# Patient Record
Sex: Female | Born: 1965 | Race: White | Hispanic: No | Marital: Married | Smoking: Never smoker
Health system: Southern US, Community
[De-identification: ages and names within clinical notes are randomized; demographics above are authoritative.]

## PROBLEM LIST (undated history)

## (undated) DIAGNOSIS — M549 Dorsalgia, unspecified: Secondary | ICD-10-CM

## (undated) DIAGNOSIS — G8929 Other chronic pain: Secondary | ICD-10-CM

## (undated) DIAGNOSIS — R011 Cardiac murmur, unspecified: Secondary | ICD-10-CM

## (undated) DIAGNOSIS — F339 Major depressive disorder, recurrent, unspecified: Secondary | ICD-10-CM

## (undated) DIAGNOSIS — F5101 Primary insomnia: Secondary | ICD-10-CM

## (undated) DIAGNOSIS — E669 Obesity, unspecified: Secondary | ICD-10-CM

## (undated) DIAGNOSIS — E785 Hyperlipidemia, unspecified: Secondary | ICD-10-CM

## (undated) DIAGNOSIS — K469 Unspecified abdominal hernia without obstruction or gangrene: Secondary | ICD-10-CM

## (undated) DIAGNOSIS — L709 Acne, unspecified: Secondary | ICD-10-CM

## (undated) DIAGNOSIS — M25569 Pain in unspecified knee: Secondary | ICD-10-CM

## (undated) DIAGNOSIS — H269 Unspecified cataract: Secondary | ICD-10-CM

## (undated) DIAGNOSIS — F411 Generalized anxiety disorder: Secondary | ICD-10-CM

## (undated) DIAGNOSIS — E119 Type 2 diabetes mellitus without complications: Secondary | ICD-10-CM

## (undated) DIAGNOSIS — I2510 Atherosclerotic heart disease of native coronary artery without angina pectoris: Secondary | ICD-10-CM

## (undated) DIAGNOSIS — M1990 Unspecified osteoarthritis, unspecified site: Secondary | ICD-10-CM

## (undated) DIAGNOSIS — K5900 Constipation, unspecified: Secondary | ICD-10-CM

## (undated) DIAGNOSIS — I10 Essential (primary) hypertension: Secondary | ICD-10-CM

## (undated) DIAGNOSIS — R7303 Prediabetes: Secondary | ICD-10-CM

## (undated) DIAGNOSIS — I209 Angina pectoris, unspecified: Secondary | ICD-10-CM

## (undated) DIAGNOSIS — Z9079 Acquired absence of other genital organ(s): Secondary | ICD-10-CM

## (undated) DIAGNOSIS — Z90710 Acquired absence of both cervix and uterus: Secondary | ICD-10-CM

## (undated) DIAGNOSIS — R0600 Dyspnea, unspecified: Secondary | ICD-10-CM

## (undated) HISTORY — PX: TONSILLECTOMY: SUR1361

## (undated) HISTORY — DX: Prediabetes: R73.03

## (undated) HISTORY — PX: HERNIA REPAIR: SHX5222

## (undated) HISTORY — DX: Dorsalgia, unspecified: M54.9

## (undated) HISTORY — DX: Acne, unspecified: L70.9

## (undated) HISTORY — DX: Unspecified abdominal hernia without obstruction or gangrene: K46.9

## (undated) HISTORY — DX: Generalized anxiety disorder: F41.1

## (undated) HISTORY — PX: PR TYMPANOSTOMY GENERAL ANESTHESIA: 69436

## (undated) HISTORY — DX: Other chronic pain: G89.29

## (undated) HISTORY — DX: Pain in unspecified knee: M25.569

## (undated) HISTORY — DX: Obesity, unspecified: E66.9

## (undated) HISTORY — PX: APPENDECTOMY: SHX54

## (undated) HISTORY — DX: Major depressive disorder, recurrent, unspecified: F33.9

## (undated) HISTORY — DX: Unspecified osteoarthritis, unspecified site: M19.90

## (undated) HISTORY — PX: PR APPENDECTOMY: 44950

## (undated) HISTORY — PX: PR LIG/TRNSXJ FLP TUBE ABDL/VAG APPR UNI/BI: 58600

## (undated) HISTORY — DX: Unspecified cataract: H26.9

## (undated) HISTORY — DX: Cardiac murmur, unspecified: R01.1

## (undated) MED ORDER — BUPROPION HCL ER (SR) 100 MG OR TB12
100.0000 mg | EXTENDED_RELEASE_TABLET | Freq: Two times a day (BID) | ORAL | 0 refills | Status: AC
Start: 2018-03-03 — End: ?

## (undated) MED ORDER — BUPROPION HCL ER (SR) 100 MG OR TB12
EXTENDED_RELEASE_TABLET | ORAL | 0 refills | Status: AC
Start: 2018-06-22 — End: ?

## (undated) MED ORDER — AMLODIPINE BESYLATE 10 MG OR TABS
ORAL_TABLET | ORAL | 0 refills | Status: AC
Start: 2023-03-23 — End: ?

## (undated) MED ORDER — ATORVASTATIN CALCIUM 40 MG OR TABS
40.0000 mg | ORAL_TABLET | Freq: Every day | ORAL | 0 refills | Status: AC
Start: 2024-04-19 — End: ?

---

## 1988-04-14 ENCOUNTER — Other Ambulatory Visit (HOSPITAL_BASED_OUTPATIENT_CLINIC_OR_DEPARTMENT_OTHER): Payer: Self-pay

## 1988-06-30 LAB — CERVICAL CANCER SCREENING

## 1988-12-15 ENCOUNTER — Other Ambulatory Visit (HOSPITAL_BASED_OUTPATIENT_CLINIC_OR_DEPARTMENT_OTHER): Payer: Self-pay

## 1988-12-24 LAB — CERVICAL CANCER SCREENING

## 1991-02-01 ENCOUNTER — Other Ambulatory Visit (HOSPITAL_BASED_OUTPATIENT_CLINIC_OR_DEPARTMENT_OTHER): Payer: Self-pay

## 1991-02-12 LAB — CERVICAL CANCER SCREENING

## 1992-02-01 ENCOUNTER — Other Ambulatory Visit (HOSPITAL_BASED_OUTPATIENT_CLINIC_OR_DEPARTMENT_OTHER): Payer: Self-pay

## 1992-02-07 LAB — CERVICAL CANCER SCREENING

## 1993-01-20 ENCOUNTER — Other Ambulatory Visit (HOSPITAL_BASED_OUTPATIENT_CLINIC_OR_DEPARTMENT_OTHER): Payer: Self-pay

## 1993-01-26 LAB — CERVICAL CANCER SCREENING

## 1994-02-16 ENCOUNTER — Other Ambulatory Visit (HOSPITAL_BASED_OUTPATIENT_CLINIC_OR_DEPARTMENT_OTHER): Payer: Self-pay

## 1994-02-25 LAB — CERVICAL CANCER SCREENING

## 1999-05-22 HISTORY — PX: EYE SURGERY: SHX253

## 2003-02-21 ENCOUNTER — Encounter: Payer: Self-pay | Admitting: Obstetrics & Gynecology

## 2003-03-13 ENCOUNTER — Ambulatory Visit: Payer: Medicaid Other

## 2003-03-13 ENCOUNTER — Encounter: Payer: Medicaid Other | Admitting: Obstetrics & Gynecology

## 2003-03-13 DIAGNOSIS — O09529 Supervision of elderly multigravida, unspecified trimester: Secondary | ICD-10-CM

## 2003-03-21 ENCOUNTER — Other Ambulatory Visit: Payer: Self-pay

## 2003-03-21 DIAGNOSIS — O09529 Supervision of elderly multigravida, unspecified trimester: Secondary | ICD-10-CM

## 2003-03-21 DIAGNOSIS — Q999 Chromosomal abnormality, unspecified: Secondary | ICD-10-CM

## 2003-03-22 ENCOUNTER — Other Ambulatory Visit (HOSPITAL_BASED_OUTPATIENT_CLINIC_OR_DEPARTMENT_OTHER): Payer: Self-pay | Admitting: MS"

## 2003-03-27 ENCOUNTER — Other Ambulatory Visit (HOSPITAL_BASED_OUTPATIENT_CLINIC_OR_DEPARTMENT_OTHER): Payer: Self-pay | Admitting: Obstetrics & Gynecology

## 2003-03-27 ENCOUNTER — Encounter: Payer: Medicare Other | Admitting: Obstetrics & Gynecology

## 2003-03-27 DIAGNOSIS — R87613 High grade squamous intraepithelial lesion on cytologic smear of cervix (HGSIL): Secondary | ICD-10-CM

## 2003-03-27 DIAGNOSIS — Z331 Pregnant state, incidental: Secondary | ICD-10-CM

## 2003-03-27 DIAGNOSIS — O09529 Supervision of elderly multigravida, unspecified trimester: Secondary | ICD-10-CM

## 2003-04-03 LAB — CYTOGENETICS

## 2003-04-17 ENCOUNTER — Encounter: Payer: Medicare Other | Admitting: Obstetrics & Gynecology

## 2003-04-17 DIAGNOSIS — O99340 Other mental disorders complicating pregnancy, unspecified trimester: Secondary | ICD-10-CM

## 2003-04-17 DIAGNOSIS — O09529 Supervision of elderly multigravida, unspecified trimester: Secondary | ICD-10-CM

## 2003-05-23 ENCOUNTER — Encounter: Payer: Medicaid Other | Admitting: Obstetrics & Gynecology

## 2003-05-23 DIAGNOSIS — IMO0002 Reserved for concepts with insufficient information to code with codable children: Secondary | ICD-10-CM

## 2003-05-23 DIAGNOSIS — O99340 Other mental disorders complicating pregnancy, unspecified trimester: Secondary | ICD-10-CM

## 2003-05-23 DIAGNOSIS — O09529 Supervision of elderly multigravida, unspecified trimester: Secondary | ICD-10-CM

## 2003-06-06 ENCOUNTER — Encounter: Payer: Self-pay | Admitting: Obstetrics & Gynecology

## 2003-06-06 ENCOUNTER — Encounter: Payer: Medicare Other | Admitting: Obstetrics & Gynecology

## 2003-06-06 ENCOUNTER — Other Ambulatory Visit: Payer: Medicare Other

## 2003-06-06 DIAGNOSIS — O3660X0 Maternal care for excessive fetal growth, unspecified trimester, not applicable or unspecified: Secondary | ICD-10-CM

## 2003-06-20 ENCOUNTER — Encounter: Payer: Medicare Other | Admitting: Obstetrics & Gynecology

## 2003-06-20 DIAGNOSIS — N879 Dysplasia of cervix uteri, unspecified: Secondary | ICD-10-CM

## 2003-06-20 DIAGNOSIS — O09529 Supervision of elderly multigravida, unspecified trimester: Secondary | ICD-10-CM

## 2003-06-24 DIAGNOSIS — R87613 High grade squamous intraepithelial lesion on cytologic smear of cervix (HGSIL): Secondary | ICD-10-CM

## 2003-06-24 DIAGNOSIS — Z331 Pregnant state, incidental: Secondary | ICD-10-CM

## 2003-06-26 ENCOUNTER — Encounter: Payer: Medicare Other | Admitting: Obstetrics & Gynecology

## 2003-06-26 DIAGNOSIS — O26839 Pregnancy related renal disease, unspecified trimester: Secondary | ICD-10-CM

## 2003-07-04 ENCOUNTER — Encounter: Payer: Medicare Other | Admitting: Obstetrics/Gynecology

## 2003-07-09 ENCOUNTER — Encounter (HOSPITAL_COMMUNITY): Payer: Medicare Other | Admitting: Maternal & Fetal Medicine

## 2003-07-09 DIAGNOSIS — O09529 Supervision of elderly multigravida, unspecified trimester: Secondary | ICD-10-CM

## 2003-07-10 ENCOUNTER — Encounter: Payer: Self-pay | Admitting: Obstetrics & Gynecology

## 2003-07-17 ENCOUNTER — Encounter: Payer: Self-pay | Admitting: Obstetrics & Gynecology

## 2003-07-26 ENCOUNTER — Encounter: Payer: Medicare Other | Admitting: Obstetrics & Gynecology

## 2003-08-07 ENCOUNTER — Encounter: Payer: Self-pay | Admitting: Obstetrics & Gynecology

## 2003-08-14 ENCOUNTER — Encounter: Payer: Self-pay | Admitting: Obstetrics & Gynecology

## 2003-08-23 ENCOUNTER — Encounter: Payer: Medicare Other | Admitting: Obstetrics/Gynecology

## 2003-08-26 ENCOUNTER — Telehealth: Payer: Medicare Other

## 2003-08-28 ENCOUNTER — Ambulatory Visit: Payer: Medicare Other | Admitting: Obstetrics & Gynecology

## 2003-08-28 ENCOUNTER — Other Ambulatory Visit (HOSPITAL_BASED_OUTPATIENT_CLINIC_OR_DEPARTMENT_OTHER): Payer: Self-pay | Admitting: Obstetrics & Gynecology

## 2003-08-28 DIAGNOSIS — R87613 High grade squamous intraepithelial lesion on cytologic smear of cervix (HGSIL): Secondary | ICD-10-CM

## 2003-08-29 ENCOUNTER — Ambulatory Visit: Payer: Medicare Other

## 2003-08-29 DIAGNOSIS — Z641 Problems related to multiparity: Secondary | ICD-10-CM

## 2003-08-29 DIAGNOSIS — Z302 Encounter for sterilization: Secondary | ICD-10-CM

## 2003-08-30 ENCOUNTER — Encounter: Payer: Self-pay | Admitting: Obstetrics & Gynecology

## 2003-09-20 ENCOUNTER — Encounter: Payer: Self-pay | Admitting: Obstetrics & Gynecology

## 2003-10-04 ENCOUNTER — Encounter: Payer: Self-pay | Admitting: Obstetrics & Gynecology

## 2003-10-16 ENCOUNTER — Encounter: Payer: Self-pay | Admitting: Obstetrics & Gynecology

## 2004-02-03 ENCOUNTER — Other Ambulatory Visit (HOSPITAL_BASED_OUTPATIENT_CLINIC_OR_DEPARTMENT_OTHER): Payer: Self-pay | Admitting: Obstetrics & Gynecology

## 2004-02-03 DIAGNOSIS — R87613 High grade squamous intraepithelial lesion on cytologic smear of cervix (HGSIL): Secondary | ICD-10-CM

## 2004-02-03 DIAGNOSIS — Z124 Encounter for screening for malignant neoplasm of cervix: Secondary | ICD-10-CM

## 2004-02-03 DIAGNOSIS — N879 Dysplasia of cervix uteri, unspecified: Secondary | ICD-10-CM

## 2004-02-04 LAB — PATHOLOGY, SURGICAL

## 2005-04-07 ENCOUNTER — Encounter: Payer: Medicare Other | Admitting: Obstetrics & Gynecology

## 2005-05-12 ENCOUNTER — Other Ambulatory Visit (HOSPITAL_BASED_OUTPATIENT_CLINIC_OR_DEPARTMENT_OTHER): Payer: Self-pay | Admitting: Obstetrics & Gynecology

## 2005-05-12 ENCOUNTER — Encounter: Payer: Medicare Other | Admitting: Obstetrics & Gynecology

## 2005-05-12 DIAGNOSIS — Z01419 Encounter for gynecological examination (general) (routine) without abnormal findings: Secondary | ICD-10-CM

## 2005-05-12 DIAGNOSIS — N72 Inflammatory disease of cervix uteri: Secondary | ICD-10-CM

## 2005-05-12 DIAGNOSIS — R87610 Atypical squamous cells of undetermined significance on cytologic smear of cervix (ASC-US): Secondary | ICD-10-CM

## 2005-05-12 DIAGNOSIS — R87810 Cervical high risk human papillomavirus (HPV) DNA test positive: Secondary | ICD-10-CM

## 2005-05-19 LAB — PATHOLOGY, SURGICAL

## 2005-05-26 LAB — CERVICAL CANCER SCREENING: Cytologic Impression: UNDETERMINED — AB

## 2005-06-09 ENCOUNTER — Encounter: Payer: Medicare Other | Admitting: Obstetrics & Gynecology

## 2005-06-09 DIAGNOSIS — R87610 Atypical squamous cells of undetermined significance on cytologic smear of cervix (ASC-US): Secondary | ICD-10-CM

## 2005-06-09 DIAGNOSIS — N946 Dysmenorrhea, unspecified: Secondary | ICD-10-CM

## 2005-06-09 DIAGNOSIS — E669 Obesity, unspecified: Secondary | ICD-10-CM

## 2005-06-09 DIAGNOSIS — N920 Excessive and frequent menstruation with regular cycle: Secondary | ICD-10-CM

## 2005-06-10 ENCOUNTER — Encounter: Payer: Medicare Other | Admitting: Registered Nurse

## 2005-08-27 ENCOUNTER — Encounter: Payer: Medicare Other | Admitting: Obstetrics & Gynecology

## 2005-08-27 ENCOUNTER — Encounter: Payer: Medicare Other | Admitting: Registered Nurse

## 2005-08-27 DIAGNOSIS — R87610 Atypical squamous cells of undetermined significance on cytologic smear of cervix (ASC-US): Secondary | ICD-10-CM

## 2005-11-01 ENCOUNTER — Encounter: Payer: Medicare Other | Admitting: Obstetrics & Gynecology

## 2005-12-06 ENCOUNTER — Encounter: Payer: Medicare Other | Admitting: Obstetrics & Gynecology

## 2005-12-06 ENCOUNTER — Other Ambulatory Visit (HOSPITAL_BASED_OUTPATIENT_CLINIC_OR_DEPARTMENT_OTHER): Payer: Self-pay | Admitting: Obstetrics & Gynecology

## 2005-12-06 DIAGNOSIS — N760 Acute vaginitis: Secondary | ICD-10-CM

## 2005-12-06 DIAGNOSIS — R87615 Unsatisfactory cytologic smear of cervix: Secondary | ICD-10-CM

## 2005-12-15 LAB — CERVICAL CANCER SCREENING: Cytologic Impression: NEGATIVE

## 2005-12-21 ENCOUNTER — Encounter: Payer: Self-pay | Admitting: Registered Nurse

## 2006-02-07 ENCOUNTER — Encounter: Payer: Medicare Other | Admitting: Registered Nurse

## 2006-02-08 ENCOUNTER — Encounter: Payer: Medicare Other | Admitting: Registered Nurse

## 2006-04-29 DIAGNOSIS — L299 Pruritus, unspecified: Secondary | ICD-10-CM

## 2006-04-29 DIAGNOSIS — R21 Rash and other nonspecific skin eruption: Secondary | ICD-10-CM

## 2006-05-09 ENCOUNTER — Encounter: Payer: Medicare Other | Admitting: Registered Nurse

## 2006-06-07 ENCOUNTER — Encounter: Payer: Medicare Other | Admitting: Registered Nurse

## 2006-06-17 ENCOUNTER — Encounter: Payer: Medicare Other | Admitting: Dermatology

## 2006-06-17 DIAGNOSIS — B86 Scabies: Secondary | ICD-10-CM

## 2006-07-01 ENCOUNTER — Encounter: Payer: Medicare Other | Admitting: Dermatology

## 2006-07-29 DIAGNOSIS — L989 Disorder of the skin and subcutaneous tissue, unspecified: Secondary | ICD-10-CM

## 2006-10-11 ENCOUNTER — Encounter (HOSPITAL_BASED_OUTPATIENT_CLINIC_OR_DEPARTMENT_OTHER): Payer: Medicare Other | Admitting: Registered Nurse

## 2006-11-29 ENCOUNTER — Encounter (HOSPITAL_BASED_OUTPATIENT_CLINIC_OR_DEPARTMENT_OTHER): Payer: Medicare Other | Admitting: Registered Nurse

## 2006-12-19 ENCOUNTER — Encounter (HOSPITAL_BASED_OUTPATIENT_CLINIC_OR_DEPARTMENT_OTHER): Payer: Medicare Other | Admitting: Registered Nurse

## 2007-02-07 ENCOUNTER — Encounter (HOSPITAL_BASED_OUTPATIENT_CLINIC_OR_DEPARTMENT_OTHER): Payer: Medicare Other | Admitting: Registered Nurse

## 2007-02-10 ENCOUNTER — Encounter (HOSPITAL_BASED_OUTPATIENT_CLINIC_OR_DEPARTMENT_OTHER): Payer: Medicare Other

## 2007-02-10 DIAGNOSIS — S86819A Strain of other muscle(s) and tendon(s) at lower leg level, unspecified leg, initial encounter: Secondary | ICD-10-CM

## 2007-02-10 DIAGNOSIS — M7650 Patellar tendinitis, unspecified knee: Secondary | ICD-10-CM

## 2007-02-10 DIAGNOSIS — M9420 Chondromalacia, unspecified site: Secondary | ICD-10-CM

## 2007-05-11 ENCOUNTER — Encounter (HOSPITAL_BASED_OUTPATIENT_CLINIC_OR_DEPARTMENT_OTHER): Payer: Medicare Other

## 2007-05-11 DIAGNOSIS — M25569 Pain in unspecified knee: Secondary | ICD-10-CM

## 2007-06-22 ENCOUNTER — Ambulatory Visit (EMERGENCY_DEPARTMENT_HOSPITAL): Payer: Medicare Other

## 2007-09-08 ENCOUNTER — Encounter (HOSPITAL_BASED_OUTPATIENT_CLINIC_OR_DEPARTMENT_OTHER): Payer: Medicare Other | Admitting: Registered Nurse

## 2007-09-08 ENCOUNTER — Other Ambulatory Visit (HOSPITAL_BASED_OUTPATIENT_CLINIC_OR_DEPARTMENT_OTHER): Payer: Self-pay | Admitting: Registered Nurse

## 2007-09-08 DIAGNOSIS — Z1231 Encounter for screening mammogram for malignant neoplasm of breast: Secondary | ICD-10-CM

## 2007-09-15 LAB — CERVICAL CANCER SCREENING: Cytologic Impression: NEGATIVE

## 2007-09-27 ENCOUNTER — Encounter (HOSPITAL_BASED_OUTPATIENT_CLINIC_OR_DEPARTMENT_OTHER): Payer: Medicare Other | Admitting: Registered Nurse

## 2007-10-03 ENCOUNTER — Encounter (HOSPITAL_BASED_OUTPATIENT_CLINIC_OR_DEPARTMENT_OTHER): Payer: Medicare Other | Admitting: Internal Medicine

## 2007-10-03 DIAGNOSIS — R059 Cough, unspecified: Secondary | ICD-10-CM

## 2007-10-31 ENCOUNTER — Encounter (HOSPITAL_BASED_OUTPATIENT_CLINIC_OR_DEPARTMENT_OTHER): Payer: Medicare Other | Admitting: Dermatology

## 2007-12-27 ENCOUNTER — Encounter (HOSPITAL_BASED_OUTPATIENT_CLINIC_OR_DEPARTMENT_OTHER): Payer: Medicare Other | Admitting: Dermatology

## 2008-03-01 ENCOUNTER — Encounter (HOSPITAL_BASED_OUTPATIENT_CLINIC_OR_DEPARTMENT_OTHER): Payer: Medicare Other

## 2008-03-01 DIAGNOSIS — L851 Acquired keratosis [keratoderma] palmaris et plantaris: Secondary | ICD-10-CM

## 2008-04-08 ENCOUNTER — Encounter (HOSPITAL_BASED_OUTPATIENT_CLINIC_OR_DEPARTMENT_OTHER): Payer: Medicare Other | Admitting: Naturopath

## 2008-04-09 ENCOUNTER — Encounter (HOSPITAL_BASED_OUTPATIENT_CLINIC_OR_DEPARTMENT_OTHER): Payer: Medicare Other | Admitting: Dermatology

## 2008-04-10 ENCOUNTER — Encounter (HOSPITAL_BASED_OUTPATIENT_CLINIC_OR_DEPARTMENT_OTHER): Payer: Self-pay | Admitting: Naturopath

## 2008-05-13 ENCOUNTER — Encounter (HOSPITAL_BASED_OUTPATIENT_CLINIC_OR_DEPARTMENT_OTHER): Payer: Medicare Other | Admitting: Naturopath

## 2008-05-13 ENCOUNTER — Ambulatory Visit (EMERGENCY_DEPARTMENT_HOSPITAL): Payer: Medicare Other

## 2008-05-22 ENCOUNTER — Encounter (HOSPITAL_BASED_OUTPATIENT_CLINIC_OR_DEPARTMENT_OTHER): Payer: Medicare Other | Admitting: Dermatology

## 2008-06-04 ENCOUNTER — Encounter (HOSPITAL_BASED_OUTPATIENT_CLINIC_OR_DEPARTMENT_OTHER): Payer: Medicare Other | Admitting: Dermatology

## 2008-06-25 ENCOUNTER — Encounter (HOSPITAL_BASED_OUTPATIENT_CLINIC_OR_DEPARTMENT_OTHER): Payer: Medicare Other | Admitting: Dermatology

## 2008-06-25 DIAGNOSIS — D235 Other benign neoplasm of skin of trunk: Secondary | ICD-10-CM

## 2008-06-25 DIAGNOSIS — L259 Unspecified contact dermatitis, unspecified cause: Secondary | ICD-10-CM

## 2008-12-09 ENCOUNTER — Encounter (HOSPITAL_BASED_OUTPATIENT_CLINIC_OR_DEPARTMENT_OTHER): Payer: Medicare Other

## 2008-12-09 DIAGNOSIS — N390 Urinary tract infection, site not specified: Secondary | ICD-10-CM

## 2009-01-21 ENCOUNTER — Encounter (HOSPITAL_BASED_OUTPATIENT_CLINIC_OR_DEPARTMENT_OTHER): Payer: Medicare Other | Admitting: Naturopath

## 2009-01-29 ENCOUNTER — Ambulatory Visit (HOSPITAL_BASED_OUTPATIENT_CLINIC_OR_DEPARTMENT_OTHER): Payer: Medicare Other | Admitting: Naturopath

## 2009-01-30 ENCOUNTER — Ambulatory Visit: Payer: Medicare Other | Attending: Naturopath | Admitting: Naturopath

## 2009-02-27 ENCOUNTER — Encounter (HOSPITAL_BASED_OUTPATIENT_CLINIC_OR_DEPARTMENT_OTHER): Payer: Medicare Other | Admitting: Naturopath

## 2009-03-10 ENCOUNTER — Encounter (HOSPITAL_BASED_OUTPATIENT_CLINIC_OR_DEPARTMENT_OTHER): Payer: Medicare Other | Admitting: Naturopath

## 2009-03-12 ENCOUNTER — Ambulatory Visit: Payer: Medicare Other | Attending: Naturopath | Admitting: Naturopath

## 2009-03-27 ENCOUNTER — Encounter (HOSPITAL_BASED_OUTPATIENT_CLINIC_OR_DEPARTMENT_OTHER): Payer: Medicare Other | Admitting: Naturopath

## 2009-04-03 ENCOUNTER — Encounter (HOSPITAL_BASED_OUTPATIENT_CLINIC_OR_DEPARTMENT_OTHER): Payer: Medicare Other | Admitting: Naturopath

## 2009-04-08 ENCOUNTER — Encounter (HOSPITAL_BASED_OUTPATIENT_CLINIC_OR_DEPARTMENT_OTHER): Payer: Medicare Other | Admitting: Naturopath

## 2009-04-10 ENCOUNTER — Encounter (HOSPITAL_BASED_OUTPATIENT_CLINIC_OR_DEPARTMENT_OTHER): Payer: Medicare Other | Admitting: Naturopath

## 2009-05-05 ENCOUNTER — Encounter (HOSPITAL_BASED_OUTPATIENT_CLINIC_OR_DEPARTMENT_OTHER): Payer: Medicare Other | Admitting: Naturopath

## 2009-05-08 ENCOUNTER — Encounter (HOSPITAL_BASED_OUTPATIENT_CLINIC_OR_DEPARTMENT_OTHER): Payer: Medicare Other | Admitting: Naturopath

## 2009-05-12 ENCOUNTER — Ambulatory Visit (HOSPITAL_BASED_OUTPATIENT_CLINIC_OR_DEPARTMENT_OTHER): Payer: Medicare Other | Admitting: Naturopath

## 2009-05-20 ENCOUNTER — Ambulatory Visit: Payer: Medicare Other | Attending: Naturopath | Admitting: Naturopath

## 2009-11-09 ENCOUNTER — Emergency Department
Admission: EM | Admit: 2009-11-09 | Discharge: 2009-11-09 | Disposition: A | Discharge status: Home / Self Care | Payer: Medicare Other | Attending: Registered Nurse | Admitting: Registered Nurse

## 2009-11-09 DIAGNOSIS — M542 Cervicalgia: Secondary | ICD-10-CM | POA: Insufficient documentation

## 2009-11-09 DIAGNOSIS — Y33XXXA Other specified events, undetermined intent, initial encounter: Secondary | ICD-10-CM | POA: Insufficient documentation

## 2009-11-09 DIAGNOSIS — S139XXA Sprain of joints and ligaments of unspecified parts of neck, initial encounter: Secondary | ICD-10-CM | POA: Insufficient documentation

## 2009-11-20 ENCOUNTER — Encounter (HOSPITAL_BASED_OUTPATIENT_CLINIC_OR_DEPARTMENT_OTHER): Payer: Medicare Other

## 2009-11-20 ENCOUNTER — Encounter (HOSPITAL_BASED_OUTPATIENT_CLINIC_OR_DEPARTMENT_OTHER): Payer: Medicare Other | Admitting: Registered Nurse

## 2009-11-20 ENCOUNTER — Emergency Department
Admission: EM | Admit: 2009-11-20 | Discharge: 2009-11-21 | Disposition: A | Discharge status: Home / Self Care | Payer: Medicare Other | Attending: Emergency Medicine | Admitting: Emergency Medicine

## 2009-11-20 DIAGNOSIS — J069 Acute upper respiratory infection, unspecified: Secondary | ICD-10-CM | POA: Insufficient documentation

## 2009-11-20 DIAGNOSIS — R059 Cough, unspecified: Secondary | ICD-10-CM | POA: Insufficient documentation

## 2009-11-20 DIAGNOSIS — H6690 Otitis media, unspecified, unspecified ear: Secondary | ICD-10-CM | POA: Insufficient documentation

## 2009-11-21 ENCOUNTER — Encounter (HOSPITAL_BASED_OUTPATIENT_CLINIC_OR_DEPARTMENT_OTHER): Payer: Medicare Other

## 2009-11-21 ENCOUNTER — Other Ambulatory Visit (HOSPITAL_BASED_OUTPATIENT_CLINIC_OR_DEPARTMENT_OTHER): Payer: Self-pay | Admitting: Emergency Medicine

## 2009-11-21 DIAGNOSIS — R059 Cough, unspecified: Secondary | ICD-10-CM

## 2009-11-21 LAB — X-RAY CHEST 2 VW

## 2009-11-27 ENCOUNTER — Encounter (HOSPITAL_BASED_OUTPATIENT_CLINIC_OR_DEPARTMENT_OTHER): Payer: Medicare Other | Admitting: Naturopath

## 2009-12-01 ENCOUNTER — Encounter (HOSPITAL_BASED_OUTPATIENT_CLINIC_OR_DEPARTMENT_OTHER): Payer: Medicare Other | Admitting: Naturopath

## 2009-12-02 ENCOUNTER — Encounter (HOSPITAL_BASED_OUTPATIENT_CLINIC_OR_DEPARTMENT_OTHER): Payer: Medicare Other | Admitting: Naturopath

## 2009-12-08 ENCOUNTER — Encounter (HOSPITAL_BASED_OUTPATIENT_CLINIC_OR_DEPARTMENT_OTHER): Payer: Medicare Other | Admitting: Naturopath

## 2009-12-11 ENCOUNTER — Encounter (HOSPITAL_BASED_OUTPATIENT_CLINIC_OR_DEPARTMENT_OTHER): Payer: Medicare Other | Admitting: Naturopath

## 2009-12-16 ENCOUNTER — Ambulatory Visit: Payer: Medicare Other | Attending: Naturopath | Admitting: Naturopath

## 2009-12-16 DIAGNOSIS — M25569 Pain in unspecified knee: Secondary | ICD-10-CM | POA: Insufficient documentation

## 2009-12-16 DIAGNOSIS — J069 Acute upper respiratory infection, unspecified: Secondary | ICD-10-CM | POA: Insufficient documentation

## 2009-12-16 DIAGNOSIS — M542 Cervicalgia: Secondary | ICD-10-CM | POA: Insufficient documentation

## 2009-12-16 DIAGNOSIS — F3289 Other specified depressive episodes: Secondary | ICD-10-CM | POA: Insufficient documentation

## 2010-03-30 ENCOUNTER — Ambulatory Visit (HOSPITAL_BASED_OUTPATIENT_CLINIC_OR_DEPARTMENT_OTHER): Admit: 2010-03-30 | Discharge: 2010-03-30 | Disposition: A | Discharge status: Home / Self Care | Payer: Self-pay

## 2010-04-02 ENCOUNTER — Ambulatory Visit (HOSPITAL_BASED_OUTPATIENT_CLINIC_OR_DEPARTMENT_OTHER): Payer: Medicare Other | Admitting: Naturopath

## 2010-04-03 ENCOUNTER — Ambulatory Visit (HOSPITAL_BASED_OUTPATIENT_CLINIC_OR_DEPARTMENT_OTHER): Payer: Medicare Other

## 2010-05-21 ENCOUNTER — Ambulatory Visit (HOSPITAL_BASED_OUTPATIENT_CLINIC_OR_DEPARTMENT_OTHER): Payer: Medicare Other

## 2010-05-21 ENCOUNTER — Ambulatory Visit (HOSPITAL_BASED_OUTPATIENT_CLINIC_OR_DEPARTMENT_OTHER): Payer: Medicare Other | Admitting: Naturopath

## 2010-06-09 ENCOUNTER — Ambulatory Visit (HOSPITAL_BASED_OUTPATIENT_CLINIC_OR_DEPARTMENT_OTHER): Payer: Medicare Other | Admitting: Naturopath

## 2010-06-09 ENCOUNTER — Ambulatory Visit (HOSPITAL_BASED_OUTPATIENT_CLINIC_OR_DEPARTMENT_OTHER): Payer: Medicare Other

## 2010-06-17 ENCOUNTER — Ambulatory Visit (HOSPITAL_BASED_OUTPATIENT_CLINIC_OR_DEPARTMENT_OTHER): Payer: Medicare Other | Admitting: Naturopath

## 2010-06-17 ENCOUNTER — Ambulatory Visit (HOSPITAL_BASED_OUTPATIENT_CLINIC_OR_DEPARTMENT_OTHER): Payer: Medicare Other

## 2010-06-18 ENCOUNTER — Ambulatory Visit: Payer: Medicare Other | Attending: Naturopath | Admitting: Naturopath

## 2010-06-18 ENCOUNTER — Other Ambulatory Visit (HOSPITAL_BASED_OUTPATIENT_CLINIC_OR_DEPARTMENT_OTHER): Payer: Self-pay | Admitting: Naturopath

## 2010-06-18 ENCOUNTER — Ambulatory Visit (HOSPITAL_BASED_OUTPATIENT_CLINIC_OR_DEPARTMENT_OTHER): Payer: Medicare Other

## 2010-06-18 DIAGNOSIS — R87810 Cervical high risk human papillomavirus (HPV) DNA test positive: Secondary | ICD-10-CM | POA: Insufficient documentation

## 2010-06-18 DIAGNOSIS — N760 Acute vaginitis: Secondary | ICD-10-CM | POA: Insufficient documentation

## 2010-06-18 DIAGNOSIS — B9689 Other specified bacterial agents as the cause of diseases classified elsewhere: Secondary | ICD-10-CM | POA: Insufficient documentation

## 2010-06-18 DIAGNOSIS — Z1231 Encounter for screening mammogram for malignant neoplasm of breast: Secondary | ICD-10-CM

## 2010-06-18 DIAGNOSIS — Z0000 Encounter for general adult medical examination without abnormal findings: Secondary | ICD-10-CM | POA: Insufficient documentation

## 2010-06-18 DIAGNOSIS — Z6841 Body Mass Index (BMI) 40.0 and over, adult: Secondary | ICD-10-CM | POA: Insufficient documentation

## 2010-06-18 DIAGNOSIS — Z01419 Encounter for gynecological examination (general) (routine) without abnormal findings: Secondary | ICD-10-CM | POA: Insufficient documentation

## 2010-06-19 LAB — CERVICAL CANCER SCREENING: Cytologic Impression: NEGATIVE

## 2010-06-24 LAB — HPV ONLY

## 2010-06-29 LAB — SCREENING MAMMOGRAPHY BILATERAL

## 2010-07-03 ENCOUNTER — Emergency Department
Admission: EM | Admit: 2010-07-03 | Discharge: 2010-07-03 | Disposition: A | Discharge status: Home / Self Care | Payer: Medicare Other | Attending: Acute Care | Admitting: Acute Care

## 2010-07-03 DIAGNOSIS — H6120 Impacted cerumen, unspecified ear: Secondary | ICD-10-CM | POA: Insufficient documentation

## 2010-07-23 ENCOUNTER — Telehealth (HOSPITAL_BASED_OUTPATIENT_CLINIC_OR_DEPARTMENT_OTHER): Payer: Self-pay

## 2010-07-23 NOTE — Telephone Encounter (Signed)
A normal test result letter was sent 06/26/2010.  Patient is aware.

## 2010-07-23 NOTE — Telephone Encounter (Signed)
Message copied by Elliot Cousin on Thu Jul 23, 2010  1:44 PM  ------       Message from: Darnelle Spangle E       Created: Thu Jul 23, 2010 12:57 PM       Regarding: c/keehn....FW: C/KEEHN       Contact: [phone]                       ----- Message -----          From: Lonn Georgia He          Sent: 07/23/2010  12:45 PM            To: Marquis Lunch [facility] Pss Pool       Subject: C/KEEHN                                                    CONFIRMED PHONE NUMBER: [phone]       CALLERS FIRST AND LAST NAME: Carolanne Grumbling       FACILITY NAME: na TITLE: na       CALLERS RELATIONSHIP:Self       RETURN CALL: Detailed message on voicemail only              SUBJECT: General Message        REASON FOR REQUEST: Patient is calling to find out about the letter that was sent to her. She states there is a Psychiatric nurse that is being held at her local post office, from [facility]. [facility] is the only clinic she is a patient at. Before she goes to pick it up, she would like to know what it contains. Thank you. -Maggie x [phone]              MESSAGE: See above       FORWARD TO: [facility]

## 2010-08-25 ENCOUNTER — Telehealth (HOSPITAL_BASED_OUTPATIENT_CLINIC_OR_DEPARTMENT_OTHER): Payer: Self-pay

## 2010-08-25 NOTE — Telephone Encounter (Signed)
Message copied by Elliot Cousin on Tue Aug 25, 2010  9:21 AM  ------       Message from: Celedonio Savage       Created: Mon Aug 24, 2010  4:38 PM       Regarding: [name] FW: [facility]/keehn/medication        Contact: [phone]                       ----- Message -----          From: Tamala Ser          Sent: 08/24/2010   4:17 PM            To: [name] Rn Pool       Subject: [facility]/keehn/medication                CONFIRMED PHONE NUMBER: Telephone Information:       Home Phone      [phone]       Work Phone      [phone]       Mobile          [phone]       Work Phone      [phone]       CALLERS FIRST AND LAST NAME: Holly Blair       FACILITY NAME: n/a TITLE: n/a       CALLERS RELATIONSHIP:Self       RETURN CALL: Detailed message on voicemail only              SUBJECT: General Message        REASON FOR REQUEST: refill              MESSAGE: patient needs a refill of Prozac to be sent to the pharmacy at [facility], ph: [phone]       FORWARD TO: Carney Bern

## 2010-08-25 NOTE — Telephone Encounter (Signed)
Holly Blair takes prozac 30 mg daily. She has been on this dose for one year and it is working well. Last PHV was 06/18/10 with Holly Blair.  It is confusing to me whether she is on prozac 30 mg daily or prozac 20 mg daily. Holly Blair's note form 11/2009 says prozac 20 mg daily and not from 05/2010 says 30 mg daily.  [facility] says she picked up 20 mg daily on June 2011. Holly Blair says she takes one pill daily.  Holly Blair will check her pill bottle when she gets home and give me a call.  Pharmacy confirmed.

## 2010-08-26 MED ORDER — FLUOXETINE HCL 20 MG OR CAPS
ORAL_CAPSULE | ORAL | Status: DC
Start: 2010-08-26 — End: 2010-08-26

## 2010-08-26 MED ORDER — FLUOXETINE HCL 20 MG OR CAPS
ORAL_CAPSULE | ORAL | Status: DC
Start: 2010-08-26 — End: 2011-11-11

## 2010-08-26 NOTE — Telephone Encounter (Signed)
Patient is aware that Rx is at her pharmacy.  She will call if she has any concerns.

## 2010-08-26 NOTE — Telephone Encounter (Signed)
Pt called back and states she is taking 20mg  of Prozac q day. Will forward to MD for refill approval.

## 2010-08-28 ENCOUNTER — Telehealth (HOSPITAL_BASED_OUTPATIENT_CLINIC_OR_DEPARTMENT_OTHER): Payer: Self-pay

## 2010-08-28 NOTE — Telephone Encounter (Signed)
Left a message for the patient to call [phone] to schedule imaging.

## 2010-08-28 NOTE — Telephone Encounter (Signed)
Monica at [facility] is calling to report that the patient is overdue for repeat imaging. She had a screening mammogram 06/18/10 and was supposed to have repeat imaging of left breast. They have not been able to reach the patient.  [phone] the number is always busy.  [phone]- she has not worked there in over a year.  Left a message on [phone] to return my call.

## 2010-09-01 NOTE — Telephone Encounter (Signed)
Message left for patient to call [phone] to schedule imaging.

## 2010-09-08 ENCOUNTER — Other Ambulatory Visit (HOSPITAL_BASED_OUTPATIENT_CLINIC_OR_DEPARTMENT_OTHER): Payer: Self-pay

## 2010-09-08 ENCOUNTER — Encounter (HOSPITAL_BASED_OUTPATIENT_CLINIC_OR_DEPARTMENT_OTHER): Payer: Self-pay

## 2010-09-15 ENCOUNTER — Ambulatory Visit (HOSPITAL_BASED_OUTPATIENT_CLINIC_OR_DEPARTMENT_OTHER): Payer: Medicare Other

## 2010-09-15 ENCOUNTER — Ambulatory Visit: Payer: Medicare Other

## 2010-09-15 ENCOUNTER — Other Ambulatory Visit (HOSPITAL_BASED_OUTPATIENT_CLINIC_OR_DEPARTMENT_OTHER): Payer: Self-pay | Admitting: Naturopath

## 2010-09-15 LAB — MAMMOGRAPHY TECHNICAL REPEAT RR

## 2010-10-13 ENCOUNTER — Encounter (HOSPITAL_BASED_OUTPATIENT_CLINIC_OR_DEPARTMENT_OTHER): Payer: Medicare Other | Admitting: Naturopath

## 2010-10-13 ENCOUNTER — Telehealth (HOSPITAL_BASED_OUTPATIENT_CLINIC_OR_DEPARTMENT_OTHER): Payer: Self-pay

## 2010-10-13 NOTE — Telephone Encounter (Signed)
Message copied by Elliot Cousin on Tue Oct 13, 2010  2:14 PM  ------       Message from: Celedonio Savage       Created: Tue Oct 13, 2010  1:02 PM       Regarding: [name]  [facility] Triage       Contact: [phone]                       ----- Message -----          From: Ritta Slot          Sent: 10/13/2010  12:53 PM            To: [name] Rn Pool       Subject: [facility] Keehn Triage                                          CONFIRMED PHONE NUMBER: [phone]       CALLERS FIRST AND LAST NAME: Holly Blair        FACILITY NAME: n/a TITLE: n/a       CALLERS RELATIONSHIP:Self       RETURN CALL: General message OK              SUBJECT: General Message        REASON FOR REQUEST: requesting call from RN              MESSAGE: patient states that her feet are swollen and she finds it hard to walk. Patient would like to speak to a nurse       FORWARD TO: RN

## 2010-10-13 NOTE — Telephone Encounter (Signed)
Holly Blair reports feet swelling and pain with walking since last night. She reports that she eats a low salt diet. She denies difficulty breathing. She takes prozac and ibuprofen [for knee pain].  Appointment today for evaluation.

## 2010-10-15 ENCOUNTER — Encounter (HOSPITAL_BASED_OUTPATIENT_CLINIC_OR_DEPARTMENT_OTHER): Payer: Medicare Other | Admitting: Naturopath

## 2011-04-26 ENCOUNTER — Telehealth (HOSPITAL_BASED_OUTPATIENT_CLINIC_OR_DEPARTMENT_OTHER): Payer: Self-pay | Admitting: Naturopath

## 2011-04-26 NOTE — Telephone Encounter (Signed)
Message copied by [name] on Mon Apr 26, 2011  4:29 PM  ------       Message from: LEDDO, CHRISTINE C       Created: Mon Apr 26, 2011  4:20 PM       Regarding: C/Keehn-FW: [facility] RN MEDICATION QUESTIONS       Contact: [phone]                       ----- Message -----          From: [name]          Sent: 04/26/2011   3:50 PM            To: [name] Rn Pool       Subject: [facility] RN MEDICATION QUESTIONS                               CONFIRMED PHONE NUMBER: Telephone Information:       Home Phone      [phone]       Work Phone      [phone]       Mobile          [phone]       Work Phone      [phone]              CALLERS FIRST AND LAST NAME: Holly Blair       FACILITY NAME:   TITLE:         CALLERS RELATIONSHIP:Self       RETURN CALL: General message OK              SUBJECT: General Message        REASON FOR REQUEST: medication question              MESSAGE: patient wants to speak with a nurse regarding her current medications.

## 2011-04-26 NOTE — Telephone Encounter (Signed)
Pt states she moved and transferred her prescriptions but the new pharmacy does not now have them on record.  She would like a refill of her Ibuprofen and Tylenol that she uses for pain in her knee and with her periods.    Pt uses the [facility] in [place] on [address].

## 2011-04-27 MED ORDER — ACETAMINOPHEN 500 MG OR TABS
ORAL_TABLET | ORAL | Status: DC
Start: 2011-04-26 — End: 2015-04-24

## 2011-04-27 MED ORDER — IBUPROFEN 600 MG OR TABS
ORAL_TABLET | ORAL | Status: DC
Start: 2011-04-26 — End: 2011-10-11

## 2011-04-27 NOTE — Telephone Encounter (Signed)
Patient is aware 

## 2011-04-27 NOTE — Telephone Encounter (Signed)
Requested to refill one of my partner's patients medications while they are out of clinic. Chart has been reviewed and medication refill is appropriate for the current management without interruption of care until PCP is back in clinic. No contraindications or drug interactions. Patient should call the clinic with any complications or side effects and will follow-up for regular evaluation with PCP.

## 2011-06-18 ENCOUNTER — Ambulatory Visit (HOSPITAL_BASED_OUTPATIENT_CLINIC_OR_DEPARTMENT_OTHER): Payer: Medicare Other | Admitting: Naturopath

## 2011-06-23 ENCOUNTER — Ambulatory Visit (HOSPITAL_BASED_OUTPATIENT_CLINIC_OR_DEPARTMENT_OTHER): Payer: Medicare Other | Admitting: Naturopath

## 2011-06-25 ENCOUNTER — Ambulatory Visit: Payer: Medicare Other | Attending: Naturopath | Admitting: Naturopath

## 2011-06-25 ENCOUNTER — Encounter (HOSPITAL_BASED_OUTPATIENT_CLINIC_OR_DEPARTMENT_OTHER): Payer: Self-pay | Admitting: Naturopath

## 2011-06-25 VITALS — BP 128/58 | HR 60 | Wt 365.6 lb

## 2011-06-25 DIAGNOSIS — Z13220 Encounter for screening for lipoid disorders: Secondary | ICD-10-CM

## 2011-06-25 DIAGNOSIS — Z0000 Encounter for general adult medical examination without abnormal findings: Secondary | ICD-10-CM

## 2011-06-25 DIAGNOSIS — Z1151 Encounter for screening for human papillomavirus (HPV): Secondary | ICD-10-CM | POA: Insufficient documentation

## 2011-06-25 DIAGNOSIS — Z1329 Encounter for screening for other suspected endocrine disorder: Secondary | ICD-10-CM

## 2011-06-25 DIAGNOSIS — Z01419 Encounter for gynecological examination (general) (routine) without abnormal findings: Secondary | ICD-10-CM | POA: Insufficient documentation

## 2011-06-25 DIAGNOSIS — Z131 Encounter for screening for diabetes mellitus: Secondary | ICD-10-CM

## 2011-06-28 ENCOUNTER — Encounter (HOSPITAL_BASED_OUTPATIENT_CLINIC_OR_DEPARTMENT_OTHER): Payer: Self-pay | Admitting: Naturopath

## 2011-06-28 DIAGNOSIS — F339 Major depressive disorder, recurrent, unspecified: Secondary | ICD-10-CM | POA: Insufficient documentation

## 2011-06-28 DIAGNOSIS — M549 Dorsalgia, unspecified: Secondary | ICD-10-CM | POA: Insufficient documentation

## 2011-06-28 DIAGNOSIS — L709 Acne, unspecified: Secondary | ICD-10-CM | POA: Insufficient documentation

## 2011-06-28 DIAGNOSIS — M25569 Pain in unspecified knee: Secondary | ICD-10-CM | POA: Insufficient documentation

## 2011-06-28 NOTE — Addendum Note (Signed)
Addended by: Theresa Duty on: 06/28/2011 01:57 PM     Modules accepted: Orders

## 2011-06-28 NOTE — Progress Notes (Signed)
SUBJECTIVE:    Holly Blair is a 46 year old female here today for a preventive health visit.     Other problems or concerns today: none    Patient Active Problem List   Diagnoses   . Depression, major, recurrent   . Acne   . Obesity   . Chronic knee pain   . Chronic back pain       Past Medical History   Diagnosis Date   . Depression, major, recurrent    . Acne    . Obesity    . Chronic knee pain    . Chronic back pain        Past Surgical History   Procedure Date   . Ligate fallopian tube        Current Outpatient Prescriptions   Medication Sig   . Acetaminophen 500 MG Oral Tab Take 1 tablet by mouth every 6 hours if needed to relieve pain   . Fluoxetine HCl 20 MG Oral Cap Take 1 capsule by mouth daily   . Ibuprofen 600 MG Oral Tab Take 1 tablet by mouth every 6 hours if needed for pain/inflammation. Take with food.       Review of patient's allergies indicates:  No Known Allergies    Health Maintenance   Topic Date Due   . Influenza > 6 Months  03/21/2012   . Tetanus Booster  06/27/2012   . Pap Smear 30-64 Years  06/27/2014       SOCIAL HISTORY  Holly Blair is currently living in [place] with a roommate.  She is unemployed and looking for a job.  She recently had a new granddaughter born. She denies any new or particulular stress.      FAMILY HISTORY  Family History   Problem Relation Age of Onset   . Hypertension Maternal Grandfather    . Cancer Mother      lung cancer, long history of smkoing       GYN HISTORY  Obstetric History    G0   P0   T0   P0   A0   TAB0   SAB0   E0   M0   L0      LMP: 06/10/2011   Menses: menses regular every 30 days  Last pap: 2011  Pap history: mild atypia in 2006, normal pap with + HPV in 2011  Other gyn history: none    SEXUAL HISTORY  Sexual activity: not at present  History of STD: none known  Sexual concerns: No    NUTRITION  Current dietary habits: eats most of her food from food stamps and food bank.  Tries to eat mainly vegetables and grains  Calcium: dairy, cheese,  yogurt    EXERCISE  Current exercise habits: yes, engages in regular exercise and walking daily - low intensity     HABITS  Smoker: NO  Alcohol: NO  Recreational Drug Use: NO    SAFETY  History of sexual or physical abuse: No  Safe in current living space: YES  Regular seat belt use: YES  Stress: Yes: related to housing    REVIEW OF SYSTEMS  Mood table on medication. Complete 14 system review is negative today.    OBJECTIVE:  BP 128/58  Pulse 60  Wt 365 lb 9.6 oz (165.835 kg)  LMP 06/10/2011  There is no height on file to calculate BMI.  General appearance: healthy, alert, no distress  Mood/affect: Pleasant female. Mood and affect appropriate.  Skin: Skin color, texture, turgor normal. No rashes or concerning lesions  HEENT:Ear:  External ears normal. Canals clear. TM's normal. and Throat:  oropharynx w/o lesions, erythema, exudate; MMM and no tonsillar enlargement  Neck: supple. No adenopathy. Thyroid symmetric, normal size, without nodules  Respiratory: Respirations even and unlabored. Clear lung fields without crackles, wheezing or expiratory prolongation  Cardiovascular: S1 and S2 present. RRR without murmurs.  Abdomen: soft, non-tender. BS normal. No masses or organomegaly  Breasts: No obvious deformity or mass to inspection, nipples everted bilaterally, no skin lesion or nipple discharge, no mass palpated, no axillary lymphadenopathy  Pelvic: External genitalia normal, normal bartholin/skene/urethral meatus/anus., Vagina is rugated and well-estrogenized, cervix normal in appearance, no CMT, no bladder tenderness.  Uterus and adnexa not palpable to large amounts of abdominal adipose tissure      ASSESSMENT/PLAN:    Annual Preventative Health Exam.  Health Care Maintenance:  Screening Labs: lipids, CMP, TSH  Mammogram scheduled   Pap collected: YES  STD Check:  NO declined  Additional Recommendations: increase walking and exercise, discussion and recommendations on weight loss   Preventive counseling: health  maintenance  immunizations  diet rich in 3 omega fatty acids and low in saturated fats  adequate calcium intake  vitamin D supplementation  proper exercise  Follow-up: 1 year

## 2011-06-29 ENCOUNTER — Other Ambulatory Visit (HOSPITAL_BASED_OUTPATIENT_CLINIC_OR_DEPARTMENT_OTHER): Payer: Self-pay | Admitting: Naturopath

## 2011-06-29 ENCOUNTER — Telehealth (HOSPITAL_BASED_OUTPATIENT_CLINIC_OR_DEPARTMENT_OTHER): Payer: Self-pay

## 2011-06-29 DIAGNOSIS — Z1329 Encounter for screening for other suspected endocrine disorder: Secondary | ICD-10-CM

## 2011-06-29 DIAGNOSIS — Z131 Encounter for screening for diabetes mellitus: Secondary | ICD-10-CM

## 2011-06-29 DIAGNOSIS — Z13220 Encounter for screening for lipoid disorders: Secondary | ICD-10-CM

## 2011-06-29 LAB — CERVICAL CANCER SCREENING: Cytologic Impression: NEGATIVE

## 2011-06-29 NOTE — Telephone Encounter (Signed)
Message copied by Elliot Cousin on Tue Jun 29, 2011  9:25 AM  ------       Message from: Pershing Proud       Created: Tue Jun 29, 2011  8:34 AM       Regarding: c/keehn       Contact: [phone]         CONFIRMED PHONE [phone]       CALLERS RELATIONSHIP:Self       RETURN CALL: General message OK              Pt needs a referral to the eye clinic/please call

## 2011-06-29 NOTE — Telephone Encounter (Signed)
Message from [name] [place] sent at 06/29/2011 3:57 PM -----   She does not need a referral for a routine eye exam and I don't believe there is any other specific reason to refer her. She should be able to call and schedule an appointment.   Message left to return my call.

## 2011-06-29 NOTE — Telephone Encounter (Signed)
Shellee is requesting a referral to the [facility] for an eye exam.

## 2011-06-30 NOTE — Telephone Encounter (Signed)
The patient is aware.

## 2011-07-01 LAB — HPV ONLY

## 2011-07-02 ENCOUNTER — Ambulatory Visit (HOSPITAL_BASED_OUTPATIENT_CLINIC_OR_DEPARTMENT_OTHER): Payer: Medicare Other

## 2011-07-05 ENCOUNTER — Ambulatory Visit: Payer: Medicare Other | Attending: Naturopath

## 2011-07-05 ENCOUNTER — Other Ambulatory Visit (HOSPITAL_BASED_OUTPATIENT_CLINIC_OR_DEPARTMENT_OTHER): Payer: Self-pay | Admitting: Naturopath

## 2011-07-05 DIAGNOSIS — Z1231 Encounter for screening mammogram for malignant neoplasm of breast: Secondary | ICD-10-CM | POA: Insufficient documentation

## 2011-07-05 LAB — SCREENING MAMMOGRAPHY BILATERAL

## 2011-07-13 ENCOUNTER — Ambulatory Visit: Payer: Medicare Other | Attending: Ophthalmology | Admitting: Ophthalmology

## 2011-07-13 DIAGNOSIS — Z13220 Encounter for screening for lipoid disorders: Secondary | ICD-10-CM | POA: Insufficient documentation

## 2011-07-13 DIAGNOSIS — Z131 Encounter for screening for diabetes mellitus: Secondary | ICD-10-CM | POA: Insufficient documentation

## 2011-07-13 DIAGNOSIS — Z1329 Encounter for screening for other suspected endocrine disorder: Secondary | ICD-10-CM | POA: Insufficient documentation

## 2011-07-13 DIAGNOSIS — H5210 Myopia, unspecified eye: Secondary | ICD-10-CM

## 2011-07-13 DIAGNOSIS — H2510 Age-related nuclear cataract, unspecified eye: Secondary | ICD-10-CM | POA: Insufficient documentation

## 2011-07-13 LAB — COMPREHENSIVE METABOLIC PANEL
ALT (GPT): 18 U/L (ref 6–40)
AST (GOT): 18 U/L (ref 15–40)
Albumin: 4 g/dL (ref 3.5–5.2)
Alkaline Phosphatase (Total): 68 U/L (ref 25–112)
Anion Gap: 8 (ref 3–11)
Bilirubin (Total): 0.7 mg/dL (ref 0.2–1.3)
Calcium: 9.3 mg/dL (ref 8.9–10.2)
Carbon Dioxide, Total: 29 mEq/L (ref 22–32)
Chloride: 101 mEq/L (ref 98–108)
Creatinine: 0.74 mg/dL (ref 0.38–1.02)
GFR, Calc, African American: >60 mL/min (ref >59)
GFR, Calc, European American: >60 mL/min (ref >59)
Glucose: 102 mg/dL (ref 62–125)
Potassium: 4 mEq/L (ref 3.7–5.2)
Protein (Total): 7.5 g/dL (ref 6.0–8.2)
Sodium: 138 mEq/L (ref 136–145)
Urea Nitrogen: 7 mg/dL — ABNORMAL LOW (ref 8–21)

## 2011-07-13 LAB — LIPID PANEL
Cholesterol (LDL): 137 mg/dL — ABNORMAL HIGH (ref <130)
Cholesterol/HDL Ratio: 4
HDL Cholesterol: 53 mg/dL (ref >40)
Non-HDL Cholesterol: 158 mg/dL (ref 0–159)
Total Cholesterol: 211 mg/dL — ABNORMAL HIGH (ref <200)
Triglyceride: 106 mg/dL (ref <150)

## 2011-07-13 LAB — THYROID STIMULATING HORMONE: Thyroid Stimulating Hormone: 2.723 u[IU]/mL (ref 0.400–5.000)

## 2011-07-13 NOTE — Progress Notes (Signed)
Attending note:    I saw and evaluated this patient and I have reviewed the resident's note.  I agree with the findings and plan as documented.  Exceptions/additions: as noted:    EYE EXAM:  See above.  NOTE:  I have personally checked confrontation visual field testing and ocular motility of the patient today and have edited these sections in the "Base Ophthalmology Exam" section as necessary.      Assessment/Plan:  1.  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  --Observe.    2.  Myopia, astigmatism, early presbyopia  --pt not bothered enough by near vision to consider bifocals/PALs at this time, would like to continue using SVL's for now  --Prescription for glasses dispensed today     RTC 2 years or sooner PRN    Marilynne Halsted, MD

## 2011-07-13 NOTE — Progress Notes (Signed)
Chief complaint:   Chief Complaint   Patient presents with   . New Patient     here for eye exam, would like new glasses.   . Blurred Vision       HPI:  Holly Blair is a 46 year old female here for eye exam.  Would like new glasses for distance as her current glasses are bent.  However, pt denies vision problems or blurring at distance or at near.    ROS: All systems reviewed, negative except as noted above and per HPI.    POcHx:   myopia  OcMeds:   none  PMHx:    Past Medical History   Diagnosis Date   . Depression, major, recurrent    . Acne    . Obesity    . Chronic knee pain    . Chronic back pain       No CAD/MI, DM; no problems with anesthesia   Meds:    Outpatient Prescriptions Prior to Visit   Medication Sig Dispense Refill   . Acetaminophen 500 MG Oral Tab Take 1 tablet by mouth every 6 hours if needed to relieve pain  60 Tab  0   . Fluoxetine HCl 20 MG Oral Cap Take 1 capsule by mouth daily  60 Cap  3   . Ibuprofen 600 MG Oral Tab Take 1 tablet by mouth every 6 hours if needed for pain/inflammation. Take with food.  90 Tab  0       ALL:    Review of patient's allergies indicates:  No Known Allergies    FamHx:  Family History   Problem Relation Age of Onset   . Hypertension Maternal Grandfather    . Cancer Mother      lung cancer, long history of smkoing     Mother with refractive error  Denies age-related macular degeneration, glaucoma     SocHx:   History     Social History   . Marital Status: Single     Spouse Name: N/A     Number of Children: N/A   . Years of Education: N/A     Occupational History   . Not on file.     Social History Main Topics   . Smoking status: Never Smoker    . Smokeless tobacco: Never Used   . Alcohol Use:    . Drug Use:    . Sexually Active:      Other Topics Concern   . Not on file     Social History Narrative   . No narrative on file       PHYSICAL EXAM   Eyes: See Eye Exam   Constitutional: Well-appearing  Psychiatric: Mood normal   Neurologic: Face is symmetric      Skin: Facial rashes:No   Head: Atraumatic   ENT: External ears and nose are normal in appearance .   Respiratory:normal respiratory effort     A/P:    1. Myopia and presbyopia both eyes  - MRx dispensed  - Recommend bifocals or PAL's as pt is becoming presbyopic    2. Cataracts, not yet visually significant  - follow      Edwyna Shell, MD  Ophthalmology Resident, R2  [facility]    Referring:        PCP:    Susa Loffler, [place]

## 2011-07-14 ENCOUNTER — Telehealth (HOSPITAL_BASED_OUTPATIENT_CLINIC_OR_DEPARTMENT_OTHER): Payer: Self-pay

## 2011-07-14 NOTE — Telephone Encounter (Signed)
Holly Blair is calling for test results. Will have [name] review tests.

## 2011-07-15 ENCOUNTER — Encounter (HOSPITAL_BASED_OUTPATIENT_CLINIC_OR_DEPARTMENT_OTHER): Payer: Self-pay

## 2011-07-15 NOTE — Telephone Encounter (Signed)
Holly Blair has verbalized understanding and agrees with plan.  She needs a letter from [employer] stating that she has a learning disability. 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  She will come in for a visit on 07/22/11 to request the letter.

## 2011-07-15 NOTE — Telephone Encounter (Signed)
Please call patient and let her know she should be getting a copy of the results in the mail.  I reviewed the results and the only slight abnormality is her cholesterol is mildly elevated, no need for medications.  My recommendations:   1. Consume a diet low in saturated fat (found in red meat, pork, butter, fried foods, and full fat dairy products).  Increase fresh fruits and vegetables and high fiber foods.  2.  Exercise at a moderate level of intensity for at least   30 minutes, 4 or more days per week.  3. Omega 3 Fatty Acids (Fish Oil)  as a daily supplement is beneficial in reducing risk of coronary disease.  I recommend 1000 mg twice daily.  4. Consider an additional fiber supplement.  My favorite is 1-2 Tbs ground flax seeds added to smoothies, salad, stir fry, yogurt, or oatmeal.  You could also use Metamucil or Benefiber.   5. Recheck fasting lipid profile at your next annual.

## 2011-07-15 NOTE — Telephone Encounter (Signed)
Letter sent to patient re: results

## 2011-07-22 ENCOUNTER — Encounter (HOSPITAL_BASED_OUTPATIENT_CLINIC_OR_DEPARTMENT_OTHER): Payer: Medicare Other | Admitting: Naturopath

## 2011-07-27 ENCOUNTER — Encounter (HOSPITAL_BASED_OUTPATIENT_CLINIC_OR_DEPARTMENT_OTHER): Payer: Medicare Other | Admitting: Naturopath

## 2011-08-04 ENCOUNTER — Telehealth (HOSPITAL_BASED_OUTPATIENT_CLINIC_OR_DEPARTMENT_OTHER): Payer: Self-pay

## 2011-08-04 MED ORDER — FLUOXETINE HCL 20 MG OR CAPS
ORAL_CAPSULE | ORAL | Status: DC
Start: 2011-08-04 — End: 2012-04-25

## 2011-08-04 NOTE — Telephone Encounter (Signed)
Message copied by Elliot Cousin on Wed Aug 04, 2011 10:44 AM  ------       Message from: Roque Lias       Created: Wed Aug 04, 2011 10:35 AM       Regarding: C/Keehn       Contact: [phone]         CONFIRMED PHONE NUMBER: [phone]              CALLERS RELATIONSHIP:Self       RETURN CALL: General message OK              SUBJECT: General Message        REASON FOR REQUEST: Need Rx              MESSAGE: Pt saw Elan last 01/04 and ask for refill for her Fluoxetine HCl 20 MG Oral Cap but pt said Elan did not send Rx now pt is calling if Elan could send Rx for Fluoxetine HCl 20 MG Oral Cap to [facility] Pharmacy at [place] [facility]. [phone].

## 2011-08-04 NOTE — Telephone Encounter (Signed)
Holly Blair is aware

## 2011-08-04 NOTE — Telephone Encounter (Signed)
Please call patient.  Prescription filled.

## 2011-10-11 ENCOUNTER — Other Ambulatory Visit: Payer: Self-pay | Admitting: Naturopath

## 2011-10-11 MED ORDER — IBUPROFEN 600 MG OR TABS
ORAL_TABLET | ORAL | Status: DC
Start: 2011-10-11 — End: 2013-04-28

## 2011-10-11 NOTE — Telephone Encounter (Signed)
Request from Pharmacy/Patient: RA Monroe  Last filled:11/12  Last apt: 1/13  Next apt: none  RTC:  1 yr  Ibuprofen 600 MG Oral Tab 90 Tab 0 04/26/2011 Sig - Route: Take 1 tablet by mouth every 6 hours if needed for pain/inflammation. Take with food. - Oral Class: Joaquim Nam 04/26/2011 4:37 PM Signed   Pt states she moved and transferred her prescriptions but the new pharmacy does not now have them on record. She would like a refill of her Ibuprofen and Tylenol that she uses for pain in her knee and with her periods.     Medication not addressed at annual, not sure how often pt is using  Comment:Rf x 3

## 2011-10-14 ENCOUNTER — Telehealth (HOSPITAL_BASED_OUTPATIENT_CLINIC_OR_DEPARTMENT_OTHER): Payer: Self-pay

## 2011-10-14 NOTE — Telephone Encounter (Signed)
Message copied by [name] on Thu Oct 14, 2011  4:04 PM  ------       Message from: Mills Koller       Created: Thu Oct 14, 2011  2:25 PM       Regarding: c/keehn       Contact: [phone]                                     CONFIRMED PHONE NUMBER: [phone]       CALLERS RELATIONSHIP:Self       RETURN CALL: General message OK              SUBJECT: General Message        REASON FOR REQUEST: pt reports sx of awaking 2x per night and hot flashes at various times during the day and night. Pt would like to know if this is the start of menopause

## 2011-10-14 NOTE — Telephone Encounter (Signed)
Voicemail left for pt letting her know her symptoms could be indicitive of menopause.  She is advised to make an apt with provider if she has questions or is having problems with the symptoms.

## 2011-10-15 ENCOUNTER — Telehealth (HOSPITAL_BASED_OUTPATIENT_CLINIC_OR_DEPARTMENT_OTHER): Payer: Self-pay

## 2011-10-15 ENCOUNTER — Encounter (HOSPITAL_BASED_OUTPATIENT_CLINIC_OR_DEPARTMENT_OTHER): Payer: Medicare Other | Admitting: Naturopath

## 2011-10-15 ENCOUNTER — Encounter (HOSPITAL_BASED_OUTPATIENT_CLINIC_OR_DEPARTMENT_OTHER): Payer: Self-pay | Admitting: Naturopath

## 2011-10-15 NOTE — Telephone Encounter (Signed)
Contacted pt's pharmacy. They state that patient's rx is ready.

## 2011-10-15 NOTE — Telephone Encounter (Signed)
Message copied by Hansel Starling on Fri Oct 15, 2011  1:08 PM  ------       Message from: Rocky Morel A       Created: Fri Oct 15, 2011 12:46 PM       Regarding: c/Keehn       Contact: [phone]         Forwarded to team c       ----- Message -----          From: Earlyne Iba          Sent: 10/15/2011  12:35 PM            To: [name] Rn Pool       Subject: [facility]/ARNP ELAN KEE#              CONFIRMED PHONE NUMBER: [phone]       CALLERS FIRST AND LAST NAME: Honesti Wieseler       FACILITY NAME: NA TITLE: NA       CALLERS RELATIONSHIP:Self       RETURN CALL: OK to leave detailed message with anyone that answers              SUBJECT: General Message        REASON FOR REQUEST: MEDICATION              MESSAGE: Pt's said [name] was to have called, the [facility]ac [facility] on [address], and they still haven't received it. [facility] call [facility] at [phone] and pt would like a call once this has been done.

## 2011-10-19 ENCOUNTER — Encounter (HOSPITAL_BASED_OUTPATIENT_CLINIC_OR_DEPARTMENT_OTHER): Payer: Medicare Other | Admitting: Naturopath

## 2011-10-26 ENCOUNTER — Encounter (HOSPITAL_BASED_OUTPATIENT_CLINIC_OR_DEPARTMENT_OTHER): Payer: Medicare Other | Admitting: Naturopath

## 2011-10-28 ENCOUNTER — Encounter (HOSPITAL_BASED_OUTPATIENT_CLINIC_OR_DEPARTMENT_OTHER): Payer: Medicare Other | Admitting: Naturopath

## 2011-10-29 ENCOUNTER — Encounter (HOSPITAL_BASED_OUTPATIENT_CLINIC_OR_DEPARTMENT_OTHER): Payer: Medicare Other | Admitting: Naturopath

## 2011-11-03 ENCOUNTER — Encounter (HOSPITAL_BASED_OUTPATIENT_CLINIC_OR_DEPARTMENT_OTHER): Payer: Self-pay | Admitting: Naturopath

## 2011-11-03 ENCOUNTER — Ambulatory Visit: Payer: Medicare Other | Attending: Naturopath | Admitting: Naturopath

## 2011-11-03 VITALS — BP 135/85 | HR 86 | Wt 356.6 lb

## 2011-11-03 DIAGNOSIS — Z0271 Encounter for disability determination: Secondary | ICD-10-CM | POA: Insufficient documentation

## 2011-11-03 NOTE — Progress Notes (Signed)
SUBJECTIVE:   Holly Blair is a 46 year old female patient who presents with the following concern:     Chief Complaint: Disability paperwork  She was 15 minutes late for 20 minute visit and will reschedule appointment to discuss sleep, hot flashes, and hormone questions.     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  She needs paperwork to be filled out for disability in to relation to child support.        Patient Active Problem List   Diagnosis   . Depression, major, recurrent   . Acne   . Obesity   . Chronic knee pain   . Chronic back pain   . Senile nuclear cataract   . Myopia with astigmatism and presbyopia       Current Outpatient Prescriptions   Medication Sig   . Acetaminophen 500 MG Oral Tab Take 1 tablet by mouth every 6 hours if needed to relieve pain   . Fluoxetine HCl 20 MG Oral Cap Take 1 capsule by mouth daily   . Fluoxetine HCl 20 MG Oral Cap Take 1 capsule by mouth daily   . Ibuprofen 600 MG Oral Tab Take 1 tablet by mouth every 6-8 hours if needed for pain/inflammation. Take with food.       Review of patient's allergies indicates:  No Known Allergies    OBJECTIVE:  General: Alert, no acute distress   BP 135/85  Pulse 86  Wt 356 lb 9.6 oz (161.753 kg)  LMP 10/15/2011  General appearance: healthy, alert, no distress  Mood/affect: Pleasant female. Mood and affect appropriate.  >50% of the time spent with the patient was spent in reviewing the interim history coordinating care and counseling the patient.   The total time spent with the patient was 10 minutes.      ASSESSMENT/PLAN:  Disability paperwork filled out.

## 2011-11-05 ENCOUNTER — Encounter (HOSPITAL_BASED_OUTPATIENT_CLINIC_OR_DEPARTMENT_OTHER): Payer: Medicare Other | Admitting: Naturopath

## 2011-11-10 ENCOUNTER — Encounter (HOSPITAL_BASED_OUTPATIENT_CLINIC_OR_DEPARTMENT_OTHER): Payer: Medicare Other | Admitting: Naturopath

## 2011-11-11 ENCOUNTER — Ambulatory Visit: Payer: Medicare Other | Attending: Naturopath | Admitting: Naturopath

## 2011-11-11 ENCOUNTER — Encounter (HOSPITAL_BASED_OUTPATIENT_CLINIC_OR_DEPARTMENT_OTHER): Payer: Self-pay | Admitting: Naturopath

## 2011-11-11 VITALS — BP 138/78 | HR 80 | Wt 357.2 lb

## 2011-11-11 DIAGNOSIS — F5102 Adjustment insomnia: Secondary | ICD-10-CM

## 2011-11-11 MED ORDER — TRAZODONE HCL 50 MG OR TABS
ORAL_TABLET | ORAL | Status: DC
Start: 2011-11-11 — End: 2012-11-20

## 2011-11-11 NOTE — Progress Notes (Signed)
Sleep difficulty, hot flashing at night affecting sleep but tolerable, menses occcur every 30-45 days    SUBJECTIVE:   Holly Blair is a 46 year old female patient who presents with the following concern:     Chief Complaint: difficulty sleeping and hot flashes    She complains of hot flashes that usually occur at night but sometimes throughout the days as well.  They started within the last couple months.  Her menses have also become slightly irregular, occuring every 30-50 days.  The hot flashes are tolerable but have began to keep her up at night.  Her lack of sleep has started to affect her daily life.  She is currently looking for a job and has had many interviews, but feels exhausted which she thinks has affected her performance in the interviews.  She would like to try something to help with her sleep.    Review of Systems (pertinent):    No recent fever, weight change or illness.  Her moods have been positive-no depression or anxiety.  No genitourinary symptoms.      Patient Active Problem List   Diagnosis   . Depression, major, recurrent   . Acne   . Obesity   . Chronic knee pain   . Chronic back pain   . Senile nuclear cataract   . Myopia with astigmatism and presbyopia       Current Outpatient Prescriptions   Medication Sig   . Acetaminophen 500 MG Oral Tab Take 1 tablet by mouth every 6 hours if needed to relieve pain   . Fluoxetine HCl 20 MG Oral Cap Take 1 capsule by mouth daily   . Ibuprofen 600 MG Oral Tab Take 1 tablet by mouth every 6-8 hours if needed for pain/inflammation. Take with food.   . TraZODone HCl 50 MG Oral Tab Take 1 tablet by mouth at night at bedtime as needed       Review of patient's allergies indicates:  No Known Allergies    OBJECTIVE:  General: Alert, no acute distress   BP 138/78  Pulse 80  Wt 357 lb 3.2 oz (162.025 kg)  LMP 10/17/2011  General appearance: healthy, alert, no distress  Mood/affect: Pleasant female. Mood and affect appropriate.    ASSESSMENT/PLAN:  Transient insomnia  likely secondary to perimenopause but could also be related to Fluoxetine.  She is very happy on Fluoxetine and does not want to do a trial discontinuation  Prescription given for Trazodone 50 mg to be used as needed for sleep  Follow-up as needed

## 2011-11-11 NOTE — Patient Instructions (Signed)
Take one before bed.  If you continue to wake up at night try two tablets before bed.    I only want you to use this medication as needed and try not to use it every night.

## 2012-01-26 ENCOUNTER — Encounter (HOSPITAL_BASED_OUTPATIENT_CLINIC_OR_DEPARTMENT_OTHER): Payer: Medicare Other | Admitting: Naturopath

## 2012-03-21 ENCOUNTER — Telehealth (HOSPITAL_BASED_OUTPATIENT_CLINIC_OR_DEPARTMENT_OTHER): Payer: Self-pay

## 2012-03-21 NOTE — Telephone Encounter (Signed)
Message left  to return my call.

## 2012-03-21 NOTE — Telephone Encounter (Signed)
Holly Blair reports that she has had a headache x 24 hours. She is taking ibuprofen 600 mg po q 6 prn for headache. Her last ibuprofen 600 mg was at 0530 am. Advised her that she could take another 600 mg now.  She denies visual disturbances, but reports that she has a new RX for eye glasses, but has not had them made yet as she needs to find frames. Holly Blair to get her new eye glasses ASAP as this could be causing her headaches.  We do not have any appointments today.  Appointment tomorrow for evaluation.

## 2012-03-21 NOTE — Telephone Encounter (Signed)
Message copied by Elliot Cousin on Tue Mar 21, 2012  1:23 PM  ------       Message from: Pershing Proud       Created: Tue Mar 21, 2012 12:10 PM       Regarding: c/keehn       Contact: [phone]         CONFIRMED PHONE NUMBER: [phone]       CALLERS RELATIONSHIP:Self       RETURN CALL: General message OK              Pt away from work due to migraine and dizziness/no wacc apts available/please call

## 2012-03-22 ENCOUNTER — Telehealth (HOSPITAL_BASED_OUTPATIENT_CLINIC_OR_DEPARTMENT_OTHER): Payer: Self-pay

## 2012-03-22 ENCOUNTER — Ambulatory Visit (INDEPENDENT_AMBULATORY_CARE_PROVIDER_SITE_OTHER): Payer: Medicare Other | Admitting: Obstetrics & Gynecology

## 2012-03-22 ENCOUNTER — Encounter (INDEPENDENT_AMBULATORY_CARE_PROVIDER_SITE_OTHER): Payer: Self-pay | Admitting: Obstetrics & Gynecology

## 2012-03-22 ENCOUNTER — Encounter (HOSPITAL_BASED_OUTPATIENT_CLINIC_OR_DEPARTMENT_OTHER): Payer: Medicare Other | Admitting: Naturopath

## 2012-03-22 VITALS — BP 168/82 | HR 64 | Temp 97.8°F | Resp 12 | Ht 64.0 in | Wt 236.0 lb

## 2012-03-22 DIAGNOSIS — F819 Developmental disorder of scholastic skills, unspecified: Secondary | ICD-10-CM | POA: Insufficient documentation

## 2012-03-22 DIAGNOSIS — R519 Headache, unspecified: Secondary | ICD-10-CM

## 2012-03-22 DIAGNOSIS — N760 Acute vaginitis: Secondary | ICD-10-CM

## 2012-03-22 MED ORDER — METRONIDAZOLE 500 MG OR TABS
ORAL_TABLET | ORAL | Status: AC
Start: 2012-03-22 — End: 2012-04-04

## 2012-03-22 NOTE — Telephone Encounter (Signed)
Message copied by Elliot Cousin on Wed Mar 22, 2012  2:49 PM  ------       Message from: Karren Burly       Created: Wed Mar 22, 2012  2:05 PM       Regarding: c/keehn          CONFIRMED PHONE NUMBER:        CALLERS RELATIONSHIP:       RETURN CALL:              SUBJECT:  Holly Blair scheduled appt  for headache issues appt is today at 4:40 .        Pt called today to add in UTI sx's in appt.        Burning , pain with urination and itching.        Pss added note line for today appt at 4:40 pm.        Wondering if nurse needs phone eval prior to appt arrival .

## 2012-03-22 NOTE — Addendum Note (Signed)
Addended by: Axel Filler on: 03/22/2012 08:05 PM     Modules accepted: Orders

## 2012-03-22 NOTE — Patient Instructions (Addendum)
Thank you for choosing [facility] at [facility] for your visit today. Please follow instructions as discussed with your medical provider and as outlined below.    Take antibiotics as instructed    Please follow-up with your primary care provider in 7 days if needed

## 2012-03-22 NOTE — Telephone Encounter (Signed)
Message left  to return my call.

## 2012-03-22 NOTE — Progress Notes (Signed)
Holly Blair is a 46 year old  female  who presents today with concerns about vaginal symptoms: burning, itching-external and dysuria-external  Onset: last evening, gradually worsening since.  Severity:Moderate  Patient denies fever, chills, abdominal pain, dysuria, hematuria and urinary urgency  Contraception: tubal ligation  Date of last Pap smear: one year ago  Past history vaginitis: NO  Risk factors: none  Self-care/meds: none  Other concerns: headache off & on for 2 days, front or back of head; denies N/V, photophobia, sonophobia.    History sections of chart reviewed and updated today: YES    OBJECTIVE:  BP 168/82  Pulse 64  Temp 97.8 F (36.6 C) (Oral)  Resp 12  Ht 5\' 4"  (1.626 m)  Wt 236 lb (107.049 kg)  BMI 40.51 kg/m2  SpO2 98%  Appearance: alert, no distress, relaxed, cooperative, smiling (pt developmentally delayed)  CVAT: negative  Abdomen: soft, non-tender, large panniculus  Pelvic exam: External genitalia normal, Vagina is rugated and well-estrogenized, with thin, grayish white discharge with faint odor, cervix normal in appearance  Wet mount: clue cells: many (3+), bacteria: packed (4+), WBC: many (3+)           ASSESSMENT/PLAN:  ---bacterial vaginosis  ---Headache, muscular/stress  Take Excedrin OTC for headaches prn  Recheck if symptoms persist, worsen, or new symptoms develop.  The patient indicates understanding of these issues and agrees with the plan.

## 2012-03-23 LAB — PR WET PREP/KOH/TEST, ONSITE
Odor: NEGATIVE
Other: NEGATIVE
Trich: NEGATIVE
Yeast, URN: NEGATIVE

## 2012-04-25 ENCOUNTER — Ambulatory Visit: Payer: Medicare Other | Attending: Naturopath | Admitting: Naturopath

## 2012-04-25 ENCOUNTER — Encounter (HOSPITAL_BASED_OUTPATIENT_CLINIC_OR_DEPARTMENT_OTHER): Payer: Self-pay | Admitting: Naturopath

## 2012-04-25 VITALS — BP 146/81 | HR 74 | Wt 339.8 lb

## 2012-04-25 DIAGNOSIS — F411 Generalized anxiety disorder: Secondary | ICD-10-CM

## 2012-04-25 DIAGNOSIS — F419 Anxiety disorder, unspecified: Secondary | ICD-10-CM

## 2012-04-25 MED ORDER — FLUOXETINE HCL 40 MG OR CAPS
ORAL_CAPSULE | ORAL | Status: DC
Start: 2012-04-25 — End: 2012-08-14

## 2012-04-25 NOTE — Progress Notes (Signed)
SUBJECTIVE:    Holly Blair is an 46 year old female who presents to discuss anxiety.     She is currently managing her anxiety with: Fluoxetine 20 mg  She has been on medication for  Over a year and rates the efficacy of her medication as good  Side effects of medication: none    She reports an increase in her anxiety.  She thinks it is related to recent unemployment.  She is currently looking for work and applying for jobs.  She needs me to fill out paperwork today for unemployment/disability.      She currently reports the following symptoms:  Somatic: disturbed sleep pattern, loss of energy/fatigue  Affective: irritability  Psychologic: anxiety    She denies: feelings of worthlessness, inappropriate guilt, preoccupation with death, suicidal ideation     Patient Active Problem List   Diagnosis   . Depression, major, recurrent   . Acne   . Obesity   . Chronic knee pain   . Chronic back pain   . Senile nuclear cataract   . Myopia with astigmatism and presbyopia   . Mental developmental delay        Current Outpatient Prescriptions   Medication Sig   . Acetaminophen 500 MG Oral Tab Take 1 tablet by mouth every 6 hours if needed to relieve pain   . BAYER ASPIRIN OR PRN migraines   . FISH OIL  None Entered   . FLUoxetine HCl 40 MG Oral Cap Take 1 capsule by mouth daily   . Ibuprofen 600 MG Oral Tab Take 1 tablet by mouth every 6-8 hours if needed for pain/inflammation. Take with food.   . TraZODone HCl 50 MG Oral Tab Take 1 tablet by mouth at night at bedtime as needed     No current facility-administered medications for this visit.        OBJECTIVE:  BP 146/81  Pulse 74  Wt 339 lb 12.8 oz (154.132 kg)  BMI 58.3 kg/m2  LMP 04/24/2012   PhQ9 score: 12/27  MENTAL STATUS EXAMINATION:   MOOD: within normal range   AFFECT: normal intensity   ORIENTATION: intact to person, place and time     ASSESSMENT/ PLAN:   Anxiety  Increase Fluoxetine to 40 mg  Disability and unemployment paperwork filled out   Recheck on  Fluoxetine in one month

## 2012-06-15 ENCOUNTER — Ambulatory Visit (HOSPITAL_BASED_OUTPATIENT_CLINIC_OR_DEPARTMENT_OTHER): Payer: Medicare Other | Admitting: Naturopath

## 2012-06-22 ENCOUNTER — Ambulatory Visit (HOSPITAL_BASED_OUTPATIENT_CLINIC_OR_DEPARTMENT_OTHER): Payer: Medicare Other | Admitting: Naturopath

## 2012-06-27 ENCOUNTER — Ambulatory Visit (HOSPITAL_BASED_OUTPATIENT_CLINIC_OR_DEPARTMENT_OTHER): Payer: Medicare Other | Admitting: Naturopath

## 2012-06-28 ENCOUNTER — Ambulatory Visit (HOSPITAL_BASED_OUTPATIENT_CLINIC_OR_DEPARTMENT_OTHER): Payer: Medicare Other | Admitting: Naturopath

## 2012-08-01 ENCOUNTER — Ambulatory Visit (HOSPITAL_BASED_OUTPATIENT_CLINIC_OR_DEPARTMENT_OTHER): Payer: Medicare Other

## 2012-08-01 ENCOUNTER — Ambulatory Visit (HOSPITAL_BASED_OUTPATIENT_CLINIC_OR_DEPARTMENT_OTHER): Payer: Medicare Other | Admitting: Naturopath

## 2012-08-08 ENCOUNTER — Ambulatory Visit (HOSPITAL_BASED_OUTPATIENT_CLINIC_OR_DEPARTMENT_OTHER): Payer: Medicare Other | Admitting: Naturopath

## 2012-08-08 ENCOUNTER — Ambulatory Visit (HOSPITAL_BASED_OUTPATIENT_CLINIC_OR_DEPARTMENT_OTHER): Payer: Medicare Other

## 2012-08-14 ENCOUNTER — Ambulatory Visit (HOSPITAL_BASED_OUTPATIENT_CLINIC_OR_DEPARTMENT_OTHER): Payer: Medicare Other

## 2012-08-14 ENCOUNTER — Other Ambulatory Visit (HOSPITAL_BASED_OUTPATIENT_CLINIC_OR_DEPARTMENT_OTHER): Payer: Self-pay | Admitting: Naturopath

## 2012-08-14 ENCOUNTER — Encounter (HOSPITAL_BASED_OUTPATIENT_CLINIC_OR_DEPARTMENT_OTHER): Payer: Self-pay | Admitting: Naturopath

## 2012-08-14 ENCOUNTER — Ambulatory Visit: Payer: Medicare Other | Attending: Naturopath | Admitting: Naturopath

## 2012-08-14 VITALS — BP 134/71 | HR 61 | Ht 64.5 in | Wt 339.4 lb

## 2012-08-14 DIAGNOSIS — F419 Anxiety disorder, unspecified: Secondary | ICD-10-CM

## 2012-08-14 DIAGNOSIS — E785 Hyperlipidemia, unspecified: Secondary | ICD-10-CM

## 2012-08-14 DIAGNOSIS — Z1231 Encounter for screening mammogram for malignant neoplasm of breast: Secondary | ICD-10-CM

## 2012-08-14 DIAGNOSIS — R7309 Other abnormal glucose: Secondary | ICD-10-CM | POA: Insufficient documentation

## 2012-08-14 DIAGNOSIS — Z0000 Encounter for general adult medical examination without abnormal findings: Secondary | ICD-10-CM | POA: Insufficient documentation

## 2012-08-14 DIAGNOSIS — N760 Acute vaginitis: Secondary | ICD-10-CM | POA: Insufficient documentation

## 2012-08-14 DIAGNOSIS — B9689 Other specified bacterial agents as the cause of diseases classified elsewhere: Secondary | ICD-10-CM | POA: Insufficient documentation

## 2012-08-14 DIAGNOSIS — R7302 Impaired glucose tolerance (oral): Secondary | ICD-10-CM

## 2012-08-14 DIAGNOSIS — Z139 Encounter for screening, unspecified: Secondary | ICD-10-CM

## 2012-08-14 DIAGNOSIS — Z1329 Encounter for screening for other suspected endocrine disorder: Secondary | ICD-10-CM

## 2012-08-14 DIAGNOSIS — E669 Obesity, unspecified: Secondary | ICD-10-CM | POA: Insufficient documentation

## 2012-08-14 DIAGNOSIS — F411 Generalized anxiety disorder: Secondary | ICD-10-CM | POA: Insufficient documentation

## 2012-08-14 MED ORDER — FLUOXETINE HCL 40 MG OR CAPS
ORAL_CAPSULE | ORAL | Status: DC
Start: 2012-08-14 — End: 2013-10-15

## 2012-08-14 MED ORDER — METRONIDAZOLE 500 MG OR TABS
ORAL_TABLET | ORAL | Status: DC
Start: 2012-08-14 — End: 2014-10-17

## 2012-08-14 NOTE — Patient Instructions (Signed)
Schedule an appointment with Nutrition    Keep record of you period (when they come and how long they last)    Come back in for fasting blood work (1st floor)    We live in [place] and don't see the sunshine nearly enough. I recommend Vitamin D supplementation for all my patients (especially October-June) for prevention of heart disease, osteoporosis, auto immune disease, diabetes, and for mood stability.   I generally recommend 2000 IU Vitamin D3 daily.     Leading a Healthy Life- Six tips to help improve your health and wellness   Eat well to give your body the energy it needs.   Stay or get active.   A healthy mind is part of a healthy body.   Practice safe living habits.   Keep your mind and body clean.   Get regular health care.     Tip #1: Eat well to give your body the energy it needs   Your body needs nutritious foods to stay strong and healthy. Here are some general eating guidelines:     Eat real food (not processed, packaged, and already made)    Eat adequate vegetables and fruits (6 servings daily). These foods contain invaluable micronutrients, phytochemicals, antioxidants and fiber.     Choose whole grains over simple carbs. (brown rice over white rice)    Eat more fiber (legumes, fresh fruit, ground flax seeds) Minimum daily fiber intake should be 30 grams!!!    Eat healthy fats (olive oil, nuts, avocados, essential fatty acids) and avoid trans fats and high saturated fats.    Eat adequate protein (0.5 g/lb body weight daily) from lean sources including: poultry, eggs, legumes, tofu, dairy products, nuts and seeds.     Eat calcium rich foods (milk, cheese, yogurt, kale legumes, tofu, figs, sardines) 2-3 times daily.  Calcium is best absorbed through food.      Eliminate the intake of refined sugar and refined carbohydrates.     Eat healthy snacks with protein source.    Keep salt intake low. Less than 1500 mg daily.    Drink adequate water--generally 64 ounces daily.    Avoid sugary drinks (juice  and soda)    Minimal caffeine intake: no more than one caffeinated drink daily is recommended.    Tip #2: Stay or get active   Exercise for at least 30 minutes at a time, at least 4 times a week. Regular physical activity can help you:     Live longer and feel better     Be stronger and more flexible    Build strong bones    Prevent depression and anxiety    Manage stress    Strengthen your immune system    Maintain a healthy body weight    Improve brain function    Tip #3: Remember: A healthy mind is part of a healthy body   A good state of mind can help you make healthy choices. Here are a few tips for keeping your mind healthy:     Reduce stress in your life!  Stress is one of the main causes of illness.    Make some time every day for things that are fun and bring you joy.    Move!  Exercise is a great way to reduce stress.    Get enough sleep.  Lack of sleep reduces how well you can concentrate, it increases mood swings, and increases stress hormone production in the body.  It has a ripple  effect and pretty much effects everything!     Ask your health care provider for help if you feel depressed or anxious for more than several days in a row.     Tip #4: Practice safe living habits   Accidents and Injuries   Accidents and injuries are the 5th leading cause of death in the [place]   Women under age 22 are more likely to die in motor vehicle accidents than from any other cause.   Accidents in the home cause thousands of permanent injuries every year. The most common accidents are fires, falls, and drowning. To help yourself and your family stay safe:     Install smoke detectors on each floor of your home and know how to use them.    Stay safe on the road: wear a seatbelt, do not ride with someone who has been drinking or taking drugs, avoid cell phone use while driving, wear a helmet when riding a bicycle or motorcycle  Hand Hygiene   Protect yourself from germs by washing your hands often!     Tip #5: Keep your  mind and body clean     Smoking is the single worst thing you can do for your health.  It doubles your risk for developing both heart disease and cancer.    Minimal alcohol intake: no more than one drink a night is recommended.    Tip #6: Get regular health care   Many people think they need to see their provider only when they are sick. But, health care providers can also help you stay healthy.     Find a health care provider who works with you to manage your health.  This is a team game.    Ask your health care provider if you have questions.  There is no such thing as a stupid question-only a question not asked.    Don't save your concerns for your annual check-up.  Come in as they occur.    Smile more-it makes you feel good!

## 2012-08-14 NOTE — Progress Notes (Signed)
SUBJECTIVE:    Holly Blair is a 47 year old female here today for a preventive health visit.     Other problems or concerns today:   She reports a foul vaginal odor for the past month.  She denies abnormal discharge, vaginal itching, and pain. No new sexual partners.     Health Maintenance   Topic Date Due   . Tetanus Booster  06/27/2012   . Influenza > 6 Months  03/21/2013   . Pap Smear 21-64 Years  06/27/2014       Patient Active Problem List   Diagnosis   . Depression, major, recurrent   . Acne   . Obesity   . Chronic knee pain   . Chronic back pain   . Senile nuclear cataract   . Myopia with astigmatism and presbyopia   . Mental developmental delay       Past Medical History   Diagnosis Date   . Depression, major, recurrent    . Acne    . Obesity    . Chronic knee pain    . Chronic back pain    . Anxiety state, unspecified        Past Surgical History   Procedure Laterality Date   . Lig/trnsxj flp tube abdl/vag appr uni/bi         Current Outpatient Prescriptions   Medication Sig   . Acetaminophen 500 MG Oral Tab Take 1 tablet by mouth every 6 hours if needed to relieve pain   . BAYER ASPIRIN OR PRN migraines   . FISH OIL  None Entered   . FLUoxetine HCl 40 MG Oral Cap Take 1 capsule by mouth daily   . Ibuprofen 600 MG Oral Tab Take 1 tablet by mouth every 6-8 hours if needed for pain/inflammation. Take with food.   . MetroNIDAZOLE 500 MG Oral Tab Take 1 tablet by mouth every 8 hours for infection, until all medication is taken   . TraZODone HCl 50 MG Oral Tab Take 1 tablet by mouth at night at bedtime as needed     No current facility-administered medications for this visit.       Review of patient's allergies indicates:  No Known Allergies      SOCIAL HISTORY  Holly Blair is currently living in [place] with friends.  She is on a wait list for housing.  She is unemployed but looking for work and hopes she will get a job at a nearby [employer].       FAMILY HISTORY  Family History   Problem Relation Age  of Onset   . Hypertension Maternal Grandfather    . Cancer Mother      lung cancer, long history of smkoing       GYN HISTORY  Obstetric History    G0   P0   T0   P0   A0   TAB0   SAB0   E0   M0   L0      LMP: 07/22/2012   Menses: menses irregular: she is not sure of the timing but know they have not been as regular as before  Last pap: 2013  Pap history: no history of abnormal paps  Other gyn history: none    SEXUAL HISTORY  Sexual activity: not at present  History of STD: none known  Sexual concerns: No    NUTRITION  Current dietary habits: She reports eating mainly salads.  She avoids 'junk food' and does not  follow a high carbohydrate diet.  Calcium: dairy, cheese, yogurt    EXERCISE  Current exercise habits: walks daily to and from activities, no other regular exercise.     HABITS  Smoker: No  Alcohol: No  Recreational Drug Use: NO    SAFETY  History of sexual or physical abuse: No  Safe in current living space: YES  Regular seat belt use: YES  Stress: No    ROS:  Irregular cycles and vaginal odor.  All other systems reviewed and are negative other than what's listed in HPI    OBJECTIVE:  BP 134/71  Pulse 61  Ht 5' 4.5" (1.638 m)  Wt 339 lb 6.4 oz (153.951 kg)  BMI 57.38 kg/m2  LMP 07/22/2012  Body mass index is 57.38 kg/(m^2).  General appearance: healthy, alert, no distress  Mood/affect: Pleasant female. Mood and affect appropriate.  Skin: Skin color, texture, turgor normal. No rashes or concerning lesions  HEENT:Ear:  External ears normal. Canals clear. TM's normal. and Throat:  oropharynx w/o lesions, erythema, exudate; MMM and no tonsillar enlargement  Neck: supple. No adenopathy. Thyroid symmetric, normal size, without nodules  Respiratory: Respirations even and unlabored. Clear lung fields without crackles, wheezing or expiratory prolongation  Cardiovascular: S1 and S2 present. RRR without murmurs.  Abdomen: soft, non-tender. BS normal. No masses or organomegaly  Breasts: No obvious deformity or  mass to inspection, nipples everted bilaterally, no skin lesion or nipple discharge, no mass palpated, no axillary lymphadenopathy  Pelvic: External genitalia normal, no CMT, no bladder tenderness.  Uterus and adnexa were unpalpable due to abdominal adipose tissue     Vaginal Ph: 6.5  KOH: positive  Wet Mount: positive for clue cells       ASSESSMENT/PLAN:    Annual Preventative Health Exam.  Health Care Maintenance:  Screening Labs: lipids, CMP, Vitamin D. TSH  Mammogram performed today  Pap collected: NO due 2013  Additional Recommendations: discussed my concern regarding her weight.  I would like her to start working with a nutritionist with a goal of weight loss  Preventive counseling: health maintenance  diet rich in 3 omega fatty acids and low in saturated fats  adequate calcium intake  vitamin D supplementation  proper exercise  Follow-up: prn    Bacterial vaginosis  Metronidazole 500 mg BID X 7 days  Follow-up if symptoms persist

## 2012-08-15 ENCOUNTER — Ambulatory Visit (HOSPITAL_BASED_OUTPATIENT_CLINIC_OR_DEPARTMENT_OTHER): Payer: Medicare Other | Admitting: Naturopath

## 2012-08-17 LAB — SCREENING MAMMOGRAPHY BILATERAL

## 2012-09-08 ENCOUNTER — Encounter (HOSPITAL_BASED_OUTPATIENT_CLINIC_OR_DEPARTMENT_OTHER): Payer: Medicare Other | Admitting: Registered"

## 2012-10-27 ENCOUNTER — Telehealth (HOSPITAL_BASED_OUTPATIENT_CLINIC_OR_DEPARTMENT_OTHER): Payer: Self-pay

## 2012-10-27 DIAGNOSIS — Z131 Encounter for screening for diabetes mellitus: Secondary | ICD-10-CM

## 2012-10-27 DIAGNOSIS — Z139 Encounter for screening, unspecified: Secondary | ICD-10-CM

## 2012-10-27 DIAGNOSIS — F419 Anxiety disorder, unspecified: Secondary | ICD-10-CM

## 2012-10-27 DIAGNOSIS — Z13220 Encounter for screening for lipoid disorders: Secondary | ICD-10-CM

## 2012-10-27 NOTE — Telephone Encounter (Signed)
Message copied by Elliot Cousin on Fri Oct 27, 2012 11:49 AM  ------       Message from: Idelle Leech T       Created: Fri Oct 27, 2012 11:42 AM       Regarding: [facility]  [facility]  Orders       Contact: [phone]         CONFIRMED PHONE NUMBER: [phone]       CALLERS FIRST AND LAST NAME: Ellison Carwin              FACILITY NAME: na TITLE: na       CALLERS RELATIONSHIP:Self       RETURN CALL: Detailed message on voicemail only               SUBJECT: General Message        REASON FOR REQUEST: Patient requesting call back advising how long blood/lab orders are good for, before they expire. Patient said she didn't have time to get lab done at her wellness exam. Please contact patient with date of expiration.       Thank you.              MESSAGE: Please see above.                        ------

## 2012-10-27 NOTE — Telephone Encounter (Signed)
08/14/12 Holly Blair Office Visit A/P  ASSESSMENT/PLAN:   Annual Preventative Health Exam.   Health Care Maintenance:   Screening Labs: lipids, CMP, Vitamin D. TSH   Mammogram performed today   Pap collected: NO due 2013   Additional Recommendations: discussed my concern regarding her weight. I would like her to start working with a nutritionist with a goal of weight loss   Preventive counseling: health maintenance   diet rich in 3 omega fatty acids and low in saturated fats   adequate calcium intake   vitamin D supplementation   proper exercise   Follow-up: prn   Bacterial vaginosis   Metronidazole 500 mg BID X 7 days   Follow-up if symptoms persist       New lab orders pended as the ones ordered on 08/14/12 have expired.  Holly Blair is aware to fast for 10-12 hours before tests.  She will check tests on Wednesday morning.

## 2012-11-02 ENCOUNTER — Telehealth (HOSPITAL_BASED_OUTPATIENT_CLINIC_OR_DEPARTMENT_OTHER): Payer: Self-pay

## 2012-11-02 NOTE — Telephone Encounter (Signed)
Message copied by Elliot Cousin on Thu Nov 02, 2012 11:02 AM  ------       Message from: Celedonio Savage       Created: Thu Nov 02, 2012 10:09 AM       Regarding: c/keehn       Contact: [phone]                       ----- Message -----          From: Orson Eva          Sent: 11/02/2012   8:53 AM            To: [name] Rn Pool       Subject: [facility] Returning Call                                  CONFIRMED PHONE NUMBER: [phone]        CALLERS FIRST AND LAST NAME: Ellison Carwin       FACILITY NAME: na TITLE: na       CALLERS RELATIONSHIP:Self       RETURN CALL: Detailed message on voicemail only               SUBJECT: General Message        REASON FOR REQUEST: returning call               MESSAGE: Patient stated a nurse called her regarding her blood work and she has additional questions. Please return call.               Thank you                         ------

## 2012-11-02 NOTE — Telephone Encounter (Signed)
Holly Blair is aware that tests have been ordered and she needs to fast for 10-12 hors before the tests. She will check tests next week.

## 2012-11-20 ENCOUNTER — Other Ambulatory Visit: Payer: Self-pay | Admitting: Pharmacist

## 2012-11-20 DIAGNOSIS — G4700 Insomnia, unspecified: Secondary | ICD-10-CM

## 2012-11-20 MED ORDER — TRAZODONE HCL 50 MG OR TABS
ORAL_TABLET | ORAL | Status: DC
Start: 2012-11-20 — End: 2015-04-02

## 2012-11-20 NOTE — Telephone Encounter (Signed)
Annual 08/14/12

## 2012-12-01 ENCOUNTER — Telehealth (HOSPITAL_BASED_OUTPATIENT_CLINIC_OR_DEPARTMENT_OTHER): Payer: Self-pay | Admitting: Naturopath

## 2012-12-01 NOTE — Telephone Encounter (Signed)
CONFIRMED PHONE NUMBER: [phone]  CALLERS FIRST AND LAST NAME: Gillian Shields  FACILITY NAME: na TITLE: na  CALLERS RELATIONSHIP:OTHER: Friend  RETURN CALL: Detailed message on voicemail only     SUBJECT: Letter/Form Request   REASON FOR REQUEST:Called to advise [name] from [employer] will be faxing paperwork for the patient to receive [facility] services. Paperwork must be faxed back within 10 days.     WHAT IS THE LETTER/FORM FOR: Other: [employer]  WHO SHOULD FILL IT OUT: Holly Blair    WHERE SHOULD IT BE SENT: Per paperwork instructions  DATE NEEDED BY: within 10 days from date received, should be received by clinic today

## 2012-12-01 NOTE — Telephone Encounter (Signed)
I haven't seen any form.

## 2012-12-01 NOTE — Telephone Encounter (Signed)
Form has been faxed. To [employer] [phone].

## 2012-12-01 NOTE — Telephone Encounter (Signed)
Await faxed [facility] form.

## 2012-12-21 ENCOUNTER — Telehealth (HOSPITAL_BASED_OUTPATIENT_CLINIC_OR_DEPARTMENT_OTHER): Payer: Self-pay | Admitting: Naturopath

## 2012-12-21 NOTE — Telephone Encounter (Signed)
CONFIRMED PHONE NUMBER: [phone]  CALLERS FIRST AND LAST NAME: Jerrilyn Cairo Scarpati  FACILITY NAME: N/A TITLE: N/A  CALLERS RELATIONSHIP: Self  RETURN CALL: General message OK     SUBJECT: General Message   REASON FOR REQUEST: Medical Records    MESSAGE: Pt stated she needs to obtain certain, undisclosed "information" from her medical record and is requesting a call back from [name] or her assistant to discuss further.

## 2012-12-25 ENCOUNTER — Ambulatory Visit: Payer: Medicare Other | Attending: Internal Medicine | Admitting: Internal Medicine

## 2012-12-25 ENCOUNTER — Encounter (HOSPITAL_BASED_OUTPATIENT_CLINIC_OR_DEPARTMENT_OTHER): Payer: Self-pay | Admitting: Internal Medicine

## 2012-12-25 VITALS — BP 145/74 | HR 65 | Temp 98.1°F

## 2012-12-25 DIAGNOSIS — B372 Candidiasis of skin and nail: Secondary | ICD-10-CM | POA: Insufficient documentation

## 2012-12-25 DIAGNOSIS — Z6841 Body Mass Index (BMI) 40.0 and over, adult: Secondary | ICD-10-CM | POA: Insufficient documentation

## 2012-12-25 MED ORDER — NYSTATIN 100000 UNIT/GM EX CREA
TOPICAL_CREAM | CUTANEOUS | Status: DC
Start: 2012-12-25 — End: 2014-06-11

## 2012-12-25 MED ORDER — NYSTATIN 100000 UNIT/GM EX POWD
CUTANEOUS | Status: DC
Start: 2012-12-25 — End: 2014-06-11

## 2012-12-25 NOTE — Progress Notes (Signed)
Chief Complaint   Patient presents with   . Rash     heat rash under breast     HPI: Holly Blair is a 47 year old female patient of Elan Moffett, [place], [place] who presents with heat rash underneath both breasts since this weekend. Usually occurs each summer with warmer weather, uses "Butt Paste." However it is more itchy and has an odor. She washes and tries to dry area as much as possible. Would like a copy of this note sent to her employment supervisor Welford Roche @ [employer] [phone] (release of information form signed).    Patient Active Problem List   Diagnosis   . Depression, major, recurrent   . Acne   . Severe obesity   . Chronic knee pain   . Chronic back pain   . Senile nuclear cataract   . Myopia with astigmatism and presbyopia   . Mental developmental delay   . Body mass index 50.0-59.9, adult     ALLERGIES: Review of patient's allergies indicates no known allergies.    Outpatient Prescriptions Marked as Taking for the 12/25/12 encounter (Office Visit) with Lillard Anes   Medication Sig Dispense Refill   . Acetaminophen 500 MG Oral Tab Take 1 tablet by mouth every 6 hours if needed to relieve pain  60 Tab  0   . BAYER ASPIRIN OR PRN migraines       . FISH OIL  None Entered       . FLUoxetine HCl 40 MG Oral Cap Take 1 capsule by mouth daily  90 Cap  4   . Ibuprofen 600 MG Oral Tab Take 1 tablet by mouth every 6-8 hours if needed for pain/inflammation. Take with food.  90 Tab  2   . TraZODone HCl 50 MG Oral Tab Take 1 tablet by mouth at night at bedtime as needed  90 Tab  2     No Facility-Administered Medications for the 12/25/12 encounter (Office Visit) with Lillard Anes.     ROS: per HPI above    PHYSICAL EXAMINATION:  BP 145/74  Pulse 65  Temp(Src) 98.1 F (36.7 C) (Temporal)  LMP 12/11/2012 There is no weight on file to calculate BMI.  CONSTITUTIONAL/GENERAL: very pleasant severely obese Caucasian female. Appearance: Well developed, appearing stated age and in no acute  distress  SKIN: erythematous scattered papules underneath bilateral breasts with some mild skin fissures.    ASSESSMENT/PLAN:  1. Candidiasis of skin and nails: underneath bilateral breasts due to hot weather and exacerbated by body habitus  - Nystatin 100000 UNIT/GM External Cream; Apply to rash underneat breasts 2 times a day  Dispense: 30 g; Refill: 5 -- start with cream for next few weeks  - Nystatin 100000 UNIT/GM External Powder; Apply underneath breasts 2-3 times a day for fungal rash  Dispense: 60 g; Refill: 2 -- to start after acute rash resolves, powder will work better for prevention  [employer] with unscented mild soap and pat dry, allow to air dry as much as possible then apply antifungal treatment    RTC/Call if symptoms worsen or change in character or no improvement.. Will request this note to be faxed to [facility] [phone].

## 2012-12-25 NOTE — Patient Instructions (Signed)
Rash: START nystatin cream twice daily underneath breast after you wash and pat dry the areas    Then when it is looking better then you can switch over to the nystatin powder which you can use 2 to 3 times a day    OK to use Dove unscented soap    I will send a copy of this note to Welford Roche

## 2012-12-26 ENCOUNTER — Telehealth (HOSPITAL_BASED_OUTPATIENT_CLINIC_OR_DEPARTMENT_OTHER): Payer: Self-pay

## 2012-12-26 NOTE — Telephone Encounter (Signed)
Message copied by [name] on Tue Dec 26, 2012 12:00 PM  ------       Message from: Karren Burly       Created: Tue Dec 26, 2012 11:15 AM       Regarding: c/keehn        Contact: [phone]         CONFIRMED PHONE NUMBER:[phone] ok to leave msg        FACILITY NAME:                SUBJECT:  Pt requested some records ( released to her employer )during her visit w/Dr. Renaldo Reel yesterday.        Her employer didn't receive any thing yet.        And        ill request this note to be faxed to [facility] [phone].  ------

## 2012-12-26 NOTE — Telephone Encounter (Signed)
Per pt request the notes from her apt of yesterday with Dr Renaldo Reel have been faxed to [facility].  Voicemail left for pt letting her know this has been done.

## 2013-01-26 ENCOUNTER — Telehealth (HOSPITAL_BASED_OUTPATIENT_CLINIC_OR_DEPARTMENT_OTHER): Payer: Self-pay

## 2013-01-26 NOTE — Telephone Encounter (Signed)
Message copied by Elliot Cousin on Fri Jan 26, 2013  3:07 PM  ------       Message from: Jacinta Shoe A       Created: Fri Jan 26, 2013  2:55 PM       Regarding: c/keehn       Contact: [phone]         CONFIRMED PHONE NUMBER: [phone]       CALLERS FIRST AND LAST NAME:         FACILITY NAME:  TITLE        CALLERS RELATIONSHIP        RETURN CALL:                 SUBJECT: patient not sure if she is having hot flashes and moody swing because of menopause.                 ------

## 2013-01-26 NOTE — Telephone Encounter (Signed)
Calen reports hot flashes and mood swings x 2 weeks.  Appointment 01/30/13 for evaluation.

## 2013-01-30 ENCOUNTER — Encounter (HOSPITAL_BASED_OUTPATIENT_CLINIC_OR_DEPARTMENT_OTHER): Payer: Medicare Other | Admitting: Naturopath

## 2013-02-01 ENCOUNTER — Telehealth (HOSPITAL_BASED_OUTPATIENT_CLINIC_OR_DEPARTMENT_OTHER): Payer: Self-pay | Admitting: Ophthalmology

## 2013-02-01 NOTE — Telephone Encounter (Signed)
CONFIRMED PHONE NUMBER: [phone]  CALLERS FIRST AND LAST NAME: Ellison Carwin    FACILITY NAME: na TITLE: na  CALLERS RELATIONSHIP:Self  RETURN CALL: Detailed message on voicemail only     SUBJECT: General Message   REASON FOR REQUEST: Patient was seen by Dr. Lynnea Maizes 07/13/11 and Patient is stating that her vision is getting worse but she doesn't know if it is because of her vision worsening or if its due to age, patient is wanting to know if she would need to come in for an appointment or if she can speak with someone about this to see what they suggest. Offered to schedule at [facility] on [address] and patient declined  MESSAGE: Please follow up and advise.

## 2013-02-01 NOTE — Telephone Encounter (Signed)
Left vm for pt to call and schedule next available return appt with Taravati

## 2013-02-02 ENCOUNTER — Encounter (HOSPITAL_BASED_OUTPATIENT_CLINIC_OR_DEPARTMENT_OTHER): Payer: Medicare Other | Admitting: Naturopath

## 2013-02-07 ENCOUNTER — Encounter (HOSPITAL_BASED_OUTPATIENT_CLINIC_OR_DEPARTMENT_OTHER): Payer: Self-pay | Admitting: Naturopath

## 2013-02-07 ENCOUNTER — Ambulatory Visit: Payer: Medicare HMO | Attending: Naturopath | Admitting: Naturopath

## 2013-02-07 VITALS — BP 132/81 | HR 65 | Temp 99.0°F | Wt 346.4 lb

## 2013-02-07 DIAGNOSIS — Z131 Encounter for screening for diabetes mellitus: Secondary | ICD-10-CM | POA: Insufficient documentation

## 2013-02-07 DIAGNOSIS — Z13220 Encounter for screening for lipoid disorders: Secondary | ICD-10-CM | POA: Insufficient documentation

## 2013-02-07 DIAGNOSIS — N951 Menopausal and female climacteric states: Secondary | ICD-10-CM

## 2013-02-07 DIAGNOSIS — Z1329 Encounter for screening for other suspected endocrine disorder: Secondary | ICD-10-CM | POA: Insufficient documentation

## 2013-02-09 NOTE — Progress Notes (Signed)
SUBJECTIVE:   Holly Blair is a 47 year old female patient who presents with the following concern:     Chief Complaint: hot flashes    She reports hot flashes that started in the past month.  They are typically mild and occur a couple times a day/night.  They seem to get worse if she is nervous, feels stress, or is in the warm weather.  Her menses have also become slightly irregular.  They have been occuring every 21-50 days for the past 3 months.  They usually last 3-4 days in duration. She wonders if she might be menopausal. The hot flashes are not bothersome enough that she is seeking treatment but she would like me to know that they have started.     She also needs blood work re-ordered because she was not able to make it to the lab last time it was ordered.     Review of Systems (pertinent):    No fever, weight change or recent illness.  No pelvic pain, abnormal discharge or odor.   No abdominal pain or bloating.     Patient Active Problem List   Diagnosis   . Depression, major, recurrent   . Acne   . Severe obesity   . Chronic knee pain   . Chronic back pain   . Senile nuclear cataract   . Myopia with astigmatism and presbyopia   . Mental developmental delay   . Body mass index 50.0-59.9, adult       Current Outpatient Prescriptions   Medication Sig   . Acetaminophen 500 MG Oral Tab Take 1 tablet by mouth every 6 hours if needed to relieve pain   . BAYER ASPIRIN OR PRN migraines   . FISH OIL  None Entered   . FLUoxetine HCl 40 MG Oral Cap Take 1 capsule by mouth daily   . Ibuprofen 600 MG Oral Tab Take 1 tablet by mouth every 6-8 hours if needed for pain/inflammation. Take with food.   . MetroNIDAZOLE 500 MG Oral Tab Take 1 tablet by mouth every 8 hours for infection, until all medication is taken   . Nystatin 100000 UNIT/GM External Cream Apply to rash underneat breasts 2 times a day   . Nystatin 100000 UNIT/GM External Powder Apply underneath breasts 2-3 times a day for fungal rash   . TraZODone HCl 50 MG Oral Tab  Take 1 tablet by mouth at night at bedtime as needed     No current facility-administered medications for this visit.       Review of patient's allergies indicates:  No Known Allergies    OBJECTIVE:  General: Alert, no acute distress   BP 132/81  Pulse 65  Temp(Src) 99 F (37.2 C) (Temporal)  Wt 346 lb 6.4 oz (157.126 kg)  BMI 58.56 kg/m2  LMP 02/04/2013  General appearance: healthy, alert, no distress  Mood/affect: Pleasant female. Mood and affect appropriate.    ASSESSMENT/PLAN:    Hot flashes and irregular cycles, likely perimenopausal  Discussed the sign and symptoms of perimenopause  Gave non-pharmaceutical recommendations for the management of hot flashes  She will return prn for worsening symptoms    Screening labs ordered-lipids, CMP, and TSH

## 2013-04-04 ENCOUNTER — Encounter (HOSPITAL_BASED_OUTPATIENT_CLINIC_OR_DEPARTMENT_OTHER): Payer: Medicare Other | Admitting: Ophthalmology

## 2013-04-04 ENCOUNTER — Telehealth (HOSPITAL_BASED_OUTPATIENT_CLINIC_OR_DEPARTMENT_OTHER): Payer: Self-pay | Admitting: Ophthalmology

## 2013-04-04 NOTE — Telephone Encounter (Signed)
CONFIRMED PHONE NUMBER: [phone]  CALLERS FIRST AND LAST NAME: Holly Blair  FACILITY NAME: na TITLE: na  CALLERS RELATIONSHIP:Self  RETURN CALL: No call back needed     SUBJECT: Cancellation/Reschedule   REASON FOR CANCELLATION: Other   RESCHEDULED: YES, Date 06/06/2013, Time 3 PM

## 2013-04-27 ENCOUNTER — Telehealth (HOSPITAL_BASED_OUTPATIENT_CLINIC_OR_DEPARTMENT_OTHER): Payer: Self-pay | Admitting: Naturopath

## 2013-04-27 NOTE — Telephone Encounter (Signed)
Patient called back; coughing x 2 days, no fever, nonproductive, denies shortness of breath. Chest discomfort from coughing, (noncardiac).  Patient encouraged to continue to drink fluids, walk around to expand chest as much as possible.  If taking OTC cough medicine, take in the morning to avoid interfering with sleep pattern, elevate head of bed for better chest drainage.  Also if fever, blood in sputum, coughing creates shortness of breath, to go to ED; patient agreed.  She will call back early next week if symptoms worse.

## 2013-04-27 NOTE — Telephone Encounter (Signed)
Patient have a cough for about two days,  hoarseness in voice, no fever.

## 2013-04-27 NOTE — Telephone Encounter (Signed)
Message left for patient to call back:  [phone], option #4.

## 2013-04-28 ENCOUNTER — Other Ambulatory Visit (HOSPITAL_BASED_OUTPATIENT_CLINIC_OR_DEPARTMENT_OTHER): Payer: Self-pay | Admitting: Naturopath

## 2013-04-28 DIAGNOSIS — G8929 Other chronic pain: Secondary | ICD-10-CM

## 2013-04-28 NOTE — Telephone Encounter (Signed)
Patient is out of medication. Ibuprofen 600 MG Oral Tab

## 2013-04-29 MED ORDER — IBUPROFEN 600 MG OR TABS
ORAL_TABLET | ORAL | Status: DC
Start: 2013-04-29 — End: 2013-08-15

## 2013-04-29 NOTE — Telephone Encounter (Signed)
Request from Patient: see note     Last apt: 8/14    2/14 RME  Next apt: none  RTC:      Last refill entry:   Ibuprofen 600 MG Oral Tab 90 Tab 2 10/11/2011 by [facility]      Med  reviewed by provider:   Not addressed at recent apts   Appears rarely uses   No current labs at [facility]   [facility] 1/13   Chronic pain      Comment/Action: Rf x 1

## 2013-05-11 ENCOUNTER — Telehealth (HOSPITAL_BASED_OUTPATIENT_CLINIC_OR_DEPARTMENT_OTHER): Payer: Self-pay | Admitting: Naturopath

## 2013-05-11 NOTE — Telephone Encounter (Signed)
Message left to return my call to discuss symptoms. No openings at [facility] today. Hessie Dibble that [facility] has urgent walk in clinic.

## 2013-05-11 NOTE — Telephone Encounter (Signed)
Holly Blair reports that she has had URI symptoms x 1 week. Her symptoms have resolved except that she has a cough with thick mucus. She denies fever and difficulty breathing. She is drinking lots of fluids and resting.  Holly Blair to use a cough expectorant during the day and Robitussin DM at night. She will call if her symptoms do not improve.

## 2013-05-11 NOTE — Telephone Encounter (Signed)
Having chest cold for a week. Having bad cough. Needs some medication called in.

## 2013-06-06 ENCOUNTER — Encounter (HOSPITAL_BASED_OUTPATIENT_CLINIC_OR_DEPARTMENT_OTHER): Payer: Self-pay | Admitting: Ophthalmology

## 2013-06-28 ENCOUNTER — Telehealth (HOSPITAL_BASED_OUTPATIENT_CLINIC_OR_DEPARTMENT_OTHER): Payer: Self-pay | Admitting: Naturopath

## 2013-06-28 NOTE — Telephone Encounter (Signed)
Holly Blair reports severe knee pain. She will go to [facility].

## 2013-06-28 NOTE — Telephone Encounter (Signed)
Pt tore a ligament on her knee awhile ago is taking ibuprofen and its still hurting. She doesn't want to see any other provider till elan returns. Please call

## 2013-07-30 ENCOUNTER — Ambulatory Visit (HOSPITAL_BASED_OUTPATIENT_CLINIC_OR_DEPARTMENT_OTHER): Payer: Self-pay

## 2013-08-02 ENCOUNTER — Telehealth (HOSPITAL_BASED_OUTPATIENT_CLINIC_OR_DEPARTMENT_OTHER): Payer: Self-pay | Admitting: Naturopath

## 2013-08-02 NOTE — Telephone Encounter (Signed)
Holly Blair reports that she has had a low grade fever, nausea, body aches, headache and ear pain x 24 hours. She is taking tylenol for the pain and fever with moderate releif. She is resting and tolerating fluids.  Appointment tomorrow for evaluation.  She is aware to wear a mask and gel her hands.

## 2013-08-02 NOTE — Telephone Encounter (Signed)
Pt sts she has been feeling ill since yesterday 2.11.2015 w/chills, fever and body aches.  Would like a call back from nurse to discuss further.  Scheduled appt in [facility] 2.17.2015

## 2013-08-03 ENCOUNTER — Encounter (HOSPITAL_BASED_OUTPATIENT_CLINIC_OR_DEPARTMENT_OTHER): Payer: Medicare HMO | Admitting: Internal Medicine

## 2013-08-07 ENCOUNTER — Encounter (HOSPITAL_BASED_OUTPATIENT_CLINIC_OR_DEPARTMENT_OTHER): Payer: Self-pay

## 2013-08-14 ENCOUNTER — Telehealth (HOSPITAL_BASED_OUTPATIENT_CLINIC_OR_DEPARTMENT_OTHER): Payer: Self-pay | Admitting: Naturopath

## 2013-08-14 NOTE — Telephone Encounter (Signed)
CONFIRMED PHONE NUMBER: [phone]  CALLERS FIRST AND LAST NAME: Darnelle Maffucci    FACILITY NAME: na TITLE: na  CALLERS RELATIONSHIP:Self  RETURN CALL: Detailed message on voicemail only     SUBJECT: General Message   REASON FOR REQUEST: Arm pit itches and red and hurts    MESSAGE: Patient needing triage regarding right arm pit, itches, red, and hurts please advise

## 2013-08-14 NOTE — Telephone Encounter (Signed)
I'm the attending in [facility] tomorrow & will anticipate seeing her in clinic then.

## 2013-08-14 NOTE — Telephone Encounter (Signed)
Patient states she had shaved her arm pits last week and developed an itchy patch under the Right side.  Applied a nystantin cream to area, but now she's out and area very uncomfortable.  Now has an appt. in [facility] tomorrow 2:20 and will also make appt. with [name] ARNP for annual visit.

## 2013-08-15 ENCOUNTER — Encounter (HOSPITAL_BASED_OUTPATIENT_CLINIC_OR_DEPARTMENT_OTHER): Payer: Medicare HMO

## 2013-08-15 ENCOUNTER — Other Ambulatory Visit: Payer: Self-pay | Admitting: Naturopath

## 2013-08-15 ENCOUNTER — Ambulatory Visit (HOSPITAL_BASED_OUTPATIENT_CLINIC_OR_DEPARTMENT_OTHER): Payer: Medicare HMO

## 2013-08-15 DIAGNOSIS — G8929 Other chronic pain: Secondary | ICD-10-CM

## 2013-08-15 NOTE — Telephone Encounter (Signed)
The patient last received this medication at the requesting pharmacy on               04/29/13

## 2013-08-16 MED ORDER — IBUPROFEN 600 MG OR TABS
ORAL_TABLET | ORAL | Status: DC
Start: 2013-08-15 — End: 2014-01-07

## 2013-08-16 NOTE — Telephone Encounter (Signed)
nv 09/04/13

## 2013-08-20 ENCOUNTER — Ambulatory Visit (HOSPITAL_BASED_OUTPATIENT_CLINIC_OR_DEPARTMENT_OTHER): Payer: Medicare HMO

## 2013-08-22 ENCOUNTER — Encounter (HOSPITAL_BASED_OUTPATIENT_CLINIC_OR_DEPARTMENT_OTHER): Payer: Self-pay | Admitting: Ophthalmology

## 2013-08-22 ENCOUNTER — Ambulatory Visit: Payer: Medicare HMO | Attending: Ophthalmology | Admitting: Ophthalmology

## 2013-08-22 DIAGNOSIS — H5210 Myopia, unspecified eye: Secondary | ICD-10-CM | POA: Insufficient documentation

## 2013-08-22 DIAGNOSIS — H5213 Myopia, bilateral: Secondary | ICD-10-CM

## 2013-08-22 DIAGNOSIS — H52203 Unspecified astigmatism, bilateral: Secondary | ICD-10-CM

## 2013-08-22 DIAGNOSIS — H2510 Age-related nuclear cataract, unspecified eye: Secondary | ICD-10-CM | POA: Insufficient documentation

## 2013-08-22 DIAGNOSIS — H2513 Age-related nuclear cataract, bilateral: Secondary | ICD-10-CM

## 2013-08-22 NOTE — Progress Notes (Signed)
CC:  Worsening near vision    HPI:  Since her last visit here 2 years ago, she has noticed a gradual worsening of her near vision, requiring her to remove her glasses to be able to read up close.  No significant change in distance vision.  Is ready to try bifocals.  No eye discomfort.    ROS: all other systems otherwise negative except as mentioned above.     Assessment/Plan:  1.  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  --Observe.    2.  Myopia, astigmatism, early presbyopia  --Prescription for glasses dispensed today (bifocals or PAL)    RTC 2-3 years or sooner PRN    Amedeo Kinsman, MD

## 2013-08-22 NOTE — Patient Instructions (Signed)
Thank you for coming in today.  During today's visit we reviewed only your ophthalmology (eye-related) medications.  Please follow up with your primary care provider for any questions regarding other medications.    If your eyes were dilated during today's visit, the average dilation will last 4 to 6 hours and may impact your overall vision during this period. Please use caution during this period.    If you need to schedule or change a follow up appointment, please call [phone].  Our phone lines are open 7:00am to 8:00pm Monday through Saturdays and 9:00am to 5:30pm on Sundays.

## 2013-09-04 ENCOUNTER — Ambulatory Visit (HOSPITAL_BASED_OUTPATIENT_CLINIC_OR_DEPARTMENT_OTHER): Payer: Medicare HMO

## 2013-09-04 ENCOUNTER — Ambulatory Visit (HOSPITAL_BASED_OUTPATIENT_CLINIC_OR_DEPARTMENT_OTHER): Payer: Medicare HMO | Admitting: Naturopath

## 2013-09-19 ENCOUNTER — Ambulatory Visit (HOSPITAL_BASED_OUTPATIENT_CLINIC_OR_DEPARTMENT_OTHER): Payer: Medicare HMO | Admitting: Naturopath

## 2013-09-19 ENCOUNTER — Ambulatory Visit (HOSPITAL_BASED_OUTPATIENT_CLINIC_OR_DEPARTMENT_OTHER): Payer: Medicare HMO

## 2013-10-10 ENCOUNTER — Ambulatory Visit (HOSPITAL_BASED_OUTPATIENT_CLINIC_OR_DEPARTMENT_OTHER): Payer: Medicare HMO | Admitting: Naturopath

## 2013-10-15 ENCOUNTER — Other Ambulatory Visit: Payer: Self-pay | Admitting: Naturopath

## 2013-10-15 DIAGNOSIS — F419 Anxiety disorder, unspecified: Secondary | ICD-10-CM

## 2013-10-15 MED ORDER — FLUOXETINE HCL 40 MG OR CAPS
40.0000 mg | ORAL_CAPSULE | Freq: Every day | ORAL | Status: DC
Start: 2013-10-15 — End: 2013-11-06

## 2013-10-15 NOTE — Telephone Encounter (Signed)
Request from Pharmacy  Last filled:  08/13/13   Last appointment: 02/07/13   Next appointment: 10/25/13        Last refill entry:  FLUoxetine HCl 40 MG Oral Cap 90 Cap 4 refills on 08/14/2012       Medication reviewed by provider:  Refilled by provider at annual 08/14/12    Comment/Action: Authorized one refill, get updated prescription at upcoming appointment

## 2013-10-22 ENCOUNTER — Ambulatory Visit (HOSPITAL_BASED_OUTPATIENT_CLINIC_OR_DEPARTMENT_OTHER): Payer: Medicare HMO

## 2013-10-23 ENCOUNTER — Ambulatory Visit (HOSPITAL_BASED_OUTPATIENT_CLINIC_OR_DEPARTMENT_OTHER): Payer: Medicare HMO | Admitting: Naturopath

## 2013-10-23 ENCOUNTER — Ambulatory Visit (HOSPITAL_BASED_OUTPATIENT_CLINIC_OR_DEPARTMENT_OTHER): Payer: Medicare HMO

## 2013-10-25 ENCOUNTER — Ambulatory Visit (HOSPITAL_BASED_OUTPATIENT_CLINIC_OR_DEPARTMENT_OTHER): Payer: Medicare HMO | Admitting: Naturopath

## 2013-11-06 ENCOUNTER — Ambulatory Visit: Payer: Medicare HMO | Attending: Naturopath | Admitting: Naturopath

## 2013-11-06 VITALS — BP 131/79 | HR 73 | Temp 97.5°F | Ht 66.0 in | Wt 335.0 lb

## 2013-11-06 DIAGNOSIS — F411 Generalized anxiety disorder: Secondary | ICD-10-CM | POA: Insufficient documentation

## 2013-11-06 DIAGNOSIS — E669 Obesity, unspecified: Secondary | ICD-10-CM | POA: Insufficient documentation

## 2013-11-06 DIAGNOSIS — E785 Hyperlipidemia, unspecified: Secondary | ICD-10-CM | POA: Insufficient documentation

## 2013-11-06 DIAGNOSIS — F419 Anxiety disorder, unspecified: Secondary | ICD-10-CM

## 2013-11-06 DIAGNOSIS — Z131 Encounter for screening for diabetes mellitus: Secondary | ICD-10-CM | POA: Insufficient documentation

## 2013-11-06 MED ORDER — FLUOXETINE HCL 60 MG OR TABS
60.0000 mg | ORAL_TABLET | Freq: Every day | ORAL | Status: DC
Start: 2013-11-06 — End: 2014-02-08

## 2013-11-06 NOTE — Patient Instructions (Signed)
Schedule an appointment to come back in and see me in 4-6 weeks for an annual  Come into the lab fasting tomorrow

## 2013-11-06 NOTE — Progress Notes (Signed)
Anxiety with work=increase fluoxetine  Order labs  Paperwork for dart  Referral to weight loss clinic    SUBJECTIVE:   Holly Blair is a 47 year old female patient who presents with the following concern:     Chief Complaint: paperwork and medication refills    She needs a refill of her Fluoxetine, but she reports that she is having regular break through anxiety on the 40 mg dose.  Anxiety usually happens at work and does not escalate to panic attacks.     She needs paperwork filled out for [employer] services    She is due for lab work including cholesterol and blood sugar testing.     She has lost 10 pounds since August 2014 but continues to struggle with her weight.  She feels she is eating healthy and walking daily for exercise.     Review of Systems (pertinent):    CONSTITUTIONAL: Denies, fatigue, fever, chills and night sweats, RESPIRATORY: Denies, dyspnea at rest, cough and wheeze, CARDIOVASCULAR: Denies, chest pain and palpitations and PSYCHOSOCIAL: Denies, dysthymia, anhedonia, irritability and suicidal ideation    Patient Active Problem List   Diagnosis   . Depression, major, recurrent   . Acne   . Severe obesity   . Chronic knee pain   . Chronic back pain   . Senile nuclear cataract   . Myopia with astigmatism and presbyopia   . Mental developmental delay   . Body mass index 50.0-59.9, adult       Current Outpatient Prescriptions   Medication Sig Dispense Refill   . Acetaminophen 500 MG Oral Tab Take 1 tablet by mouth every 6 hours if needed to relieve pain 60 Tab 0   . Atorvastatin Calcium 10 MG Oral Tab Take 1 tablet (10 mg) by mouth daily. 90 tablet 3   . BAYER ASPIRIN OR PRN migraines     . FISH OIL  None Entered     . FLUoxetine HCl 60 MG Oral Tab Take 1 tablet (60 mg) by mouth daily. 90 tablet 0   . Ibuprofen 600 MG Oral Tab Take 1 tablet by mouth every 6-8 hours if needed for pain/inflammation. Take with food. 90 tablet 0   . MetroNIDAZOLE 500 MG Oral Tab Take 1 tablet by mouth every 8 hours for infection,  until all medication is taken 14 Tab 0   . Nystatin 100000 UNIT/GM External Cream Apply to rash underneat breasts 2 times a day 30 g 5   . Nystatin 100000 UNIT/GM External Powder Apply underneath breasts 2-3 times a day for fungal rash 60 g 2   . TraZODone HCl 50 MG Oral Tab Take 1 tablet by mouth at night at bedtime as needed 90 Tab 2     No current facility-administered medications for this visit.       Review of patient's allergies indicates:  No Known Allergies    OBJECTIVE:  General: Alert, no acute distress   BP 131/79  Pulse 73  Temp(Src) 97.5 F (36.4 C) (Temporal)  Ht  (1.676 m)  Wt 335 lb (151.955 kg)  BMI 54.10 kg/m2  LMP 10/18/2013  General appearance: healthy, alert, no distress  Mood/affect: Pleasant female. Mood and affect appropriate.    ASSESSMENT/PLAN:  Holly Blair was seen today for medication refills and paperwork.    Diagnoses and associated orders for this visit:    Anxiety  Increase Fluoxetine to 60 mg daily  F/U in one month    Obesity  Referral for Weight Loss  Management to discuss surgical and non surgical options  TSH ordered     Hyperlipidemia  Lipid panel ordered    Diabetes mellitus screening  Hgb A1C and CMP ordered    Dart paperwork filled out    Follow-up for annual exam

## 2013-11-07 ENCOUNTER — Ambulatory Visit
Admit: 2013-11-07 | Discharge: 2013-11-07 | Disposition: A | Discharge status: Home / Self Care | Payer: Medicare HMO | Attending: Naturopath | Admitting: Naturopath

## 2013-11-07 DIAGNOSIS — Z131 Encounter for screening for diabetes mellitus: Secondary | ICD-10-CM | POA: Insufficient documentation

## 2013-11-07 DIAGNOSIS — E669 Obesity, unspecified: Secondary | ICD-10-CM | POA: Insufficient documentation

## 2013-11-07 DIAGNOSIS — E785 Hyperlipidemia, unspecified: Secondary | ICD-10-CM | POA: Insufficient documentation

## 2013-11-07 LAB — COMPREHENSIVE METABOLIC PANEL
ALT (GPT): 13 U/L (ref 7–33)
AST (GOT): 15 U/L (ref 9–38)
Albumin: 3.9 g/dL (ref 3.5–5.2)
Alkaline Phosphatase (Total): 68 U/L (ref 34–121)
Anion Gap: 5 (ref 4–12)
Bilirubin (Total): 0.4 mg/dL (ref 0.2–1.3)
Calcium: 9 mg/dL (ref 8.9–10.2)
Carbon Dioxide, Total: 30 mEq/L (ref 22–32)
Chloride: 102 mEq/L (ref 98–108)
Creatinine: 0.66 mg/dL (ref 0.38–1.02)
GFR, Calc, African American: >60 mL/min (ref >59)
GFR, Calc, European American: >60 mL/min (ref >59)
Glucose: 94 mg/dL (ref 62–125)
Potassium: 4.3 mEq/L (ref 3.6–5.2)
Protein (Total): 7.3 g/dL (ref 6.0–8.2)
Sodium: 137 mEq/L (ref 135–145)
Urea Nitrogen: 10 mg/dL (ref 8–21)

## 2013-11-07 LAB — LIPID PANEL
Cholesterol (LDL): 138 mg/dL — ABNORMAL HIGH (ref <130)
Cholesterol/HDL Ratio: 4.2
HDL Cholesterol: 51 mg/dL (ref >40)
Non-HDL Cholesterol: 161 mg/dL — ABNORMAL HIGH (ref 0–159)
Total Cholesterol: 212 mg/dL — ABNORMAL HIGH (ref <200)
Triglyceride: 113 mg/dL (ref <150)

## 2013-11-07 LAB — THYROID STIMULATING HORMONE: Thyroid Stimulating Hormone: 2.44 u[IU]/mL (ref 0.400–5.000)

## 2013-11-07 LAB — HEMOGLOBIN A1C, RAPID: Hemoglobin A1C: 5.3 % (ref 4.0–6.0)

## 2013-11-08 ENCOUNTER — Encounter (HOSPITAL_BASED_OUTPATIENT_CLINIC_OR_DEPARTMENT_OTHER): Payer: Self-pay | Admitting: Naturopath

## 2013-11-08 ENCOUNTER — Other Ambulatory Visit (HOSPITAL_BASED_OUTPATIENT_CLINIC_OR_DEPARTMENT_OTHER): Payer: Self-pay | Admitting: Naturopath

## 2013-11-08 DIAGNOSIS — E7800 Pure hypercholesterolemia, unspecified: Secondary | ICD-10-CM

## 2013-11-08 MED ORDER — ATORVASTATIN CALCIUM 10 MG OR TABS
10.0000 mg | ORAL_TABLET | Freq: Every day | ORAL | Status: DC
Start: 2013-11-08 — End: 2014-09-12

## 2013-11-15 ENCOUNTER — Telehealth (HOSPITAL_BASED_OUTPATIENT_CLINIC_OR_DEPARTMENT_OTHER): Payer: Self-pay | Admitting: Naturopath

## 2013-11-15 NOTE — Telephone Encounter (Signed)
CONFIRMED PHONE NUMBER: [phone]  CALLERS FIRST AND LAST NAME: [name]  FACILITY NAME: n/a TITLE: n/a  CALLERS RELATIONSHIP:Self  RETURN CALL: Detailed message on voicemail only     SUBJECT: Appointment Request   REASON FOR REQUEST: Patient called to schedule because she received a letter to do so.    REQUEST APPOINTMENT WITH: [name]  REFERRING PROVIDER: [facility]  REQUESTED DATE: did not discuss  REQUESTED TIME: did not discuss  UNABLE TO APPOINT: Other: Referral did not have scheduling instructions.  Please call the patient back as soon as possible.    Thank you so much!

## 2013-11-16 NOTE — Telephone Encounter (Signed)
The pt has been scheduled.

## 2013-11-22 ENCOUNTER — Telehealth (HOSPITAL_BASED_OUTPATIENT_CLINIC_OR_DEPARTMENT_OTHER): Payer: Self-pay | Admitting: Naturopath

## 2013-11-22 NOTE — Telephone Encounter (Signed)
Pt called wanting to know if we sent her records to the [employer] for her application bus service. She received a phone call from them that they have not received it.   Pt will call back with their contact info.

## 2013-11-23 NOTE — Telephone Encounter (Signed)
Did this get sent?  I know I filled out the paperwork at her last visit.

## 2013-11-23 NOTE — Telephone Encounter (Addendum)
The patient has the application after  [name]  Signed the [employer] application.  She is requesting to fax the office visit on May 19/2015.  I will fax the notes to Hilda [phone].  This was the office visit  On 11/06/2013.

## 2013-12-18 ENCOUNTER — Encounter (HOSPITAL_BASED_OUTPATIENT_CLINIC_OR_DEPARTMENT_OTHER): Payer: Medicare HMO | Admitting: Naturopath

## 2013-12-18 ENCOUNTER — Ambulatory Visit (HOSPITAL_BASED_OUTPATIENT_CLINIC_OR_DEPARTMENT_OTHER): Payer: Medicare HMO

## 2013-12-27 ENCOUNTER — Encounter (HOSPITAL_BASED_OUTPATIENT_CLINIC_OR_DEPARTMENT_OTHER): Payer: Medicare HMO

## 2014-01-07 ENCOUNTER — Other Ambulatory Visit: Payer: Self-pay | Admitting: Pharmacist

## 2014-01-07 DIAGNOSIS — G8929 Other chronic pain: Secondary | ICD-10-CM

## 2014-01-07 MED ORDER — IBUPROFEN 600 MG OR TABS
ORAL_TABLET | ORAL | Status: DC
Start: 2014-01-07 — End: 2014-06-06

## 2014-02-06 ENCOUNTER — Telehealth (HOSPITAL_BASED_OUTPATIENT_CLINIC_OR_DEPARTMENT_OTHER): Payer: Self-pay | Admitting: Naturopath

## 2014-02-06 NOTE — Telephone Encounter (Signed)
Patient was notified that we have not received the fax. Fax # was not the correct number.  I gave [phone] to fax it again. Awaiting for the form.

## 2014-02-06 NOTE — Telephone Encounter (Signed)
Await fax.

## 2014-02-06 NOTE — Telephone Encounter (Signed)
As per pt, her application for [employer] is going to be faxed to [phone] today.  Pt reports that she has been denied for services because the forms were not complete.

## 2014-02-08 ENCOUNTER — Other Ambulatory Visit: Payer: Self-pay | Admitting: Pharmacist

## 2014-02-08 DIAGNOSIS — F419 Anxiety disorder, unspecified: Secondary | ICD-10-CM

## 2014-02-08 MED ORDER — FLUOXETINE HCL 20 MG OR TABS
60.0000 mg | ORAL_TABLET | Freq: Every day | ORAL | Status: DC
Start: 2014-02-08 — End: 2014-04-11

## 2014-02-08 NOTE — Telephone Encounter (Signed)
Per 11/06/13 Patient Instructions Dr.Keehn: "Schedule an appointment to come back in and see me in 4-6 weeks for an annual"    One refill authorized.  Please schedule follow up visit.    90 tabs last filled 01/10/14

## 2014-02-08 NOTE — Telephone Encounter (Signed)
Left message

## 2014-02-13 NOTE — Telephone Encounter (Signed)
The form was received last 02/08/2014.  This was given to Elan to fill out and sign.  I notified the patient that I will call her when done .  The form has to be fax before Sept.07/2013

## 2014-02-15 NOTE — Telephone Encounter (Signed)
DART form was completed.  This was fax to Debbe Odea at [employer] of [place] [phone].  Patient was notified that it has been faxed.

## 2014-02-19 ENCOUNTER — Encounter (HOSPITAL_BASED_OUTPATIENT_CLINIC_OR_DEPARTMENT_OTHER): Payer: Medicare HMO | Admitting: Naturopath

## 2014-02-26 ENCOUNTER — Encounter (HOSPITAL_BASED_OUTPATIENT_CLINIC_OR_DEPARTMENT_OTHER): Payer: Medicare HMO | Admitting: Naturopath

## 2014-02-28 ENCOUNTER — Encounter (HOSPITAL_BASED_OUTPATIENT_CLINIC_OR_DEPARTMENT_OTHER): Payer: Medicare HMO | Admitting: Naturopath

## 2014-04-04 ENCOUNTER — Encounter (HOSPITAL_BASED_OUTPATIENT_CLINIC_OR_DEPARTMENT_OTHER): Payer: Medicare HMO | Admitting: Naturopath

## 2014-04-09 ENCOUNTER — Encounter (HOSPITAL_BASED_OUTPATIENT_CLINIC_OR_DEPARTMENT_OTHER): Payer: Medicare HMO | Admitting: Naturopath

## 2014-04-11 ENCOUNTER — Other Ambulatory Visit (HOSPITAL_BASED_OUTPATIENT_CLINIC_OR_DEPARTMENT_OTHER): Payer: Self-pay | Admitting: Naturopath

## 2014-04-11 ENCOUNTER — Telehealth (HOSPITAL_BASED_OUTPATIENT_CLINIC_OR_DEPARTMENT_OTHER): Payer: Self-pay | Admitting: Naturopath

## 2014-04-11 ENCOUNTER — Encounter (HOSPITAL_BASED_OUTPATIENT_CLINIC_OR_DEPARTMENT_OTHER): Payer: Medicare HMO | Admitting: Naturopath

## 2014-04-11 DIAGNOSIS — F419 Anxiety disorder, unspecified: Secondary | ICD-10-CM

## 2014-04-11 MED ORDER — FLUOXETINE HCL 20 MG OR TABS
60.0000 mg | ORAL_TABLET | Freq: Every day | ORAL | Status: DC
Start: 2014-04-11 — End: 2014-09-12

## 2014-04-11 NOTE — Telephone Encounter (Signed)
Pt called c/o bilat foot swelling that 'forms a dent when pressed', unable to ambulate to get the bus to come to [facility] for appt today.  Lives in [place].  Denies sob, chest pain, joint pain.  States she's out of her anxiety med, (fluoxetine) which she takes daily.  Pt states her sister Cammy can take her in about 2 hrs to the ED at [facility] in [place] which is close by.  Pt advised if she becomes sob, chest pain, anxious where she feels out of control to call 9-11, she agreed.  Report given to Hassel Neth at [facility] [place] ED.

## 2014-04-11 NOTE — Telephone Encounter (Signed)
-----   Message from [facility] sent at 04/11/2014  9:58 AM PDT -----  Please call patient and let her know I sent in a prescription for Fluoxetine to the [facility] in [place]

## 2014-04-11 NOTE — Telephone Encounter (Signed)
Message left on vm that Rx has been faxed to pharmacy.

## 2014-04-29 ENCOUNTER — Telehealth (HOSPITAL_BASED_OUTPATIENT_CLINIC_OR_DEPARTMENT_OTHER): Payer: Self-pay | Admitting: Naturopath

## 2014-04-29 NOTE — Telephone Encounter (Signed)
Pt is calling to find out the name of her cholesterol pill and the mg.

## 2014-04-30 NOTE — Telephone Encounter (Signed)
Per medication list  Atorvastatin Calcium 10 MG Oral Tab, one tablet po daily.  Message left on voice mail to return my call.

## 2014-05-24 ENCOUNTER — Telehealth (HOSPITAL_BASED_OUTPATIENT_CLINIC_OR_DEPARTMENT_OTHER): Payer: Self-pay | Admitting: Ophthalmology

## 2014-05-24 NOTE — Telephone Encounter (Signed)
Mailed prescription to home. JB

## 2014-05-24 NOTE — Telephone Encounter (Signed)
CONFIRMED PHONE NUMBER: [phone]  CALLERS FIRST AND LAST NAME: Darnelle Maffucci  FACILITY NAME: n/a TITLE: n/a  CALLERS RELATIONSHIP:Self  RETURN CALL: Detailed message on voicemail only     SUBJECT: General Message   REASON FOR REQUEST: Prescription for Bifocals    MESSAGE: Patient states she would like her prescription for bifocals sent by mail to her home address.  Thank you.

## 2014-05-27 ENCOUNTER — Telehealth: Payer: Self-pay | Admitting: Naturopath

## 2014-05-27 NOTE — Telephone Encounter (Signed)
CONFIRMED PHONE MMITVI:[phone]   CALLERS FIRST AND LAST NAME: Holly Blair    CALLERS RELATIONSHIP:Self  RETURN CALL: Detailed message on voicemail only     SUBJECT: General Message   REASON FOR REQUEST: knee pain    MESSAGE: patient would like to speak with Holly Blair (General) regarding her knee pain. Please advise.

## 2014-05-27 NOTE — Telephone Encounter (Signed)
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 They need a letter stating pt's pain level/documentation/etc/please call the coach with any questions/she's sending a questionaire for you to fill out

## 2014-05-29 NOTE — Telephone Encounter (Signed)
I left a message to patient's voicemail to cal me back to let me know what exactly she needs from [place].

## 2014-05-30 ENCOUNTER — Telehealth (HOSPITAL_BASED_OUTPATIENT_CLINIC_OR_DEPARTMENT_OTHER): Payer: Self-pay | Admitting: Naturopath

## 2014-05-30 NOTE — Telephone Encounter (Signed)
Duplicate encounter

## 2014-05-30 NOTE — Telephone Encounter (Signed)
Per Holly Blair, she wants to get re-evaluated for her knee pain . She also wants to appeal to get [employer] for her ride. She will call [employer] to fax [place] an appeal form for Jonathon Jordan to fill out. She will be evaluated on March 22/2015.

## 2014-05-30 NOTE — Telephone Encounter (Signed)
Spoke w/ pt directly who explained that her 'boss person - Moise Boring had faxed forms to [facility] regarding her knee pain'.  [facility] has not rec'd this form; therefore, pt will have Ms. Herbert refax.

## 2014-05-30 NOTE — Telephone Encounter (Signed)
Message left on voice mail to return my call.

## 2014-05-30 NOTE — Telephone Encounter (Signed)
Pt is calling christine back

## 2014-05-30 NOTE — Telephone Encounter (Signed)
Pt is returning Holly Blair's call

## 2014-05-30 NOTE — Telephone Encounter (Signed)
LM w/ pt's consent to Moise Boring that we have rec'd the fax from [facility]. LM for pt that [facility] rec'd fax.

## 2014-05-31 ENCOUNTER — Encounter (HOSPITAL_BASED_OUTPATIENT_CLINIC_OR_DEPARTMENT_OTHER): Payer: Medicare HMO | Admitting: Naturopath

## 2014-05-31 NOTE — Telephone Encounter (Signed)
I ask the patient to call [employer] to fax [place] an appeal "form" or maybe filling out another form. STILL waiting for patient to call us to let us know what to do. Patient has an appt on 12/22/ for re-evaluation of knee pain and filling out forms if necessary.

## 2014-05-31 NOTE — Telephone Encounter (Signed)
I received letter.  Are there new forms for me to fill out or does an appeal letter need to be written.  It is unclear in the paperwork I have received.

## 2014-05-31 NOTE — Telephone Encounter (Signed)
Letter from Moise Boring ( patient job coach) was fax attn to [employer].  The patient wants to appeal to get a ride from [place].  Patient has an appointment on DEc. 22/2015 to get re-evaluated with her knee pain and to fill out a new form for [employer].  Letter was given to [name]  (in her folder).

## 2014-06-06 ENCOUNTER — Other Ambulatory Visit: Payer: Self-pay | Admitting: Naturopath

## 2014-06-06 DIAGNOSIS — R52 Pain, unspecified: Secondary | ICD-10-CM

## 2014-06-06 MED ORDER — IBUPROFEN 600 MG OR TABS
ORAL_TABLET | ORAL | Status: DC
Start: 2014-06-06 — End: 2014-09-04

## 2014-06-06 NOTE — Telephone Encounter (Signed)
The patient last received this medication at the requesting pharmacy on               01/07/14

## 2014-06-11 ENCOUNTER — Encounter (HOSPITAL_BASED_OUTPATIENT_CLINIC_OR_DEPARTMENT_OTHER): Payer: Self-pay | Admitting: Naturopath

## 2014-06-11 ENCOUNTER — Ambulatory Visit: Payer: Medicare HMO | Attending: Naturopath | Admitting: Naturopath

## 2014-06-11 VITALS — BP 134/61 | HR 66 | Ht 66.0 in | Wt 335.8 lb

## 2014-06-11 DIAGNOSIS — M1711 Unilateral primary osteoarthritis, right knee: Secondary | ICD-10-CM

## 2014-06-11 DIAGNOSIS — R0683 Snoring: Secondary | ICD-10-CM | POA: Insufficient documentation

## 2014-06-11 DIAGNOSIS — B372 Candidiasis of skin and nail: Secondary | ICD-10-CM | POA: Insufficient documentation

## 2014-06-11 DIAGNOSIS — Z23 Encounter for immunization: Secondary | ICD-10-CM | POA: Insufficient documentation

## 2014-06-11 DIAGNOSIS — M25561 Pain in right knee: Secondary | ICD-10-CM | POA: Insufficient documentation

## 2014-06-11 MED ORDER — NYSTATIN 100000 UNIT/GM EX CREA
TOPICAL_CREAM | CUTANEOUS | Status: DC
Start: 2014-06-11 — End: 2015-07-14

## 2014-06-11 MED ORDER — NYSTATIN 100000 UNIT/GM EX POWD
CUTANEOUS | Status: DC
Start: 2014-06-11 — End: 2014-12-12

## 2014-06-13 NOTE — Progress Notes (Signed)
SUBJECTIVE:   Holly Blair is a 48 year old female patient who presents with the following concern:     Chief Complaint: Right knee pain    She would like to further discuss her right knee pain and requests an appeal letter to [employer] to qualify for services.   She reports right knee pain and swelling.  The swelling also extends into her right foot.  Pain is always present but worse with walking and standing for greater than 4 hours (while at work).  She has to take two buses and walk several blocks to catch the bus for work.  This not only causes severe pain but also triggers anxiety that she will have to miss work either because of the pain or not making it to the bus stop.  She trats her pain with Ibuprofen but it only relieves it 40-50%.  She is not able to do PT because of transportation issues.      Review of Systems:    No other joint pain or swelling.  Positive for mild anxiety/depression related to pain and transportation issue.  Positive for skin rash under breasts.  Requests refill of fungal treatment.   Positive for fatigue and snoring.  She is interested in referral to sleep clinic.     Patient Active Problem List   Diagnosis   . Depression, major, recurrent   . Acne   . Severe obesity   . Chronic knee pain   . Chronic back pain   . Senile nuclear cataract   . Myopia with astigmatism and presbyopia   . Mental developmental delay   . Body mass index 50.0-59.9, adult       Current Outpatient Prescriptions   Medication Sig Dispense Refill   . Acetaminophen 500 MG Oral Tab Take 1 tablet by mouth every 6 hours if needed to relieve pain 60 Tab 0   . Atorvastatin Calcium 10 MG Oral Tab Take 1 tablet (10 mg) by mouth daily. 90 tablet 3   . BAYER ASPIRIN OR PRN migraines     . FISH OIL  None Entered     . FLUoxetine HCl 20 MG Oral Tab Take 3 tablets (60 mg) by mouth daily. 90 tablet 3   . Ibuprofen 600 MG Oral Tab Take 1 tablet by mouth every 6-8 hours if needed for pain/inflammation. Take with food. 90 tablet 0   .  MetroNIDAZOLE 500 MG Oral Tab Take 1 tablet by mouth every 8 hours for infection, until all medication is taken 14 Tab 0   . Nystatin 100000 UNIT/GM External Cream Apply to rash underneat breasts 2 times a day 30 g 5   . Nystatin 100000 UNIT/GM External Powder Apply underneath breasts 2-3 times a day for fungal rash 60 g 2   . TraZODone HCl 50 MG Oral Tab Take 1 tablet by mouth at night at bedtime as needed 90 Tab 2     No current facility-administered medications for this visit.       Review of patient's allergies indicates:  No Known Allergies    OBJECTIVE:  General: Alert, no acute distress   BP 134/61 mmHg  Pulse 66  Ht 5\' 6"  (1.676 m)  Wt 335 lb 12.8 oz (152.318 kg)  BMI 54.23 kg/m2  General appearance: healthy, alert, no distress  Mood/affect: Pleasant female. Mood and affect appropriate.  Skin: erythremic patches with satellite lesions under left breast    Right knee: No deformity noted.  Swollen when compared to left.  Generalized tenderness to palpation.  ROM without limitations.  Negative McMurray's, Anterior/Posterior drawer      ASSESSMENT/PLAN:  Kirra was seen today for knee pain.    Diagnoses and associated orders for this visit:    Right knee pain  Further evaluation with X ray as one has never been done  I will write a letter to [employer] for appeal and also send one to [facility]    Snoring and Fatigue  - REFERRAL TO SLEEP DISORDER CTR    Candidiasis of skin and nails  - Nystatin 100000 UNIT/GM External Cream; Apply to rash underneat breasts 2 times a day  - Nystatin 100000 UNIT/GM External Powder; Apply underneath breasts 2-3 times a day for fungal rash    Need for diphtheria-tetanus-pertussis (Tdap) vaccine, adult/adolescent  - Tdap vacc (adult) 0.5 mL IM - 16109    Total time approximately 25 minutes spent face-to face with patient of which more than 50% was spent counseling as outlined in this note.

## 2014-06-17 ENCOUNTER — Telehealth (HOSPITAL_BASED_OUTPATIENT_CLINIC_OR_DEPARTMENT_OTHER): Payer: Self-pay | Admitting: Naturopath

## 2014-06-17 NOTE — Telephone Encounter (Signed)
CONFIRMED PHONE NUMBER: [phone]  CALLERS FIRST AND LAST NAME: [name]   FACILITY NAME: [employer]  TITLE: Job coach   CALLERS RELATIONSHIP:OTHER: see above   RETURN CALL: Detailed message on voicemail only     SUBJECT: General Message   REASON FOR REQUEST: [name] phoned to check status of forms faxed after 12.10, to [employer], via [place].   (Please see closed TE from 12.11) Patient had an appointment with Elan on 12.22 and [name] thought she would receive an update after that   Appointment. She can be reached at the above phone number and her fax number is : [phone]     MESSAGE: see above

## 2014-06-18 NOTE — Telephone Encounter (Signed)
Letter completed and given to [facility]

## 2014-06-18 NOTE — Telephone Encounter (Signed)
Laarni- Can you call [name] re forms?

## 2014-06-18 NOTE — Telephone Encounter (Signed)
The letter was fax to  [place] [facility] at [employer] [facility] [employer] [phone].  This was also mailed to the office address and to patient home address.

## 2014-06-18 NOTE — Telephone Encounter (Signed)
I told Holly Blair last week that I would write an appeal letter this week and send a copy to her and fax one to [name].  Letter will be faxed to val after it is written either later today or tomorrow.

## 2014-06-18 NOTE — Telephone Encounter (Signed)
Fax received from [employer]- job coach ( Information systems manager) from [employer].  These are given to the provider.

## 2014-06-18 NOTE — Telephone Encounter (Signed)
I notified [name] that Holly Blair will write a letter of appeal  And will send one to her and to the patient.  She still wants an answer to those questions that she faxed a week ago.   She will fax again the authorization consent and the questionnaires that need to be answered.

## 2014-06-19 NOTE — Telephone Encounter (Signed)
Holly Blair received the letter via fax.  She wants to thank [name] for the letter.

## 2014-07-01 ENCOUNTER — Telehealth (HOSPITAL_BASED_OUTPATIENT_CLINIC_OR_DEPARTMENT_OTHER): Payer: Self-pay | Admitting: Naturopath

## 2014-07-01 NOTE — Telephone Encounter (Signed)
CONFIRMED PHONE NUMBER: [phone]  CALLERS FIRST AND LAST NAME: Darnelle Maffucci   FACILITY NAME: n/a TITLE: n/a  CALLERS RELATIONSHIP:Self  RETURN CALL: OK to leave detailed message with anyone that answers     SUBJECT: General Message   REASON FOR REQUEST: PCP Assignment Request    MESSAGE: Pt would like [name] to call [facility] to request to be pt's pcp. Pt states this is [facility]'s procedure for referrals. Please call [phone] to do so. Thank you.

## 2014-07-02 NOTE — Telephone Encounter (Signed)
Done

## 2014-07-02 NOTE — Telephone Encounter (Signed)
Pt referral for sleep apnea was denied by [employer]. Please call [facility] w/ PCP information. [facility] will accept referral once [facility] calls w/ requested information.

## 2014-07-04 ENCOUNTER — Telehealth (HOSPITAL_BASED_OUTPATIENT_CLINIC_OR_DEPARTMENT_OTHER): Payer: Self-pay | Admitting: Naturopath

## 2014-07-04 NOTE — Telephone Encounter (Signed)
Pt needs a sick leave letter for 06/27/13 and 06/28/13/send to [facility]/fax [phone] attn: rachael

## 2014-07-05 NOTE — Telephone Encounter (Signed)
Message left on vm that letter has been faxed to [facility] [phone].

## 2014-07-05 NOTE — Telephone Encounter (Signed)
Letter signed.  Please send to appropriate address or notify patient for pick-up.

## 2014-09-04 ENCOUNTER — Other Ambulatory Visit: Payer: Self-pay | Admitting: Naturopath

## 2014-09-04 DIAGNOSIS — R52 Pain, unspecified: Secondary | ICD-10-CM

## 2014-09-04 MED ORDER — IBUPROFEN 600 MG OR TABS
600.0000 mg | ORAL_TABLET | Freq: Four times a day (QID) | ORAL | Status: DC | PRN
Start: 2014-09-04 — End: 2015-02-21

## 2014-09-04 NOTE — Telephone Encounter (Signed)
The patient last received this medication at the requesting pharmacy on               06/06/14

## 2014-09-12 ENCOUNTER — Encounter (HOSPITAL_BASED_OUTPATIENT_CLINIC_OR_DEPARTMENT_OTHER): Payer: Self-pay | Admitting: Naturopath

## 2014-09-12 ENCOUNTER — Other Ambulatory Visit (HOSPITAL_BASED_OUTPATIENT_CLINIC_OR_DEPARTMENT_OTHER): Payer: Self-pay | Admitting: Naturopath

## 2014-09-12 DIAGNOSIS — F419 Anxiety disorder, unspecified: Secondary | ICD-10-CM

## 2014-09-12 DIAGNOSIS — E7800 Pure hypercholesterolemia, unspecified: Secondary | ICD-10-CM

## 2014-09-12 MED ORDER — FLUOXETINE HCL 20 MG OR TABS
60.0000 mg | ORAL_TABLET | Freq: Every day | ORAL | Status: DC
Start: 2014-09-12 — End: 2015-04-24

## 2014-09-12 MED ORDER — ATORVASTATIN CALCIUM 10 MG OR TABS
10.0000 mg | ORAL_TABLET | Freq: Every day | ORAL | Status: DC
Start: 2014-09-12 — End: 2015-07-15

## 2014-09-12 NOTE — Telephone Encounter (Signed)
Collen is aware

## 2014-09-12 NOTE — Telephone Encounter (Signed)
Prescriptions have been refilled.

## 2014-09-12 NOTE — Telephone Encounter (Signed)
None call dropped

## 2014-09-12 NOTE — Telephone Encounter (Signed)
CONFIRMED PHONE IZXYOF:[phone]  CALLERS FIRST AND LAST NAME: Holly Blair  FACILITY NAME: n/a TITLE: n/a   CALLERS RELATIONSHIP:Self  RETURN CALL: Detailed message on voicemail only     SUBJECT: Refill Request    REASON FOR REQUEST:Refill Request- patient has 2 days left of the medications    MEDICATION(S): fluoxetine and atorvastatin  MEDICATION (S) NEEDED BY: soon as possible  PRESCRIBING PROVIDER: [name], ARNP  PHARMACY NAME AND LOCATION: [facility] [place] [address]  PHARMACY PHONE AND FAX NUMBER:  [phone] or [phone]     ADDITIONAL INFORMATION: patient advised that [facility] had put in a request for her for the medications, but they hadn't heard back yet.

## 2014-09-17 ENCOUNTER — Telehealth (HOSPITAL_BASED_OUTPATIENT_CLINIC_OR_DEPARTMENT_OTHER): Payer: Self-pay

## 2014-09-17 NOTE — Telephone Encounter (Signed)
CONFIRMED PHONE NUMBER: [phone]    CALLERS FIRST AND LAST NAME: Darnelle Maffucci    FACILITY NAME: n/a TITLE: n/a  CALLERS RELATIONSHIP:Self  RETURN CALL: Detailed message on voicemail only     SUBJECT: Appointment Request   REASON FOR REQUEST: Bariatric seminar     REQUEST APPOINTMENT WITH: na  SYMPTOMS: bariatric   REFERRING PROVIDER: Eldred Manges, ARNP  REQUESTED DATE: 09/26/14  REQUESTED TIME: 5:30  UNABLE TO APPOINT: Other: per sop patient doesn't have scheduling instructions. Please follow up, thank you!

## 2014-09-19 ENCOUNTER — Ambulatory Visit: Payer: Medicare HMO | Attending: Sleep Medicine | Admitting: Internal Medicine

## 2014-09-19 ENCOUNTER — Ambulatory Visit (HOSPITAL_BASED_OUTPATIENT_CLINIC_OR_DEPARTMENT_OTHER): Payer: Medicare HMO

## 2014-09-19 VITALS — BP 121/74 | HR 75 | Temp 97.5°F | Ht 64.17 in | Wt 332.8 lb

## 2014-09-19 DIAGNOSIS — G478 Other sleep disorders: Secondary | ICD-10-CM | POA: Insufficient documentation

## 2014-09-19 DIAGNOSIS — R0689 Other abnormalities of breathing: Secondary | ICD-10-CM

## 2014-09-19 DIAGNOSIS — G4733 Obstructive sleep apnea (adult) (pediatric): Secondary | ICD-10-CM | POA: Insufficient documentation

## 2014-09-19 NOTE — Progress Notes (Signed)
SLEEP CENTER INITIAL CONSULTATION    REFERRING PROVIDER: Eldred Manges  PRIMARY CARE PROVIDER: Eldred Manges, ARNP    ID/CC  We are asked to see this patient at the request of [facility] for evaluation of sleep apnea .    HISTORY OF PRESENT ILLNESS   Information is obtained from the patient, who is an excellent historian.      Sleep History and Workup: None.  Upcoming Surgeries: None.    Typical Sleep Habits  The patient's primary question is related to breathing noises during sleep.  She reports that her friend was making moaning noises sounded sexual when she was sleeping.  The patient's primary care provider was wondering if she has sleep apnea.  Otherwise, patient denies major issues with her sleep.    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  She is currently homeless and stays in the living room of her friend's place as she awaits her housing.  She is currently on the waiting list.  Bedtime is 9 PM, sleep onset within 20 minutes, wake 6 AM naturally in order to get ready for work by 8 AM.  On weekends, bedtime 10-11 PM, similar sleep onset, wake 7-8 AM usually by her friend's children.  She feels variably refreshed upon awakening.  Only awakening at night is generally at 3 AM to use the bathroom.  At times if she is worrying about things like she will have some difficulty with sleep onset.  She uses trazodone very rarely to help with this.  She does not nap typically.    Reports snoring only when she is very tired.  She denies her friend noting any other symptoms.  She denies nasal congestion, restless legs, or other parasomnias.  She does frequently talk in her sleep.    Epworth Sleepiness Scale is 3.  She denies drowsy driving.  She primarily takes public transportation.  She denies daytime fatigue.  She is able to carry out her job functions without any difficulties in terms of attention and energy.  Insomnia severity index 9.    REVIEW OF SYSTEMS  Pertinent positives  include: As noted per HPI.  Occasional headaches.  Stable swelling in her feet that resolve with elevation.  Sexual difficulty.  Joint swelling.  Eczema.  Hot flashes.  Anxiety/nervousness.  Specifically no history of cardiac, pulmonary issues.  She continues to have menses.  All other systems reviewed and are negative unless noted above or on today's patient questionnaire (which will be scanned into the medical records).    PAST MEDICAL/SURGICAL HISTORY  Patient Active Problem List   Diagnosis   . Depression, major, recurrent   . Severe obesity, BMI 57    . Chronic knee pain   . Chronic back pain   . Senile nuclear cataract   . Myopia with astigmatism and presbyopia  Tubal ligation      ALLERGIES    Review of patient's allergies indicates no known allergies.    MEDICATIONS    Outpatient Prescriptions Prior to Visit   Medication Sig Dispense Refill   . Acetaminophen 500 MG Oral Tab Take 1 tablet by mouth every 6 hours if needed to relieve pain 60 Tab 0   . Atorvastatin Calcium 10 MG Oral Tab Take 1 tablet (10 mg) by mouth daily. 90 tablet 3   . BAYER ASPIRIN OR PRN migraines     . FISH OIL  None Entered     . FLUoxetine HCl  20 MG Oral Tab Take 3 tablets (60 mg) by mouth at 8 PM . 90 tablet 3   . Ibuprofen 600 MG Oral Tab Take 1 tablet (600 mg) by mouth every 6 hours as needed for pain or inflammation. Take with food and plenty of water. 90 tablet 0   . MetroNIDAZOLE 500 MG Oral Tab Take 1 tablet by mouth every 8 hours for infection, until all medication is taken 14 Tab 0   . Nystatin 100000 UNIT/GM External Cream Apply to rash underneat breasts 2 times a day 30 g 5   . Nystatin 100000 UNIT/GM External Powder Apply underneath breasts 2-3 times a day for fungal rash 60 g 2   . TraZODone HCl 50 MG Oral Tab Take 1 tablet by mouth at night at bedtime as needed (last time use with couple months ago)  90 Tab 2     No facility-administered medications prior to visit.     SOCIAL HISTORY  She is currently homeless.  She is  divorced.  Has 4 children not living with her.  They're the ages of 28, 59, 78, and 84.  She works part-time as a Clinical research associate.  She works day shifts only.  Denies alcohol, never smoked, no illicit drugs.  Shila Kruczek has at most one cup of coffee after she wakes up.    FAMILY HISTORY  No family history of sleep related breathing disorders.    PHYSICAL EXAM  Filed Vitals:    09/19/14 0851   BP: 121/74   Pulse: 75   Temp: 97.5 F (36.4 C)   TempSrc: Temporal   Height: 5' 4.17" (1.63 m)   Weight: 332 lb 12.8 oz (150.957 kg)   SpO2: 98%    end-tidal CO2-43  Neck size: 18 inches  BMI: 57  Weight: Denies weight changes over the last few years.  Is awaiting for appointment at the weight loss clinic.    GEN: Well developed well nourished, pleasant, NAD, pungent sour odor upon entry into room.  HEENT:  Sclerae anicteric.  No ptosis.     Nasal turbinate hypertrophy: 1+.    Nasal septal deviation: no.   Nasal airflow patency: good, bilateral.   Modified Mallampati: 3.   Tonsils: 1.     Tongue scalloping: no, moderate size.   Retrognathia / micrognathia / prognathia: no.   Palate: soft palate nearly touching posterior oropharynx, not high arched.     Dentition: very poor, has 4 eroded mandibular teeth, has dentures.     TMJ non-tender.  LYMPH: No cervical or clavicular adenopathy.   CV: S1S2, RRR, 3/6 sem radiating to carotids, 1+ bilateral ankle edema.   RESP:  CTAB, no w/r/r  ABD: SNTND  EXT: No clubbing/cyanosis.   NEURO: alert, appropriately conversant, speech fluent, PERRL, EOMI, face symmetric, uvula/tongue midline, MAEW, normal gait.  PSYCH: Appropriate affect.    LABS/IMAGING:  07/13/11 - bicarb 29, Scr 0.74  11/07/13 - bicarb 30, Scr 0.66    ASSESSMENT  49 year old woman with history of depression, anxiety, and significantly elevated BMI of 57 who presents for evaluation of sleep apnea.    Patient primary interested in etiology of moaning during sleep.  This sounds like  catathrenia during which there is prolonged  exhalation and airway noise.  I did explain to her that noises could also be a manifestation of sleep apnea since she has several significant risk factors, primarily significantly elevated BMI.  She does not have any other subjective sleep  symptoms.  End-tidal and saturations does not support daytime hypoventilation (risk factor - significant elevated BMI), although previously bicarbonates have been mildly elevated.  We discussed sleep apnea, associated health risks, and testing.  She agrees to have a sleep study, and is best studied in the lab due to risk of nocturnal hypoventilation.  She also agrees to try CPAP if indicated.  Agree with pursuing evaluation with weight loss clinic.      PLAN  1. Split night polysomnography with CO2 monitoring.  If indicated, the patient agrees to a trial of PAP therapy.    2. Evaluation at weight loss clinic.  3. Return to clinic in 4-6 weeks after sleep study and PAP trial, or sooner if needed.     The patient was seen, examined and the assessment and plan was formulated with Dr. Arlina Robes.    Thank you for the opportunity to participate in this patient's care.    CC:  Eldred Manges, ARNP

## 2014-09-19 NOTE — Progress Notes (Signed)
I saw and evaluated the patient and agree with the resident's note.

## 2014-09-19 NOTE — Patient Instructions (Signed)
Summary of today's visit:  1.  Schedule your sleep study.  We will notify you of the sleep study results, which can take up to several weeks.  If you have sleep apnea, we will set up a PAP (Positive Airway Pressure) device for you to try if that is what you decided.  The device will be provided by a DME ([facility]) company.  Start using your PAP device every time you sleep.      2.  Its possible you may have a condition called "catathrenia" which is moaning during sleep.  This is a variation of normal breathing.     Follow-up:    1.  Return to clinic to see Dr. Yvone Neu Risha Barretta in about 4-6 weeks to go over sleep study results in detail and discuss treatment options.  If you decided to already started PAP therapy, please bring the device with you to the next appointment.        To meet PAP compliance requirements (for insurance to pay for the device):   -You will need to return to clinic in 4-6 weeks after receiving the device.   -The device needs to be used at least 4 hours per night for 70% of nights per month.    If you have any questions or concerns or need an appointment sooner, please contact the Call Center at [phone].

## 2014-09-19 NOTE — Telephone Encounter (Signed)
LVM returning patient's call. Scheduled patient for bariatric seminar on 09/26/14 as requested, reminder mailed. Patient advised to call back if that doesn't work or if she would prefer to do it online.

## 2014-09-19 NOTE — Progress Notes (Signed)
Etco2 measured 43 mm Hg.  No respiratory distress noted.  No other test performed at this time.

## 2014-09-26 ENCOUNTER — Ambulatory Visit: Payer: Medicare HMO | Attending: Naturopath

## 2014-10-17 ENCOUNTER — Ambulatory Visit: Payer: Medicare HMO | Attending: Physician Assistant | Admitting: Physician Assistant

## 2014-10-17 ENCOUNTER — Encounter (HOSPITAL_BASED_OUTPATIENT_CLINIC_OR_DEPARTMENT_OTHER): Payer: Self-pay | Admitting: Physician Assistant

## 2014-10-17 VITALS — BP 101/66 | HR 74 | Temp 98.6°F | Ht 64.0 in | Wt 331.4 lb

## 2014-10-17 DIAGNOSIS — E785 Hyperlipidemia, unspecified: Secondary | ICD-10-CM | POA: Insufficient documentation

## 2014-10-17 DIAGNOSIS — Z6841 Body Mass Index (BMI) 40.0 and over, adult: Secondary | ICD-10-CM | POA: Insufficient documentation

## 2014-10-17 DIAGNOSIS — E782 Mixed hyperlipidemia: Secondary | ICD-10-CM

## 2014-10-17 NOTE — Progress Notes (Signed)
BARIATRIC CLINIC OUTPATIENT CONSULTATION    CHIEF COMPLAINT: New Patient Consult       HISTORY OF PRESENT ILLNESS:  Holly Blair is a 49 year old female referred by another provdier: Orson Gear for evaluation of morbid obesity and for consideration for a surgical weight loss procedure. The patient has a height of Ht 5\' 4"  (1.626 m) and weight of Wt 331 lb 6.4 oz (150.322 kg) resulting in a Body mass index is 56.86 kg/(m^2).Marland Kitchen The patient has attended one of our informational seminars where information on various weight loss procedures and obesity were discussed.  The patient is interested in a sleeve gastrectomy.  The patient has tried multiple methods of non-surgical weight loss in the past, including BEHAVIOR MODIFICATION and MODERATION.  She hasn't been able to lose much weight with these attempts; "maybe a few pounds". According to her flowsheet, she has been around this same weight give or take 13 pounds for the past 2-3 years. Her highest weight was 345 in 2014.  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 She also takes the public bus to her appointments hee at the [facility] which involves bus changes and walking from one stop to the next.  The patient's dietary habits were/are significant for sugar soda, some sweets.  The patient feels that the major cause for weight gain is candy, cookies. Patients says she was thin while in HS but started to gain weight after having her first child at age 69. She is not sure why she is so overweight. It was difficult to obtain accurate food recall as she was relating what she has been trying to eat lately rather than what she actually eats or has eaten most of her life.     The patient suffers from several comorbid conditions associated with morbid obesity.  These include hyperlipidemia and osteoarthritis.  She  indicates that she has undergone no additional testing in the past.  The patient doesn't confirm a history of gallbladder disease.      Outside records were reviewed.  Relevant workup to date is summarized below:  Labs: Date: 10/2013: LDL 138,HDL 51, Non-HDL 161; CMP normal;HgbA1C 5.3; TSH 2.4       Past Medical History:   Past Medical History   Diagnosis Date   . Depression, major, recurrent    . Acne    . Obesity    . Chronic knee pain    . Chronic back pain    . Anxiety state, unspecified    . Cataract    . Arthritis    . Heart murmur       Patient Active Problem List   Diagnosis   . Depression, major, recurrent   . Acne   . Severe obesity   . Chronic knee pain   . Chronic back pain   . Senile nuclear cataract   . Myopia with astigmatism and presbyopia   . Mental developmental delay   . Body mass index 50.0-59.9, adult         Past Surgical History:   Past Surgical History   Procedure Laterality Date   . Lig/trnsxj flp tube abdl/vag appr uni/bi          Family and Social History:   family history includes Cancer in her mother; Hypertension in her maternal grandfather.   History  Substance Use Topics   . Smoking status: Never Smoker    . Smokeless tobacco: Never Used   . Alcohol Use: No         Active Meds:   Current Outpatient Prescriptions   Medication Sig Dispense Refill   . Acetaminophen 500 MG Oral Tab Take 1 tablet by mouth every 6 hours if needed to relieve pain 60 Tab 0   . Atorvastatin Calcium 10 MG Oral Tab Take 1 tablet (10 mg) by mouth daily. 90 tablet 3   . BAYER ASPIRIN OR PRN migraines     . FISH OIL  None Entered     . FLUoxetine HCl 20 MG Oral Tab Take 3 tablets (60 mg) by mouth daily. 90 tablet 3   . Ibuprofen 600 MG Oral Tab Take 1 tablet (600 mg) by mouth every 6 hours as needed for pain or inflammation. Take with food and plenty of water. 90 tablet 0   . MetroNIDAZOLE 500 MG Oral Tab Take 1 tablet by mouth every 8 hours for infection, until all medication is taken 14 Tab 0   . Nystatin 100000  UNIT/GM External Cream Apply to rash underneat breasts 2 times a day 30 g 5   . Nystatin 100000 UNIT/GM External Powder Apply underneath breasts 2-3 times a day for fungal rash 60 g 2   . TraZODone HCl 50 MG Oral Tab Take 1 tablet by mouth at night at bedtime as needed 90 Tab 2     No current facility-administered medications for this visit.         Allergies:   Review of patient's allergies indicates:  No Known Allergies     Review of Systems:   Positive for OA right knee, Hyperlipidemia,anxiety,insomnia,heart murmur, developmental disability .  Full 14 system review of systems was reviewed with the patient and otherwise negative except as outlined above and noted in HPI.    STOP BANG today=Has sleep study on May 20th , 2016    Physical Exam:   PHYSICAL EXAM:  BP 101/66 mmHg  Pulse 74  Temp(Src) 98.6 F (37 C) (Temporal)  Ht 5\' 4"  (1.626 m)  Wt 331 lb 6.4 oz (150.322 kg)  BMI 56.86 kg/m2  SpO2 98% Ht 5\' 4"  (1.626 m) Wt 331 lb 6.4 oz (150.322 kg) Body mass index is 56.86 kg/(m^2).  GENERAL: well-developed, well-nourished, obese female, in no apparent distress.  HEENT: normocephalic, atraumatic head. oropharynx is clear without masses or lesions noted. sclerae are anicteric. Pupils equal, round, and reactive to light and accommodation. Extraocular muscles are intact. Missing teeth  NECK: supple nontender neck and no palpable cervical lymphadenopathy.  RESPIRATORY: Lungs are clear to auscultation bilaterally; the patient has normal respiratory effort on room air.  CARDIOVASCULAR: regular rate and rhythm. No murmurs, rubs, or gallops. The heart sounds are distant due to body habitus. Intact peripheral pulses  ABDOMEN: nontender, nondistended, obese abdomen. no palpable masses or organomegaly. Patient's weight is carried very low with a large, pendulous lower abdomen  GU: no back/flank tenderness  SKIN: no rashes, bruises, no cyanosis, clubbing, or edema.  NEUROLOGIC: awake, alert, and oriented times 3. Normal  gait and stance.  EXTREMITIES: warm and well perfused extremities, moves all extremities well.           ASSESSMENT:  Holly Blair is a 49 year old female who is appropriate for either weight loss procedure. I have some concerns about her understanding and readiness for the changes necessary for success.  After discussing this with our social workers, we decided to enroll her in the program and start with visits to the social worker and dietician. If after these visits we agree that she can move forward in the program, we will refer for the necessary evaluations. I will also contact he PCP and suggest regular visits to a dietician if possible.  She will also need to show progression in her weight loss during this time.     PLAN:  1. She has tried multiple methods of weight loss in the past and has not had success.  She has attended one of our informational seminars and came today interested in sleeve gastrectomy.  We had a lengthy discussion today where we went over the risks, benefits, expected outcomes, weight loss, and lifestyle changes associated with these operations. The risks discussed include but are not limited to bleeding, bruising, pain, infection, scarring, anastomotic leak, bleeding, stricture, MI, PE, stroke, infection, malnutrition, increased incidence of alcohol addiction and dumping syndrome.  Other complications can include bowel obstruction, chronic nausea and the need for prolonged nutritional supplementation.  The patient understands that these risks and complications can lead to readmission, reoperation, and even death.  The patient understood our discussion today and wishes to proceed.  We answered all questions in detail and to the patient's satisfaction.  The patient was unaccompanied today at the time of our consultation.   2. Holly Blair will be enrolled in our program today and we will pursue a multidisciplinary workup to evaluate for candidacy of bariatric surgery.  This  workup will consist of the following: social work consult and nutrition consult.    3. Once this workup is completed, the patient will return to our clinic for re-assessment by one of the surgeons.  At that time, based upon the results of this workup, we will make a decision on whether or not to pursue surgery.    4. If both social work and nutrition agree that she is an appropriate candidate, we will put in the needed referrals at that time.   5.

## 2014-10-17 NOTE — Patient Instructions (Signed)
You will be enrolled in the [facility] bariatrics program today.  You will be scheduled initially for a visit with our social worker and dietician as outlined on your after visit summary.  You will be contacted to schedule further appointments if appropriate  Increase aerobic exercise to at least 150 minutes per week.  Keep a log of all exercise to bring with you to all visits.    Please make diet changes and exercise to lose as much as weight as possible prior to surgery, at least 30 pounds.

## 2014-11-01 ENCOUNTER — Encounter (HOSPITAL_BASED_OUTPATIENT_CLINIC_OR_DEPARTMENT_OTHER): Payer: Medicare HMO | Admitting: Naturopath

## 2014-11-07 ENCOUNTER — Encounter (HOSPITAL_BASED_OUTPATIENT_CLINIC_OR_DEPARTMENT_OTHER): Payer: Medicare HMO | Admitting: Naturopath

## 2014-11-08 ENCOUNTER — Ambulatory Visit (HOSPITAL_BASED_OUTPATIENT_CLINIC_OR_DEPARTMENT_OTHER): Payer: Medicare HMO | Attending: Sleep Medicine

## 2014-11-08 DIAGNOSIS — G4733 Obstructive sleep apnea (adult) (pediatric): Secondary | ICD-10-CM

## 2014-11-12 ENCOUNTER — Encounter (HOSPITAL_BASED_OUTPATIENT_CLINIC_OR_DEPARTMENT_OTHER): Payer: Medicare HMO | Admitting: Naturopath

## 2014-11-14 ENCOUNTER — Encounter (HOSPITAL_BASED_OUTPATIENT_CLINIC_OR_DEPARTMENT_OTHER): Payer: Medicare HMO | Admitting: Internal Medicine

## 2014-11-14 ENCOUNTER — Telehealth (HOSPITAL_BASED_OUTPATIENT_CLINIC_OR_DEPARTMENT_OTHER): Payer: Self-pay | Admitting: Internal Medicine

## 2014-11-14 NOTE — Telephone Encounter (Signed)
CONFIRMED PHONE NUMBER: [phone]    CALLERS FIRST AND LAST NAME: Holly Blair  FACILITY NAME: na TITLE: na  CALLERS RELATIONSHIP:Self  RETURN CALL: OK to leave detailed message with anyone that answers     [facility] ONLY) Texting is an option for this clinic. If you would like [place] to use this option, which mobile phone number should we text to if we are unable to reach you?    SUBJECT: Cancellation/Reschedule 11/14/2014 at 10:00 AM with DR. He  REASON FOR CANCELLATION: Sick  RESCHEDULED: YES, Date 12/12/2014, Time 10:00 AM

## 2014-11-28 ENCOUNTER — Telehealth (HOSPITAL_BASED_OUTPATIENT_CLINIC_OR_DEPARTMENT_OTHER): Payer: Self-pay | Admitting: Internal Medicine

## 2014-11-28 NOTE — Telephone Encounter (Signed)
CONFIRMED PHONE NUMBER: [phone]  CALLERS FIRST AND LAST NAME: Darnelle Maffucci  FACILITY NAME: na TITLE: na  CALLERS RELATIONSHIP:Self  RETURN CALL: Detailed message on voicemail only     ([facility]) Texting is an option for this clinic. If you would like [place] to use this option, which mobile phone number should we text to if we are unable to reach you?    SUBJECT: General Message   REASON FOR REQUEST: Patient states Dr. He needs to call [place] healthcare at [phone] so she can approval for her Cpap    MESSAGE: Please call.

## 2014-11-28 NOTE — Telephone Encounter (Signed)
Routing to [facility] sleep Medicine clinical pool.

## 2014-12-02 NOTE — Telephone Encounter (Signed)
Spoke with [facility] - they stated that there were no issues with the patient's insurance on their end and that patient is being set up today.

## 2014-12-10 ENCOUNTER — Ambulatory Visit: Payer: Medicare HMO | Attending: Naturopath | Admitting: Naturopath

## 2014-12-10 VITALS — BP 131/81 | HR 58 | Wt 327.4 lb

## 2014-12-10 DIAGNOSIS — B369 Superficial mycosis, unspecified: Secondary | ICD-10-CM | POA: Insufficient documentation

## 2014-12-10 DIAGNOSIS — B372 Candidiasis of skin and nail: Secondary | ICD-10-CM | POA: Insufficient documentation

## 2014-12-10 DIAGNOSIS — R1011 Right upper quadrant pain: Secondary | ICD-10-CM | POA: Insufficient documentation

## 2014-12-10 LAB — COMPREHENSIVE METABOLIC PANEL
ALT (GPT): 14 U/L (ref 7–33)
AST (GOT): 14 U/L (ref 9–38)
Albumin: 3.9 g/dL (ref 3.5–5.2)
Alkaline Phosphatase (Total): 108 U/L (ref 34–121)
Anion Gap: 6 (ref 4–12)
Bilirubin (Total): 0.7 mg/dL (ref 0.2–1.3)
Calcium: 9.6 mg/dL (ref 8.9–10.2)
Carbon Dioxide, Total: 29 meq/L (ref 22–32)
Chloride: 100 meq/L (ref 98–108)
Creatinine: 0.71 mg/dL (ref 0.38–1.02)
GFR, Calc, African American: >60 mL/min (ref >59)
GFR, Calc, European American: >60 mL/min (ref >59)
Glucose: 90 mg/dL (ref 62–125)
Potassium: 4.4 meq/L (ref 3.6–5.2)
Protein (Total): 7.7 g/dL (ref 6.0–8.2)
Sodium: 135 meq/L (ref 135–145)
Urea Nitrogen: 11 mg/dL (ref 8–21)

## 2014-12-10 LAB — CBC, DIFF
% Basophils: 0 %
% Eosinophils: 1 %
% Lymphocytes: 28 %
% Monocytes: 6 %
% Neutrophils: 65 %
Absolute Eosinophil Count: 0.12 10*3/uL (ref 0.00–0.50)
Absolute Lymphocyte Count: 3.42 10*3/uL (ref 1.00–4.80)
Basophils: 0 10*3/uL (ref 0.00–0.20)
Hematocrit: 36 % (ref 36–45)
Hemoglobin: 11.6 g/dL (ref 11.5–15.5)
MCH: 27.8 pg (ref 27.3–33.6)
MCHC: 31.9 g/dL — ABNORMAL LOW (ref 32.2–36.5)
MCV: 87 fL (ref 81–98)
Monocytes: 0.73 10*3/uL (ref 0.00–0.80)
Neutrophils: 7.94 10*3/uL — ABNORMAL HIGH (ref 1.80–7.00)
Platelet Count: 300 10*3/uL (ref 150–400)
RBC: 4.17 10*6/uL (ref 3.80–5.00)
RDW-CV: 14.9 % — ABNORMAL HIGH (ref 11.6–14.4)
WBC: 12.21 10*3/uL — ABNORMAL HIGH (ref 4.3–10.0)

## 2014-12-10 LAB — AMYLASE: Amylase Total: 21 U/L — ABNORMAL LOW (ref 27–106)

## 2014-12-10 NOTE — Progress Notes (Signed)
SUBJECTIVE    Holly Blair is an 49 year old female who presents for evaluation of abdominal   pain.     HPI:  The pain is RUQ without radiation, and she describes it as dull and pressure like pain. It started  2 months ago, is 3/10 in intensity, and has been unchanged since it started. It is made worse if anything touches it, and relieved by nothing. The pain is associated with mild gas, and patient denies nausea, vomiting, diarrhea, constipation, fever and chest pain.  She is starting to work with the [facility] and has been trying to cut out sugar in her diet and exercise more.     Known triggers or predisposing factors: none  Recent changes in bowel movements:  NO  Recent diarrhea:  NO  Recent constipation:  NO  Recent nausea/vomiting:  NO  Recent medication/supplement changes:  NO  Recent history of travel: NO.   Prior history of similar symptoms:   NO  Previous work up and treatment:   NO  Personal or family history of IBD/GI malignancy:   NO    Patient Active Problem List   Diagnosis   . Depression, major, recurrent ([place])   . Acne   . Severe obesity   . Chronic knee pain   . Chronic back pain   . Senile nuclear cataract   . Myopia with astigmatism and presbyopia   . Mental developmental delay   . Body mass index 50.0-59.9, adult ([place])   . Morbid obesity with BMI of 50.0-59.9, adult ([place])   . Hyperlipidemia       Review of patient's allergies indicates:  No Known Allergies    Current Outpatient Prescriptions   Medication Sig Dispense Refill   . Acetaminophen 500 MG Oral Tab Take 1 tablet by mouth every 6 hours if needed to relieve pain 60 Tab 0   . Atorvastatin Calcium 10 MG Oral Tab Take 1 tablet (10 mg) by mouth daily. 90 tablet 3   . FISH OIL  None Entered     . FLUoxetine HCl 20 MG Oral Tab Take 3 tablets (60 mg) by mouth daily. 90 tablet 3   . Ibuprofen 600 MG Oral Tab Take 1 tablet (600 mg) by mouth every 6 hours as needed for pain or inflammation. Take with food and plenty of water.  90 tablet 0   . Multiple Vitamin (MULTI VITAMIN DAILY OR) Take by mouth daily.     Marland Kitchen Nystatin 100000 UNIT/GM External Cream Apply to rash underneat breasts 2 times a day 30 g 5   . Nystatin 100000 UNIT/GM External Powder Apply underneath breasts 2-3 times a day for fungal rash 60 g 2   . TraZODone HCl 50 MG Oral Tab Take 1 tablet by mouth at night at bedtime as needed 90 Tab 2     No current facility-administered medications for this visit.            ROS:   Constitutional: No fever, dizziness or headache   Throat: no pain or tonsil enlargement   Cardiovascular: No chest pain or SOB     OBJECTIVE:  BP 131/81 mmHg  Pulse 58  Wt 327 lb 6.4 oz (148.508 kg)  General appearance: healthy, alert, no distress  Abdomen:  Patient is morbidly obese  Auscultation:normal bowel sounds.   Palpation:  soft, nontender, no palpable masses, no organomegaly  Skin: satellite lesions under abdomen on right side    ASSESSMENT/PLAN:   [name]  was seen today for hernia evaluation.    Diagnoses and all orders for this visit:    Right upper quadrant pain  Orders:  -     Cancel: ULTRASOUND ABDOMINAL R-T W/IMAGE LIMITED  -     COMPREHENSIVE METABOLIC PANEL  -     CBC, DIFF  -     AMYLASE    Fungal Rash  Clotrimazole cream followed by Nystatin powder twice daily

## 2014-12-11 ENCOUNTER — Telehealth (HOSPITAL_BASED_OUTPATIENT_CLINIC_OR_DEPARTMENT_OTHER): Payer: Self-pay | Admitting: Naturopath

## 2014-12-11 ENCOUNTER — Ambulatory Visit: Payer: Medicare HMO | Attending: Surgery | Admitting: Clinical

## 2014-12-11 ENCOUNTER — Ambulatory Visit (HOSPITAL_BASED_OUTPATIENT_CLINIC_OR_DEPARTMENT_OTHER): Payer: Medicare HMO | Admitting: Registered"

## 2014-12-11 VITALS — Wt 323.5 lb

## 2014-12-11 DIAGNOSIS — Z658 Other specified problems related to psychosocial circumstances: Secondary | ICD-10-CM

## 2014-12-11 DIAGNOSIS — Z713 Dietary counseling and surveillance: Secondary | ICD-10-CM | POA: Insufficient documentation

## 2014-12-11 DIAGNOSIS — Z6841 Body Mass Index (BMI) 40.0 and over, adult: Secondary | ICD-10-CM | POA: Insufficient documentation
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
## 2014-12-11 NOTE — Progress Notes (Signed)
[facility]  Initial Nutrition Evaluation for Bariatric Surgery    Referred by:Kim Arlan Organ, PA-C  Time spent with patient: 60  minutes  Medical Diagnosis: Morbid obesity  Past medical history significant for:   Active Ambulatory Problems     Diagnosis Date Noted   . Depression, major, recurrent ([place])    . Acne    . Severe obesity    . Chronic knee pain    . Chronic back pain    . Senile nuclear cataract 07/13/2011   . Myopia with astigmatism and presbyopia 07/13/2011   . Mental developmental delay 03/22/2012   . Body mass index 50.0-59.9, adult ([place]) 12/25/2012   . Morbid obesity with BMI of 50.0-59.9, adult ([place]) 10/17/2014   . Hyperlipidemia 10/17/2014     Resolved Ambulatory Problems     Diagnosis Date Noted   . No Resolved Ambulatory Problems     Past Medical History   Diagnosis Date   . Obesity    . Anxiety state, unspecified    . Cataract    . Arthritis    . Heart murmur        Weight today: 323.5 lb  BMI: Estimated body mass index is 56.17 kg/(m^2) as calculated from the following:    Height as of 10/17/14: 5\' 4"  (1.626 m).    Weight as of 12/10/14: 327 lb 6.4 oz (148.508 kg).  Initial weight:  331.4# lb Date: 10/17/14  Estimated protein needs: 80 g (based on 20% of recommended kcals from protein)  Labs: Date: 10/2013: LDL 138,HDL 51, Non-HDL 161; CMP normal;HgbA1C 5.3; TSH 2.4    Patient Interview  Motivation for weight loss surgery: improved self-esteem  Previous weight loss attempts: water aerobics helped, hasn't tried anything dietary wise to    Knowledge: pureed foods after bypass, small amounts   Patient desiring RYGB to improve health and mobility  Patient has been meeting with a local dietitian for diet education. Pt is able to identify the following concepts about nutrition: increased water, smaller portions, whole grains, cut out sugars, increased veggies, label readings   Currently eating habits/routine:   Wake-up: 6:30am   B: 7:30am: bowl of raisin bran 1/2 cup + 1/2 cup low fat  milk + bagel whole wheat w/pb (1tsp)  S: piece of fruit  L 12:30pm: 1/2 sandwich [place] on whole wheat w/mayo + fruit   S 4pm: 1/3 cup mixed nuts   D 5:00pm: small bowl of chicken with brown rice and vegetables - homemade OR salad (or teriyaki place)   S: occasional - yogurt parfait   Bed: 9-10pm goes to sleep by 11    Common beverages: water - 4 bottles per day (32 ounces each), zero calorie vitamin water      Food allergies: nkfa  Food Intolerances/Avoidances: spinach  Living Situation/Grocery shopping/Cooking: living with a friend (she is focused on eating healthy and has lost weight) - on a waiting list for housing, self - [employer] or [employer], cooks own foods   Alcohol: only on occasions   Smoking: none    Hx of eating disorder/significant emotional/binge eating: none   Mental Health history/counseling:   Sleep: uses cpap recently got, 7 hours/night   Vitamins/minerals: MVI adult generic [employer]   Digestion: none   Exercise: water aerobics/lap swimming - nothing regularly, walking but not very long periods of time r/t OA   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 Pt has not made substantial efforts to lose weight through dietary changes in the past. She has been changing diet recently with decreased portions and increased vegetables with success in losing weight. Encouraged pt to keep food records and track kcals 3 days/week to monitor intake.     Reviewed Life-long Lifestyle and Diet Changes:  Requirement for life-long change in eating habits to begin now  Life-long intake of specific vitamins and minerals  Risk of Dumping Syndrome  Importance of exercise beyond daily ADL's    No interpretive services needed. Education was provided taking into consideration the patient's learning abilities, readiness to learn and cultural  beliefs. The patient was able to verbalize understanding of material reviewed below and they are ready to start changing habits. Pt attended alone today.    Medical Nutrition Therapy Goals: Long term weight management and improved health  Patient Goals:   See "Patient Instructions" for full nutrition education details    Patient appears to be acceptable nutritionally to progress through the weight loss surgery program at this time

## 2014-12-11 NOTE — Telephone Encounter (Signed)
Pt came in yesterday forgot to ask Elan for an Rx for a diet pill she would like to take called Phesantermine.

## 2014-12-11 NOTE — Telephone Encounter (Signed)
Holly Blair reports that she is not having abdominal pain today.

## 2014-12-11 NOTE — Telephone Encounter (Signed)
Radiology scheduling is calling with questions about abdominal ultrasound that is ordered. Does Holly Blair want to r/o hernia? Is there a specific organ that she wants imaged? Please include in order.

## 2014-12-11 NOTE — Patient Instructions (Signed)
Begin healthy lifestyle changes including regular exercise and wise food choices starting on page 45 in, "Guide to Your Weight Loss Surgery" Book"    HEALTHY EATING HABITS TO START OR CONTINUE  Chew food well  - Take 20-40 minutes to eat a meal or snack  - Eat 4-6 small meals/snacks daily  - Try to chew each bite 20 times or until it feels like applesauce    Keep Food Records (Be Mindful of Calories from all sources)   - Use My Fitness Pal, Lose It, Calorie Count, Fat Secret, Calorie King or similar app on your phone or computer as a food journal with a     - Goal of 1600 - 1800 calories daily for weight loss prior to surgery   - Start tracking every single item you eat or drink for 7 days, include a weekend.   - Measure all food, dips, sauces, liquids, sugar, etc   - Be sure to choose the correct portion size from the selections   - Don't add in the calories that you have burned during exercise   - The goal is to continue to lose weight while meeting nutrition needs    My Plate Method   [email or web address].hu   - Use an 8 inch plate or salad plate for meals   - 1/4 plate is a lean protein   - 1/2 plate is vegetables (non-starchy, such as broccoli, peppers, salad)   - 1/4 plate is a fruit, starchy vegetable (potatoes, corn, squash), beans, or grains    PROTEIN  Eat protein to provide at least 80g protein daily  - Aim for at least 15 - 20g at each meal  - Lean, non-breaded, not deep-fried proteins  - Baked or grilled fish, seafood, poultry, pork, beef, eggs   - 1 oz of these meats provides 7g of protein   - 3 oz (size of a deck of playing cards) = 21g of protein   - Low-fat cottage cheese, yogurt, or low-fat milk or soy milk               -  cup to 1 cup provides 10g (read labels for most accurate)   - Tofu, tempeh, edamame (soybeans), beans, peas, lentils    -  cup provides about 10-15g of protein   - Nuts, seeds, peanut butter, almond butter, hummus    - 2 Tb provides about 5-7g of protein    Find a protein  drink or powder you like prior to surgery    - After surgery goal for protein remains 60-80 grams per day    - First 2 weeks after surgery includes only liquid or pureed foods    DUMPING SYNDROME  For continued weight loss and to avoid dumping syndrome post surgery:    - Choose fewer sugar sweetened foods, beverages, desserts, candies   - Eliminate high fat cream sauces or fried foods (healthy fats such as avocado,                           olive oil, nuts/seeds are okay)   - No liquids 20 minutes before, during or after meal for 20 minutes (to avoid   dumping syndrome)    Fluids: Drink a total of 64 oz daily of non-calorie, non-carbonated, liquids   - Avoid straws   - Practice small sips throughout the day and no liquids 20 minutes before, during or after meal for 20 minutes    -  Work towards decreasing carbonated and caffeinated beverages (these are not allowed the first 6 weeks post surgery)    WEIGHT LOSS  Increase intensity of physical activity for 20 to 30 minutes daily or 150 minutes each week    Weight goal 301 lb prior to possible approval for per [facility] - Eben Burow, PA-C     If able, continue meeting with a dietitian near home to learn more about a balanced and healthy diet and support in meeting weight loss goal    FOLLOW-UP  Low Calorie Diet Class (at Test Review appointment) if approved for surgery and after completion of all required tests   - Bring support person that will be assisting with food preparation after surgery   - Review puree diet for the first 2-3 weeks after surgery on page 56 in, "Guide to      Your Weight Loss Surgery" Book"  At scheduled post-operative visits you will be meeting with a dietitian to ensure meeting nutrition requirements including fluids and protein as well as proper diet advancement   - 2 weeks, 6 weeks, 3 months, 6 months, 9 months, Yearly     Feel free to contact RD with questions or concerns via [employer] or email: [email or web address] or call the clinic at [phone]

## 2014-12-11 NOTE — Telephone Encounter (Signed)
Holly Blair would like to start taking Phesantermine for weight loss. She reports that she has discussed this with [facility]. I do not see this mentioned in the 12/11/14 notes. Will route to [name] in [facility] to clarify if Phesantermine is recommended.  Appointment 12/20/14 with [name].

## 2014-12-12 ENCOUNTER — Encounter (HOSPITAL_BASED_OUTPATIENT_CLINIC_OR_DEPARTMENT_OTHER): Payer: Medicare HMO | Admitting: Internal Medicine

## 2014-12-12 MED ORDER — CLOTRIMAZOLE 1 % EX CREA
TOPICAL_CREAM | Freq: Two times a day (BID) | CUTANEOUS | Status: DC
Start: 2014-12-12 — End: 2015-07-15

## 2014-12-12 MED ORDER — NYSTATIN 100000 UNIT/GM EX POWD
CUTANEOUS | Status: DC
Start: 2014-12-12 — End: 2015-07-15

## 2014-12-12 NOTE — Telephone Encounter (Signed)
Message left on voice mail to return my call.

## 2014-12-12 NOTE — Telephone Encounter (Signed)
If no pain lets hold off on [place] at this time

## 2014-12-12 NOTE — Telephone Encounter (Signed)
I will not do this.  She can get it through [facility].

## 2014-12-20 ENCOUNTER — Encounter (HOSPITAL_BASED_OUTPATIENT_CLINIC_OR_DEPARTMENT_OTHER): Payer: Medicare HMO | Admitting: Naturopath

## 2014-12-20 NOTE — Progress Notes (Addendum)
06/16/2016-As it has been over one year since patient was seen in this clinic, patient's file will be closed to social work case management at this time. If the patient wishes to pursue bariatric surgery in the future, a new psychosocial assessment would be required at that time.     Myer Haff, MSW    This Probation officer spoke with patient's case manager at [facility]: [place].  Per Ms Matthew, Holly Blair is new to her caseload and counselor does not know her well.  Ms. Malachy Chamber states she will have an updated care plan with patient in October and will assess patient's housing/ability to learn new things and adhere to advice at that time. This Probation officer outlined patient's deferral items for bariatric surgery and explained that patient might benefit from the non-surgical program while she establishes permanent housing and works to lose 30 pounds.  Ms. Malachy Chamber clarified that patient may call her for support or referral for resources as needed. Patient's deferral items are below and were given to patient in writing at original interview.       The patient is currently living at her friend's house, sleeping on the floor. She hopes to establish more permanent housing within the year. Patient understands that she is to have more permanent housing with support person established prior to advancing in the program.   Weight Management ARNP requested that patient lose 30 pounds prior to advancing in the program. Patient will call this writer when she is at or below 300 pounds.   Patient was strongly encouraged to consider the weight management, non-surgical approach to assist in losing the 30 pounds. That approach might serve to assist patient in building support, deeper understanding of historic issues with food and more durable weight loss prior to surgery.    Holly Blair, LICSW  [phone]

## 2014-12-24 ENCOUNTER — Encounter (HOSPITAL_BASED_OUTPATIENT_CLINIC_OR_DEPARTMENT_OTHER): Payer: Medicare HMO

## 2014-12-27 ENCOUNTER — Ambulatory Visit: Payer: Medicare HMO | Attending: Naturopath

## 2014-12-27 ENCOUNTER — Encounter (HOSPITAL_BASED_OUTPATIENT_CLINIC_OR_DEPARTMENT_OTHER): Payer: Medicare HMO

## 2014-12-27 DIAGNOSIS — R1011 Right upper quadrant pain: Secondary | ICD-10-CM

## 2014-12-27 DIAGNOSIS — R935 Abnormal findings on diagnostic imaging of other abdominal regions, including retroperitoneum: Secondary | ICD-10-CM | POA: Insufficient documentation

## 2014-12-29 ENCOUNTER — Encounter (HOSPITAL_BASED_OUTPATIENT_CLINIC_OR_DEPARTMENT_OTHER): Payer: Self-pay | Admitting: Naturopath

## 2014-12-30 ENCOUNTER — Other Ambulatory Visit (HOSPITAL_BASED_OUTPATIENT_CLINIC_OR_DEPARTMENT_OTHER): Payer: Self-pay | Admitting: Naturopath

## 2014-12-30 DIAGNOSIS — D729 Disorder of white blood cells, unspecified: Secondary | ICD-10-CM

## 2014-12-30 NOTE — Telephone Encounter (Signed)
Please call patient and let her know I will call her over my lunch hour tomorrow

## 2014-12-30 NOTE — Telephone Encounter (Signed)
LM at pt's identified vm of [employer] message below. If any further questions/conserns, pls call [facility] at [phone], option #8, aks to speak w/ Don Broach nurse.

## 2015-01-01 NOTE — Telephone Encounter (Signed)
Talked to Holly Blair on the phone.  I am not able to find in the note what she was referring to.  She is going to schedule a visit to come see me.

## 2015-01-02 ENCOUNTER — Ambulatory Visit (HOSPITAL_BASED_OUTPATIENT_CLINIC_OR_DEPARTMENT_OTHER): Payer: Medicare HMO | Admitting: Naturopath

## 2015-01-02 ENCOUNTER — Ambulatory Visit: Payer: Medicare HMO | Attending: Sleep Medicine | Admitting: Pulmonary Disease

## 2015-01-02 ENCOUNTER — Telehealth (HOSPITAL_BASED_OUTPATIENT_CLINIC_OR_DEPARTMENT_OTHER): Payer: Self-pay | Admitting: Naturopath

## 2015-01-02 ENCOUNTER — Encounter (HOSPITAL_BASED_OUTPATIENT_CLINIC_OR_DEPARTMENT_OTHER): Payer: Self-pay | Admitting: Naturopath

## 2015-01-02 VITALS — BP 108/58 | HR 65 | Ht 64.75 in | Wt 331.8 lb

## 2015-01-02 VITALS — BP 127/71 | HR 65 | Temp 98.6°F | Ht 64.0 in | Wt 332.0 lb

## 2015-01-02 DIAGNOSIS — G4733 Obstructive sleep apnea (adult) (pediatric): Secondary | ICD-10-CM | POA: Insufficient documentation

## 2015-01-02 DIAGNOSIS — R011 Cardiac murmur, unspecified: Secondary | ICD-10-CM

## 2015-01-02 DIAGNOSIS — D729 Disorder of white blood cells, unspecified: Secondary | ICD-10-CM | POA: Insufficient documentation

## 2015-01-02 DIAGNOSIS — I38 Endocarditis, valve unspecified: Secondary | ICD-10-CM | POA: Insufficient documentation

## 2015-01-02 LAB — CBC, DIFF
% Basophils: 0 %
% Eosinophils: 1 %
% Lymphocytes: 33 %
% Monocytes: 6 %
% Neutrophils: 60 %
Absolute Eosinophil Count: 0.14 10*3/uL (ref 0.00–0.50)
Absolute Lymphocyte Count: 4.51 10*3/uL (ref 1.00–4.80)
Basophils: 0 10*3/uL (ref 0.00–0.20)
Hematocrit: 35 % — ABNORMAL LOW (ref 36–45)
Hemoglobin: 11.2 g/dL — ABNORMAL LOW (ref 11.5–15.5)
MCH: 28.5 pg (ref 27.3–33.6)
MCHC: 32.2 g/dL (ref 32.2–36.5)
MCV: 89 fL (ref 81–98)
Monocytes: 0.82 10*3/uL — ABNORMAL HIGH (ref 0.00–0.80)
Neutrophils: 8.19 10*3/uL — ABNORMAL HIGH (ref 1.80–7.00)
Platelet Count: 244 10*3/uL (ref 150–400)
RBC: 3.93 10*6/uL (ref 3.80–5.00)
RDW-CV: 15 % — ABNORMAL HIGH (ref 11.6–14.4)
WBC: 13.66 10*3/uL — ABNORMAL HIGH (ref 4.3–10.0)

## 2015-01-02 NOTE — Progress Notes (Signed)
I saw and evaluated the patient and agree with the resident's note.

## 2015-01-02 NOTE — Progress Notes (Signed)
SLEEP CENTER FOLLOW-UP VISIT    REFERRING PROVIDER: No ref. provider found    ID/CC  Holly Blair is a 49 year old female is here for follow-up for sleep apnea.    She was last seen on 09/19/14 by Drs. He and Nicolasa Ducking, which was her initial visit. Briefly, she presented with complaints of nighttime moaning, unrefreshing sleep, and daytime sleepiness.     INTERVAL HISTORY  She had her sleep study on 5/26 which showed normal sleep architecture, RDI 37, AHI 22 (REM AHI 33.6), SpO2 nadir 85%, <90% burden 9.2%, ODI 22.8. She did not meet criteria for split-night. She finally received her PAP device last week due to insurance delays. She has had difficulty with the nasal pillows interface, finding it difficult to keep on, but otherwise is tolerating the pressure. She demonstrated this for [place] today in clinic and we recommended repositioning the head straps for a more secure fit. For these reasons, she has only used it for four days, for an average of 2.5 hours each. Despite this, she does feel that her sleep is more refreshing when she uses the device.    Otherwise, there are no changes in her sleep habits. She goes to bed at 9 and wakes at 6. Uses trazodone several nights per week.    She is homeless and staying with her ex-husband but is applying for her own housing. She has not yet been seen in weight loss clinic but is looking forward to this.    PAP Statistics:  DME: [facility]  Settings:  5-20 cmH2O with Airfit P10 mask (nasal pillows)  Usage:  Use on 5 out of last 30 days, mean use of 2 hr 40 min. Utilized the device for >=4hr on  0% of nights.    Pressures/Leak:  Median pressure is 8.5 cmH2O, pressure is <= 12.4cmH2O 90% or 95% of the time.  There is no excessive mask leak.  Residual AHI: 6.9    REVIEW OF SYSTEMS  No weight change, nasal congestion, dyspnea, chest pain, edema  All other systems reviewed and are negative unless noted above.    PROBLEM LIST  Patient Active Problem List   Diagnosis      . Depression, major, recurrent ([place])   . Acne   . Severe obesity   . Chronic knee pain   . Chronic back pain   . Senile nuclear cataract   . Myopia with astigmatism and presbyopia   . Mental developmental delay   . Body mass index 50.0-59.9, adult ([place])   . Morbid obesity with BMI of 50.0-59.9, adult ([place])   . Hyperlipidemia   . Pre-bariatric surgery nutrition evaluation     ALLERGIES    Review of patient's allergies indicates no known allergies.  MEDICATIONS    Outpatient Prescriptions Prior to Visit   Medication Sig Dispense Refill   . Acetaminophen 500 MG Oral Tab Take 1 tablet by mouth every 6 hours if needed to relieve pain 60 Tab 0   . Atorvastatin Calcium 10 MG Oral Tab Take 1 tablet (10 mg) by mouth daily. 90 tablet 3   . Clotrimazole 1 % External Cream Apply to affected area on abdomen 2 times a day. For fungal rash. 45 g 2   . FISH OIL  None Entered     . FLUoxetine HCl 20 MG Oral Tab Take 3 tablets (60 mg) by mouth daily. 90 tablet 3   . Ibuprofen 600 MG Oral Tab Take 1 tablet (600  mg) by mouth every 6 hours as needed for pain or inflammation. Take with food and plenty of water. 90 tablet 0   . Multiple Vitamin (MULTI VITAMIN DAILY OR) Take by mouth daily.     Marland Kitchen Nystatin 100000 UNIT/GM External Cream Apply to rash underneat breasts 2 times a day 30 g 5   . Nystatin 100000 UNIT/GM External Powder Apply underneath abdomen twice daily after Clotrimazole application 60 g 2   . TraZODone HCl 50 MG Oral Tab Take 1 tablet by mouth at night at bedtime as needed 90 Tab 2     No facility-administered medications prior to visit.     PHYSICAL EXAM  Filed Vitals:    01/02/15 1115   BP: 127/71   Pulse: 65   Temp: 98.6 F (37 C)   TempSrc: Temporal   Height: 5\' 4"  (1.626 m)   Weight: 332 lb (150.594 kg)   SpO2: 98%     Pleasant obese woman, alert and conversant. Somewhat narrow nasal passages but no occlusion and negative Cottle maneuver. Mallampati class III airway with 1+ tonsils. She has a soft 2/6 systolic heart  murmur.      ASSESSMENT  49 year old woman with morbid obesity and newly diagnosed obstructive sleep apnea, moderate in severity. She has just recently received her PAP device and is struggling with interface fit. We adjusted her headpiece straps today which led to a better fit and she was also seen by RT for mask adjustment.    PLAN  1. Continue PAP at 5-20 cm H2O, with an Airfit P10 mask, adjust as needed with DME company.  2. Return to clinic in 2 months, or sooner if needed.  3. Suggest discussing heart murmur with PCP.    The patient was seen, examined and the assessment and plan was formulated with Dr. Nicolasa Ducking.    Thank you for the opportunity to participate in this patient's care.    CC:  Eldred Manges, ARNP  No ref. provider found

## 2015-01-02 NOTE — Telephone Encounter (Signed)
Holly Blair has presented at the [facility] front desk at 3:55 to request a letter for public housing.  Letter pended.

## 2015-01-02 NOTE — Progress Notes (Signed)
SUBJECTIVE:   Holly Blair is a 49 year old female patient who presents with the following concern:     Chief Complaint: discuss f[facility] to [facility] and heart murmur    She is not sure when to return to [facility] for her next appointment.  She has met with the Education officer, museum, provider and nutritionist and is motivated to continue with the non-surgical plan.    She was at her appointment with the [facility] today and told that she had a heart murmur.  She would like me to listen to it.  She denies chest pain, palpitations, dizziness, and SOB.     Patient Active Problem List   Diagnosis   . Depression, major, recurrent ([place])   . Acne   . Severe obesity   . Chronic knee pain   . Chronic back pain   . Senile nuclear cataract   . Myopia with astigmatism and presbyopia   . Mental developmental delay   . Body mass index 50.0-59.9, adult ([place])   . Morbid obesity with BMI of 50.0-59.9, adult ([place])   . Hyperlipidemia   . Pre-bariatric surgery nutrition evaluation       Current Outpatient Prescriptions   Medication Sig Dispense Refill   . Acetaminophen 500 MG Oral Tab Take 1 tablet by mouth every 6 hours if needed to relieve pain 60 Tab 0   . Atorvastatin Calcium 10 MG Oral Tab Take 1 tablet (10 mg) by mouth daily. 90 tablet 3   . Clotrimazole 1 % External Cream Apply to affected area on abdomen 2 times a day. For fungal rash. 45 g 2   . FISH OIL  None Entered     . FLUoxetine HCl 20 MG Oral Tab Take 3 tablets (60 mg) by mouth daily. 90 tablet 3   . Ibuprofen 600 MG Oral Tab Take 1 tablet (600 mg) by mouth every 6 hours as needed for pain or inflammation. Take with food and plenty of water. 90 tablet 0   . Multiple Vitamin (MULTI VITAMIN DAILY OR) Take by mouth daily.     Marland Kitchen Nystatin 100000 UNIT/GM External Cream Apply to rash underneat breasts 2 times a day 30 g 5   . Nystatin 100000 UNIT/GM External Powder Apply underneath abdomen twice daily after Clotrimazole application 60 g 2   . TraZODone HCl 50 MG  Oral Tab Take 1 tablet by mouth at night at bedtime as needed 90 Tab 2     No current facility-administered medications for this visit.       Review of patient's allergies indicates:  No Known Allergies    OBJECTIVE:  General: Alert, no acute distress   BP 108/58 mmHg  Pulse 65  Ht 5' 4.75" (1.645 m)  Wt 331 lb 12.8 oz (150.503 kg)  BMI 55.62 kg/m2  LMP 12/17/2014  General appearance: healthy, alert, no distress  Mood/affect: Pleasant female. Mood and affect appropriate.  Respiratory: clear to auscultation  Heart: 1/6  low pitched pansystolic murmur, RRR    ASSESSMENT/PLAN:   Recommended e-messaging or call [facility] for next appointment because it is unclear to me what she should be scheduled for    Low grade pansystolic murmur, no further evaluation needed at this time

## 2015-01-02 NOTE — Addendum Note (Signed)
Addended by: Barbaraann Cao on: 01/02/2015 03:57 PM     Modules accepted: Level of Service

## 2015-01-02 NOTE — Patient Instructions (Signed)
Summary of today's visit:  1.  Great start with your CPAP! Please continue using your CPAP.  Wear it every time you sleep for at least 4 hours per night, ideally full the whole time that you are sleeping.     2.  Changes to CPAP settings: today we worked on getting the mask to fit better.     3. Please call [facility] if you have difficulty with the device.     Follow-up:    With Dr. Eulas Post in 2 months, sooner if needed.    Consider signing up for [email or web address] ([email or web address]).   It is an Systems analyst and community for patients, researchers and sleep providers to help advance research on sleep apnea.    Please bring your CPAP machine with you to every appointment. If you are unable to do this, please bring in the smart card.    If you have any questions or concerns, please contact the Call Center at [phone].

## 2015-01-03 DIAGNOSIS — R011 Cardiac murmur, unspecified: Secondary | ICD-10-CM | POA: Insufficient documentation

## 2015-01-13 ENCOUNTER — Telehealth (HOSPITAL_BASED_OUTPATIENT_CLINIC_OR_DEPARTMENT_OTHER): Payer: Self-pay | Admitting: Physician Assistant

## 2015-01-13 NOTE — Telephone Encounter (Signed)
CONFIRMED PHONE NUMBER: [phone]  CALLERS FIRST AND LAST NAME: Darnelle Maffucci,  FACILITY NAME: na TITLE: na  CALLERS RELATIONSHIP:Self  RETURN CALL: Detailed message on voicemail only     ([place]) Texting is an option for this clinic. If you would like [place] to use this option, which mobile phone number should we text to if we are unable to reach you?    SUBJECT: General Message   REASON FOR REQUEST: please contact patient she would like to know what her next step should be with clinic.    MESSAGE: please see above message.

## 2015-01-17 NOTE — Telephone Encounter (Signed)
Patient requested call back for clarification about next steps.    Left VM for patient and encouraged her to call this writer Wed-Friday at direct number below.    Romona Curls [place], LICSW  [phone]

## 2015-01-23 ENCOUNTER — Other Ambulatory Visit (HOSPITAL_BASED_OUTPATIENT_CLINIC_OR_DEPARTMENT_OTHER): Payer: Self-pay | Admitting: Naturopath

## 2015-01-23 DIAGNOSIS — D729 Disorder of white blood cells, unspecified: Secondary | ICD-10-CM

## 2015-02-21 ENCOUNTER — Telehealth (HOSPITAL_BASED_OUTPATIENT_CLINIC_OR_DEPARTMENT_OTHER): Payer: Self-pay | Admitting: Naturopath

## 2015-02-21 ENCOUNTER — Other Ambulatory Visit: Payer: Self-pay | Admitting: Naturopath

## 2015-02-21 DIAGNOSIS — R52 Pain, unspecified: Secondary | ICD-10-CM

## 2015-02-21 MED ORDER — IBUPROFEN 600 MG OR TABS
600.0000 mg | ORAL_TABLET | Freq: Four times a day (QID) | ORAL | Status: DC | PRN
Start: 2015-02-21 — End: 2015-04-24

## 2015-02-21 NOTE — Telephone Encounter (Signed)
There is another encounter open for the same request

## 2015-02-21 NOTE — Telephone Encounter (Signed)
LM at pt's identified vm that her Rx request for ibuprofen has been sent to [name] to authorize. Pls check w/ your [place] later today as to when to pick up.

## 2015-02-21 NOTE — Telephone Encounter (Signed)
The patient last received this medication at the requesting pharmacy on 09/05/2014 #90

## 2015-02-21 NOTE — Telephone Encounter (Signed)
Patient wants to know why her prescription for Ibuprofen 600 MG Oral Tab was denied to the pharmacy

## 2015-02-25 ENCOUNTER — Ambulatory Visit (INDEPENDENT_AMBULATORY_CARE_PROVIDER_SITE_OTHER): Payer: Self-pay | Admitting: Emergency Medicine

## 2015-02-25 VITALS — BP 156/90 | HR 92 | Temp 98.1°F | Resp 16 | Ht 63.0 in | Wt 183.6 lb

## 2015-02-25 DIAGNOSIS — T07XXXA Unspecified multiple injuries, initial encounter: Secondary | ICD-10-CM

## 2015-02-25 DIAGNOSIS — T148 Other injury of unspecified body region: Secondary | ICD-10-CM

## 2015-02-25 DIAGNOSIS — S0511XA Contusion of eyeball and orbital tissues, right eye, initial encounter: Secondary | ICD-10-CM

## 2015-02-25 DIAGNOSIS — Z23 Encounter for immunization: Secondary | ICD-10-CM

## 2015-02-25 NOTE — Patient Instructions (Addendum)
Eye Contusion An eye contusion is a deep bruise of the eye. This is often called a "black eye." Contusions are the result of an injury that caused bleeding under the skin. The contusion may turn blue, purple, or yellow. Minor injuries will give you a painless contusion, but more severe contusions may stay painful and swollen for a few weeks. If the eye contusion only involves the eyelids and tissues around the eye, the injured area will get better within a few days to weeks. However, eye contusions can be serious and affect the eyeball and sight. CAUSES   Blunt injury or trauma to the face or eye area.  A forehead injury that causes the blood under the skin to work its way down to the eyelids.  Rubbing the eyes due to irritation. SYMPTOMS   Swelling and redness around the eye.  Bruising around the eye.  Tenderness, soreness, or pain around the eye.  Blurry vision.  Tearing.  Eyeball redness. DIAGNOSIS  A diagnosis is usually based on a thorough exam of the eye and surrounding area. The eye must be looked at carefully to make sure it is not injured and to make sure nothing else will threaten your vision. A vision test may be done. An X-ray or computed tomography (CT) scan may be needed to determine if there are any associated injuries, such as broken bones (fractures). TREATMENT  If there is an injury to the eye, treatment will be determined by the nature of the injury. HOME CARE INSTRUCTIONS   Put ice on the injured area.  Put ice in a plastic bag.  Place a towel between your skin and the bag.  Leave the ice on for 15-20 minutes, 03-04 times a day.  If it is determined that there is no injury to the eye, you may continue normal activities.  Sunglasses may be worn to protect your eyes from bright light if light is uncomfortable.  Sleep with your head elevated. You can put an extra pillow under your head. This may help with discomfort.  Only take over-the-counter or  prescription medicines for pain, discomfort, or fever as directed by your caregiver. Do not take aspirin for the first few days. This may increase bruising. SEEK IMMEDIATE MEDICAL CARE IF:   You have any form of vision loss.  You have double vision.  You feel nauseous.  You feel dizzy, sleepy, or like you will faint.  You have any fluid discharge from the eye or your nose.  You have swelling and discoloration that does not fade. MAKE SURE YOU:   Understand these instructions.  Will watch your condition.  Will get help right away if you are not doing well or get worse. Document Released: 06/04/2000 Document Revised: 08/30/2011 Document Reviewed: 04/23/2011 [facility] Patient Information 2015 [place], [place]. This information is not intended to replace advice given to you by your health care provider. Make sure you discuss any questions you have with your health care provider.   Tdap Vaccine (Tetanus, Diphtheria, Pertussis): What You Need to Know 1. Why get vaccinated? Tetanus, diphtheria and pertussis can be very serious diseases, even for adolescents and adults. Tdap vaccine can protect [place] from these diseases. TETANUS (Lockjaw) causes painful muscle tightening and stiffness, usually all over the body.  It can lead to tightening of muscles in the head and neck so you can't open your mouth, swallow, or sometimes even breathe. Tetanus kills about 1 out of 5 people who are infected. DIPHTHERIA can cause a thick  coating to form in the back of the throat.  It can lead to breathing problems, paralysis, heart failure, and death. PERTUSSIS (Whooping Cough) causes severe coughing spells, which can cause difficulty breathing, vomiting and disturbed sleep.  It can also lead to weight loss, incontinence, and rib fractures. Up to 2 in 100 adolescents and 5 in 100 adults with pertussis are hospitalized or have complications, which could include pneumonia or death. These diseases are caused by  bacteria. Diphtheria and pertussis are spread from person to person through coughing or sneezing. Tetanus enters the body through cuts, scratches, or wounds. Before vaccines, the [place] saw as many as 200,000 cases a year of diphtheria and pertussis, and hundreds of cases of tetanus. Since vaccination began, tetanus and diphtheria have dropped by about 99% and pertussis by about 80%. 2. Tdap vaccine Tdap vaccine can protect adolescents and adults from tetanus, diphtheria, and pertussis. One dose of Tdap is routinely given at age 89 or 50. People who did not get Tdap at that age should get it as soon as possible. Tdap is especially important for health care professionals and anyone having close contact with a baby younger than 12 months. Pregnant women should get a dose of Tdap during every pregnancy, to protect the newborn from pertussis. Infants are most at risk for severe, life-threatening complications from pertussis. A similar vaccine, called Td, protects from tetanus and diphtheria, but not pertussis. A Td booster should be given every 10 years. Tdap may be given as one of these boosters if you have not already gotten a dose. Tdap may also be given after a severe cut or burn to prevent tetanus infection. Your doctor can give you more information. Tdap may safely be given at the same time as other vaccines. 3. Some people should not get this vaccine  If you ever had a life-threatening allergic reaction after a dose of any tetanus, diphtheria, or pertussis containing vaccine, OR if you have a severe allergy to any part of this vaccine, you should not get Tdap. Tell your doctor if you have any severe allergies.  If you had a coma, or long or multiple seizures within 7 days after a childhood dose of DTP or DTaP, you should not get Tdap, unless a cause other than the vaccine was found. You can still get Td.  Talk to your doctor if you:  have epilepsy or another nervous system problem,  had  severe pain or swelling after any vaccine containing diphtheria, tetanus or pertussis,  ever had Guillain-Barr Syndrome (GBS),  aren't feeling well on the day the shot is scheduled. 4. Risks of a vaccine reaction With any medicine, including vaccines, there is a chance of side effects. These are usually mild and go away on their own, but serious reactions are also possible. Brief fainting spells can follow a vaccination, leading to injuries from falling. Sitting or lying down for about 15 minutes can help prevent these. Tell your doctor if you feel dizzy or light-headed, or have vision changes or ringing in the ears. Mild problems following Tdap (Did not interfere with activities)  Pain where the shot was given (about 3 in 4 adolescents or 2 in 3 adults)  Redness or swelling where the shot was given (about 1 person in 5)  Mild fever of at least 100.33F (up to about 1 in 25 adolescents or 1 in 100 adults)  Headache (about 3 or 4 people in 10)  Tiredness (about 1 person in 3 or  4)  Nausea, vomiting, diarrhea, stomach ache (up to 1 in 4 adolescents or 1 in 10 adults)  Chills, body aches, sore joints, rash, swollen glands (uncommon) Moderate problems following Tdap (Interfered with activities, but did not require medical attention)  Pain where the shot was given (about 1 in 5 adolescents or 1 in 100 adults)  Redness or swelling where the shot was given (up to about 1 in 16 adolescents or 1 in 25 adults)  Fever over 102F (about 1 in 100 adolescents or 1 in 250 adults)  Headache (about 3 in 20 adolescents or 1 in 10 adults)  Nausea, vomiting, diarrhea, stomach ache (up to 1 or 3 people in 100)  Swelling of the entire arm where the shot was given (up to about 3 in 100). Severe problems following Tdap (Unable to perform usual activities; required medical attention)  Swelling, severe pain, bleeding and redness in the arm where the shot was given (rare). A severe allergic reaction  could occur after any vaccine (estimated less than 1 in a million doses). 5. What if there is a serious reaction? What should I look for?  Look for anything that concerns you, such as signs of a severe allergic reaction, very high fever, or behavior changes. Signs of a severe allergic reaction can include hives, swelling of the face and throat, difficulty breathing, a fast heartbeat, dizziness, and weakness. These would start a few minutes to a few hours after the vaccination. What should I do?  If you think it is a severe allergic reaction or other emergency that can't wait, call 9-1-1 or get the person to the nearest hospital. Otherwise, call your doctor.  Afterward, the reaction should be reported to the "Vaccine Adverse Event Reporting System" (VAERS). Your doctor might file this report, or you can do it yourself through the VAERS web site at www.vaers.SamedayNews.es, or by calling 704-433-8377. VAERS is only for reporting reactions. They do not give medical advice.  6. The National Vaccine Injury Compensation Program The [employer] Vaccine Injury Compensation Program (VICP) is a federal program that was created to compensate people who may have been injured by certain vaccines. Persons who believe they may have been injured by a vaccine can learn about the program and about filing a claim by calling 510-592-7650 or visiting the [place] website at GoldCloset.com.ee. 7. How can I learn more?  Ask your doctor.  Call your local or state health department.  Contact the Centers for Disease Control and Prevention (CDC):  Call 774-027-6784 or visit CDC's website at http://hunter.com/. CDC Tdap Vaccine VIS (10/28/11) Document Released: 12/07/2011 Document Revised: 10/22/2013 Document Reviewed: 09/19/2013 [employer] Patient Information 2015 [place], [place]. This information is not intended to replace advice given to you by your health care provider. Make sure you discuss any questions  you have with your health care provider.

## 2015-02-25 NOTE — Progress Notes (Signed)
Subjective:  Patient ID: Charlene Johnson, female    DOB: Aug 20, 1965  Age: 49 y.o. MRN: [id number]  CC: Eye Pain   HPI Charlene Johnson presents  with a black eye. She apparently was trying to separate 2 dogs were fighting and got between the most not down. She did a face plant on the driveway. Has a black right eye. She was concerned because the swelling in her upper lid was almost immediate. She has no history of loss of consciousness no nausea or vomiting. No double vision blurred vision or difficulty gait balance or coordination. She denies any other complaint except for some multiple abrasions.  History Charlene Johnson has no past medical history on file.   She has past surgical history that includes Eye surgery.   Her  family history includes Cancer in her maternal grandfather, maternal grandmother, and paternal grandfather; Heart disease in her father; Hyperlipidemia in her father; Stroke in her paternal grandmother.  She   reports that she has never smoked. She does not have any smokeless tobacco history on file. She reports that she drinks alcohol. She reports that she does not use illicit drugs.  No outpatient prescriptions prior to visit.   No facility-administered medications prior to visit.    Social History   Social History  . Marital Status: Single    Spouse Name: N/A  . Number of Children: N/A  . Years of Education: N/A   Social History Main Topics  . Smoking status: Never Smoker   . Smokeless tobacco: None  . Alcohol Use: 0.0 oz/week    0 Standard drinks or equivalent per week  . Drug Use: No  . Sexual Activity: Not Asked   Other Topics Concern  . None   Social History Narrative  . None     Review of Systems  Constitutional: Negative for fever, chills and appetite change.  HENT: Negative for congestion, ear pain, postnasal drip, sinus pressure and sore throat.   Eyes: Negative for pain and redness.  Respiratory: Negative for cough, shortness of breath and  wheezing.   Cardiovascular: Negative for leg swelling.  Gastrointestinal: Negative for nausea, vomiting, abdominal pain, diarrhea, constipation and blood in stool.  Endocrine: Negative for polyuria.  Genitourinary: Negative for dysuria, urgency, frequency and flank pain.  Musculoskeletal: Negative for gait problem.  Skin: Negative for rash.  Neurological: Negative for weakness and headaches.  Psychiatric/Behavioral: Negative for confusion and decreased concentration. The patient is not nervous/anxious.     Objective:  BP 156/90 mmHg  Pulse 92  Temp(Src) 98.1 F (36.7 C) (Oral)  Resp 16  Ht 5\' 3"  (1.6 m)  Wt 183 lb 9.6 oz (83.28 kg)  BMI 32.53 kg/m2  SpO2 98%  LMP 02/11/2015  Physical Exam  Constitutional: She is oriented to person, place, and time. She appears well-developed and well-nourished. No distress.  HENT:  Head: Normocephalic and atraumatic.  Right Ear: External ear normal.  Left Ear: External ear normal.  Nose: Nose normal.  Eyes: Conjunctivae and EOM are normal. Pupils are equal, round, and reactive to light. No scleral icterus.  Neck: Normal range of motion. Neck supple. No tracheal deviation present.  Cardiovascular: Normal rate, regular rhythm and normal heart sounds.   Pulmonary/Chest: Effort normal. No respiratory distress. She has no wheezes. She has no rales.  Abdominal: She exhibits no mass. There is no tenderness. There is no rebound and no guarding.  Musculoskeletal: She exhibits no edema.  Lymphadenopathy:    She has no cervical adenopathy.  Neurological: She is alert and oriented to person, place, and time. Coordination normal.  Skin: Skin is warm and dry. No rash noted.  Psychiatric: She has a normal mood and affect. Her behavior is normal.   she has a moderate to supraorbital ecchymosis. Involving her upper lid. She has no hyphema. No conjunctival hemorrhage.    Assessment & Plan:   Charlene Johnson was seen today for eye pain.  Diagnoses and all orders  for this visit:  Periorbital contusion of right eye, initial encounter  Abrasions of multiple sites  Other orders -     Tdap vaccine greater than or equal to 7yo IM   Charlene Johnson does not currently have medications on file.  No orders of the defined types were placed in this encounter.    Appropriate red flag conditions were discussed with the patient as well as actions that should be taken.  Patient expressed his understanding.  Follow-up: Return if symptoms worsen or fail to improve.  Roselee Culver, MD

## 2015-03-06 ENCOUNTER — Encounter (HOSPITAL_BASED_OUTPATIENT_CLINIC_OR_DEPARTMENT_OTHER): Payer: Medicare HMO

## 2015-03-20 ENCOUNTER — Encounter (HOSPITAL_BASED_OUTPATIENT_CLINIC_OR_DEPARTMENT_OTHER): Payer: Medicare HMO

## 2015-03-20 ENCOUNTER — Telehealth (HOSPITAL_BASED_OUTPATIENT_CLINIC_OR_DEPARTMENT_OTHER): Payer: Self-pay | Admitting: Internal Medicine

## 2015-03-20 NOTE — Telephone Encounter (Signed)
(  TEXTING IS AN OPTION FOR [facility] ONLY)  Is this a [place] clinic? No      RETURN CALL: No call back needed      SUBJECT:  Cancellation/Reschedule Notification     ORIGINAL APPOINTMENT DATE: 03/20/2015, TIME: 11:00am  REASON: patient has scheduling conflict  RESCHEDULED: YES, Date 03/27/2015, Time 10:00am

## 2015-03-27 ENCOUNTER — Encounter (HOSPITAL_BASED_OUTPATIENT_CLINIC_OR_DEPARTMENT_OTHER): Payer: Medicare HMO

## 2015-03-27 NOTE — Telephone Encounter (Signed)
Patient had an appointment today 10/06 and had to cancel due to conflicting appointments. [facility] tried to reschedule with Dr.Carter but had no access to template. Please Advise. Thank you

## 2015-04-01 ENCOUNTER — Other Ambulatory Visit: Payer: Self-pay

## 2015-04-02 ENCOUNTER — Other Ambulatory Visit (HOSPITAL_BASED_OUTPATIENT_CLINIC_OR_DEPARTMENT_OTHER): Payer: Self-pay | Admitting: Naturopath

## 2015-04-02 DIAGNOSIS — G4700 Insomnia, unspecified: Secondary | ICD-10-CM

## 2015-04-02 MED ORDER — TRAZODONE HCL 50 MG OR TABS
50.0000 mg | ORAL_TABLET | Freq: Every evening | ORAL | Status: DC | PRN
Start: 2015-04-02 — End: 2015-07-15

## 2015-04-05 ENCOUNTER — Other Ambulatory Visit: Payer: Self-pay

## 2015-04-23 ENCOUNTER — Ambulatory Visit (HOSPITAL_BASED_OUTPATIENT_CLINIC_OR_DEPARTMENT_OTHER): Payer: Medicare HMO | Admitting: Naturopath

## 2015-04-24 ENCOUNTER — Other Ambulatory Visit (HOSPITAL_BASED_OUTPATIENT_CLINIC_OR_DEPARTMENT_OTHER): Payer: Self-pay | Admitting: Naturopath

## 2015-04-24 ENCOUNTER — Other Ambulatory Visit (HOSPITAL_BASED_OUTPATIENT_CLINIC_OR_DEPARTMENT_OTHER): Payer: Self-pay | Admitting: Registered Nurse

## 2015-04-24 ENCOUNTER — Ambulatory Visit (HOSPITAL_BASED_OUTPATIENT_CLINIC_OR_DEPARTMENT_OTHER): Payer: Medicare HMO | Admitting: Naturopath

## 2015-04-24 ENCOUNTER — Encounter: Payer: Self-pay | Admitting: Naturopath

## 2015-04-24 DIAGNOSIS — R52 Pain, unspecified: Secondary | ICD-10-CM

## 2015-04-24 DIAGNOSIS — M25569 Pain in unspecified knee: Secondary | ICD-10-CM

## 2015-04-24 DIAGNOSIS — F419 Anxiety disorder, unspecified: Secondary | ICD-10-CM

## 2015-04-24 DIAGNOSIS — G8929 Other chronic pain: Secondary | ICD-10-CM

## 2015-04-24 MED ORDER — FLUOXETINE HCL 20 MG OR TABS
60.0000 mg | ORAL_TABLET | Freq: Every day | ORAL | Status: DC
Start: 2015-04-24 — End: 2015-05-23

## 2015-04-24 MED ORDER — IBUPROFEN 600 MG OR TABS
600.0000 mg | ORAL_TABLET | Freq: Four times a day (QID) | ORAL | Status: DC | PRN
Start: 2015-04-24 — End: 2015-08-22

## 2015-04-24 NOTE — Telephone Encounter (Signed)
This request falls outside refill center's protocols. Acetaminophen was last prescribed in 2012, ok to provide refills?  If this medication is denied please have your staff inform the patient and schedule an appointment if necessary.

## 2015-04-24 NOTE — Telephone Encounter (Signed)
The patient last received this medication at the requesting pharmacy on               02/14/15

## 2015-04-24 NOTE — Telephone Encounter (Signed)
Patient already has other encounter open for the same request

## 2015-04-25 ENCOUNTER — Ambulatory Visit (HOSPITAL_BASED_OUTPATIENT_CLINIC_OR_DEPARTMENT_OTHER): Payer: Medicare HMO | Admitting: Naturopath

## 2015-05-01 MED ORDER — ACETAMINOPHEN 500 MG OR TABS
500.0000 mg | ORAL_TABLET | Freq: Four times a day (QID) | ORAL | Status: DC | PRN
Start: 2015-05-01 — End: 2016-08-30

## 2015-05-01 NOTE — Telephone Encounter (Signed)
This encounter is still open from 04/24/15, please approve or deny this request.

## 2015-05-23 ENCOUNTER — Other Ambulatory Visit: Payer: Self-pay | Admitting: Naturopath

## 2015-05-23 DIAGNOSIS — F419 Anxiety disorder, unspecified: Secondary | ICD-10-CM

## 2015-05-23 MED ORDER — FLUOXETINE HCL 20 MG OR TABS
60.0000 mg | ORAL_TABLET | Freq: Every day | ORAL | Status: DC
Start: 2015-05-23 — End: 2015-07-14

## 2015-05-23 NOTE — Telephone Encounter (Signed)
The patient last received this at the requesting pharmacy on  04/24/15

## 2015-06-05 ENCOUNTER — Ambulatory Visit (HOSPITAL_BASED_OUTPATIENT_CLINIC_OR_DEPARTMENT_OTHER): Payer: Medicare HMO | Admitting: Naturopath

## 2015-06-20 ENCOUNTER — Ambulatory Visit (HOSPITAL_BASED_OUTPATIENT_CLINIC_OR_DEPARTMENT_OTHER): Payer: Medicare HMO | Admitting: Naturopath

## 2015-07-02 ENCOUNTER — Other Ambulatory Visit: Payer: Self-pay | Admitting: Naturopath

## 2015-07-02 DIAGNOSIS — F419 Anxiety disorder, unspecified: Secondary | ICD-10-CM

## 2015-07-02 MED ORDER — FLUOXETINE HCL 20 MG OR CAPS
60.0000 mg | ORAL_CAPSULE | Freq: Every day | ORAL | Status: DC
Start: 2015-07-02 — End: 2015-07-15

## 2015-07-02 NOTE — Telephone Encounter (Signed)
Patient has cancelled 5 appointments in the last 2 months, overdue for annual exam    Defer to PCP- patient requesting for 90 days supply of fluoxetine, [facility] can only authorized enough until her appointment on 07/15/2015

## 2015-07-02 NOTE — Telephone Encounter (Signed)
The patient last received this at the requesting pharmacy on  05/23/15.  Filled as capsules-it is tablets on the med list.

## 2015-07-14 ENCOUNTER — Other Ambulatory Visit: Payer: Self-pay | Admitting: Naturopath

## 2015-07-14 DIAGNOSIS — B372 Candidiasis of skin and nail: Secondary | ICD-10-CM

## 2015-07-14 NOTE — Telephone Encounter (Signed)
Patient has an appointment with you tomorrow .  Please address patient's refill requests at the appt for the following medication(s): Clotrimazole cream      ---    Nystatin cream - I accidentally d/c'd --> added historically

## 2015-07-15 ENCOUNTER — Encounter (HOSPITAL_BASED_OUTPATIENT_CLINIC_OR_DEPARTMENT_OTHER): Payer: Self-pay | Admitting: Naturopath

## 2015-07-15 ENCOUNTER — Ambulatory Visit: Payer: Medicare HMO | Attending: Naturopath | Admitting: Naturopath

## 2015-07-15 ENCOUNTER — Ambulatory Visit (HOSPITAL_BASED_OUTPATIENT_CLINIC_OR_DEPARTMENT_OTHER): Payer: Medicare HMO

## 2015-07-15 ENCOUNTER — Other Ambulatory Visit: Payer: Self-pay | Admitting: Naturopath

## 2015-07-15 VITALS — BP 129/76 | HR 62 | Wt 321.2 lb

## 2015-07-15 DIAGNOSIS — F419 Anxiety disorder, unspecified: Secondary | ICD-10-CM

## 2015-07-15 DIAGNOSIS — Z1231 Encounter for screening mammogram for malignant neoplasm of breast: Secondary | ICD-10-CM | POA: Insufficient documentation

## 2015-07-15 DIAGNOSIS — E7800 Pure hypercholesterolemia, unspecified: Secondary | ICD-10-CM

## 2015-07-15 DIAGNOSIS — D729 Disorder of white blood cells, unspecified: Secondary | ICD-10-CM | POA: Insufficient documentation

## 2015-07-15 DIAGNOSIS — Z5181 Encounter for therapeutic drug level monitoring: Secondary | ICD-10-CM | POA: Insufficient documentation

## 2015-07-15 DIAGNOSIS — Z0000 Encounter for general adult medical examination without abnormal findings: Secondary | ICD-10-CM | POA: Insufficient documentation

## 2015-07-15 DIAGNOSIS — B372 Candidiasis of skin and nail: Secondary | ICD-10-CM

## 2015-07-15 DIAGNOSIS — G4700 Insomnia, unspecified: Secondary | ICD-10-CM | POA: Insufficient documentation

## 2015-07-15 MED ORDER — CLOTRIMAZOLE 1 % EX CREA
TOPICAL_CREAM | Freq: Two times a day (BID) | CUTANEOUS | Status: DC
Start: 2015-07-15 — End: 2018-07-28

## 2015-07-15 MED ORDER — FLUOXETINE HCL 20 MG OR CAPS
60.0000 mg | ORAL_CAPSULE | Freq: Every day | ORAL | Status: DC
Start: 2015-07-15 — End: 2015-12-12

## 2015-07-15 MED ORDER — ATORVASTATIN CALCIUM 10 MG OR TABS
10.0000 mg | ORAL_TABLET | Freq: Every day | ORAL | Status: DC
Start: 2015-07-15 — End: 2016-06-24

## 2015-07-15 MED ORDER — TRAZODONE HCL 50 MG OR TABS
50.0000 mg | ORAL_TABLET | Freq: Every evening | ORAL | Status: DC | PRN
Start: 2015-07-15 — End: 2016-08-27

## 2015-07-15 MED ORDER — NYSTATIN 100000 UNIT/GM EX POWD
CUTANEOUS | Status: DC
Start: 2015-07-15 — End: 2016-11-10

## 2015-07-15 NOTE — Progress Notes (Signed)
SUBJECTIVE:    Holly Blair is a 50 year old female here today for a preventive health visit.     Other problems or concerns today:   She needs medication refills including Fluoxetine, Atorvastatin, Trazodone, Clotrimazole and Nystatin..  Moods are stable.  She is living with a friend and continues to work for [employer].  No health concerns today.  She had her mammogram prior to this appointment.     Health Maintenance   Topic Date Due   . Pap Smear  06/27/2014   . Diabetes Screen  12/09/2017   . Cholesterol Test  11/08/2018   . Tetanus Vaccine  06/11/2024   . Influenza Vaccine  Addressed   . HIV Screen  Addressed       Patient Active Problem List   Diagnosis   . Depression, major, recurrent ([place])   . Acne   . Severe obesity   . Chronic knee pain   . Chronic back pain   . Senile nuclear cataract   . Myopia with astigmatism and presbyopia   . Mental developmental delay   . Body mass index 50.0-59.9, adult ([place])   . Morbid obesity with BMI of 50.0-59.9, adult ([place])   . Hyperlipidemia   . Pre-bariatric surgery nutrition evaluation   . Systolic murmur       Past Medical History   Diagnosis Date   . Depression, major, recurrent ([place])    . Acne    . Obesity    . Chronic knee pain    . Chronic back pain    . Anxiety state, unspecified    . Cataract    . Arthritis    . Heart murmur        Past Surgical History   Procedure Laterality Date   . Lig/trnsxj flp tube abdl/vag appr uni/bi         Current Outpatient Prescriptions   Medication Sig Dispense Refill   . Acetaminophen 500 MG Oral Tab Take 1 tablet (500 mg) by mouth every 6 hours as needed for pain. 60 tablet 0   . Atorvastatin Calcium 10 MG Oral Tab Take 1 tablet (10 mg) by mouth daily. 90 tablet 3   . Clotrimazole 1 % External Cream Apply to affected area on abdomen 2 times a day. For fungal rash. 45 g 2   . FISH OIL  None Entered     . FLUoxetine HCl 20 MG Oral Cap Take 3 capsules (60 mg) by mouth daily. 270 capsule 0   . Ibuprofen 600 MG Oral Tab Take 1 tablet  (600 mg) by mouth every 6 hours as needed for pain. Take with food and plenty of water. Please schedule follow up appt 90 tablet 0   . Multiple Vitamin (MULTI VITAMIN DAILY OR) Take by mouth daily.     Marland Kitchen Nystatin 100000 UNIT/GM External Cream Apply to rash underneat breasts 2 times a day 30 g 5   . Nystatin 100000 UNIT/GM External Powder Apply underneath abdomen twice daily after Clotrimazole application 60 g 2   . TraZODone HCl 50 MG Oral Tab Take 1 tablet (50 mg) by mouth at bedtime as needed for sleep. 90 tablet 0     No current facility-administered medications for this visit.       Review of patient's allergies indicates:  No Known Allergies      SOCIAL HISTORY  Holly Blair works for [employer].  She is living with a friend and not currently in  a relationship.  She denies any new or particulular stress.      FAMILY HISTORY  Family History   Problem Relation Age of Onset   . Hypertension Maternal Grandfather    . Cancer Mother      lung cancer, long history of smkoing       GYN HISTORY  Obstetric History    G0   P0   T0   P0   A0   TAB0   SAB0   E0   M0   L0      LMP: 06/16/2015   Menses: menses regular every 28-32 days  Last pap: 2013  Pap history: no history of abnormal paps  Other gyn history: none    SEXUAL HISTORY  Sexual activity: not at present  History of STD: none known  Sexual concerns: No    NUTRITION  Current dietary habits: smoothie, salads and chicken    EXERCISE  Current exercise habits: plans to join a gym and start water aerobics     HABITS  Smoker: No  Alcohol: No  Recreational Drug Use: NO    SAFETY  History of sexual or physical abuse: No  Safe in current living space: YES  Regular seat belt use: YES  Stress: No    ROS:  The remaining review of systems was reviewed and is negative.    OBJECTIVE:  BP 129/76 mmHg  Pulse 62  Wt 321 lb 3.2 oz (145.695 kg)  LMP 06/16/2015  Body mass index is 53.84 kg/(m^2).  General appearance: healthy, alert, no distress  Mood/affect: Pleasant female.  Mood and affect appropriate.  Skin: Skin color, texture, turgor normal. Skin under abdominal adipose with erythema and intermittent satellite lesions   HEENT:Ear:  External ears normal. Canals clear. TM's normal. and Throat:  oropharynx w/o lesions, erythema, exudate; MMM and no tonsillar enlargement  Neck: supple. No adenopathy. Thyroid symmetric, normal size, without nodules  Respiratory: Respirations even and unlabored. Clear lung fields without crackles, wheezing or expiratory prolongation  Cardiovascular: S1 and S2 present. RRR without murmurs.  Breasts: No obvious deformity or mass to inspection, nipples everted bilaterally, no skin lesion or nipple discharge, no mass palpated, no axillary lymphadenopathy  Pelvic: deferred       ASSESSMENT/PLAN:    Annual Preventative Health Exam.  Health Care Maintenance:  Screening Labs: Lipids, CMP, CBC  Pap smear due 2018  STD Check:  Not indicated  Mammogram up to date   Additional Recommendations: discussed importance of exercise for weight loss   Preventive counseling: health maintenance  immunizations  diet rich in 3 omega fatty acids and low in saturated fats  low-fat high-fiber diet  vitamin D supplementation  proper exercise  Follow-up: 1 year    Anxiety  -     FLUoxetine HCl 20 MG Oral Cap; Take 3 capsules (60 mg) by mouth daily.    Hypercholesterolemia  -     Atorvastatin Calcium 10 MG Oral Tab; Take 1 tablet (10 mg) by mouth daily.  -     LIPID PANEL    Candidiasis of skin and nails  -     Clotrimazole 1 % External Cream; Apply to affected area on abdomen 2 times a day. For fungal rash.  -     Nystatin 100000 UNIT/GM External Powder; Apply underneath abdomen twice daily after Clotrimazole application    Insomnia, unspecified type  -     TraZODone HCl 50 MG Oral Tab; Take 1 tablet (50 mg) by mouth  at bedtime as needed for sleep.

## 2015-07-15 NOTE — Patient Instructions (Signed)
We live in [place] and don't see the sunshine nearly enough. I recommend Vitamin D supplementation for all my patients (especially October-June) for prevention of heart disease, osteoporosis, auto immune disease, diabetes, and for mood stability.   I generally recommend 2000 IU Vitamin D3 daily.     Leading a Healthy Life- Six tips to help improve your health and wellness   Eat well to give your body the energy it needs.   Stay or get active.   A healthy mind is part of a healthy body.   Practice safe living habits.   Keep your mind and body clean.   Get regular health care.     Tip #1: Eat well to give your body the energy it needs   Your body needs nutritious foods to stay strong and healthy. Here are some general eating guidelines:   . Eat real food (not processed, packaged, and already made)  . Eat adequate vegetables and fruits (6 servings daily). These foods contain invaluable micronutrients, phytochemicals, antioxidants and fiber.   . Choose whole grains over simple carbs. (brown rice over white rice)  . Eat more fiber (legumes, fresh fruit, ground flax seeds) Minimum daily fiber intake should be 30 grams!!!  . Eat healthy fats (olive oil, nuts, avocados, essential fatty acids) and avoid trans fats and high saturated fats.  . Eat adequate protein (0.5 g/lb body weight daily) from lean sources including: poultry, eggs, legumes, tofu, dairy products, nuts and seeds.   . Eat calcium rich foods (milk, cheese, yogurt, kale legumes, tofu, figs, sardines) 2-3 times daily.  Calcium is best absorbed through food.    . Eliminate the intake of refined sugar and refined carbohydrates.   . Eat healthy snacks with protein source.  . Keep salt intake low. Less than 1500 mg daily.  . Drink adequate water-generally 64 ounces daily.  . Avoid sugary drinks (juice and soda)  . Minimal caffeine intake: no more than one caffeinated drink daily is recommended.    Tip #2: Stay or get active   Exercise for at least 30 minutes at a  time, at least 4 times a week. Regular physical activity can help you:   . Live longer and feel better   . Be stronger and more flexible  . Build strong bones  . Prevent depression and anxiety  . Manage stress  . Strengthen your immune system  . Maintain a healthy body weight  . Improve brain function    Tip #3: Remember: A healthy mind is part of a healthy body   A good state of mind can help you make healthy choices. Here are a few tips for keeping your mind healthy:   . Reduce stress in your life!  Stress is one of the main causes of illness.  . Make some time every day for things that are fun and bring you joy.  . Move!  Exercise is a great way to reduce stress.  . Get enough sleep.  Lack of sleep reduces how well you can concentrate, it increases mood swings, and increases stress hormone production in the body.  It has a ripple effect and pretty much effects everything!   . Ask your health care provider for help if you feel depressed or anxious for more than several days in a row.     Tip #4: Practice safe living habits   Accidents and Injuries   Accidents and injuries are the 5th leading cause of death in the U.S.     Women under age 35 are more likely to die in motor vehicle accidents than from any other cause.   Accidents in the home cause thousands of permanent injuries every year. The most common accidents are fires, falls, and drowning. To help yourself and your family stay safe:   . Install smoke detectors on each floor of your home and know how to use them.  . Stay safe on the road: wear a seatbelt, do not ride with someone who has been drinking or taking drugs, avoid cell phone use while driving, wear a helmet when riding a bicycle or motorcycle  Hand Hygiene   Protect yourself from germs by washing your hands often!     Tip #5: Keep your mind and body clean   . Smoking is the single worst thing you can do for your health.  It doubles your risk for developing both heart disease and cancer.  . Minimal  alcohol intake: no more than one drink a night is recommended.    Tip #6: Get regular health care   Many people think they need to see their provider only when they are sick. But, health care providers can also help you stay healthy.   . Find a health care provider who works with you to manage your health.  This is a team game.  . Ask your health care provider if you have questions.  There is no such thing as a stupid question-only a question not asked.  . Don't save your concerns for your annual check-up.  Come in as they occur.  . Smile more-it makes you feel good!

## 2015-08-22 ENCOUNTER — Other Ambulatory Visit: Payer: Self-pay | Admitting: Naturopath

## 2015-08-22 DIAGNOSIS — R52 Pain, unspecified: Secondary | ICD-10-CM

## 2015-08-22 NOTE — Telephone Encounter (Signed)
The patient last received this at the requesting pharmacy on  04/24/15

## 2015-08-25 MED ORDER — IBUPROFEN 600 MG OR TABS
600.0000 mg | ORAL_TABLET | Freq: Four times a day (QID) | ORAL | Status: DC | PRN
Start: 2015-08-25 — End: 2015-12-12

## 2015-09-10 ENCOUNTER — Ambulatory Visit: Payer: Self-pay | Admitting: Physician Assistant

## 2015-09-10 ENCOUNTER — Encounter: Payer: Self-pay | Admitting: Physician Assistant

## 2015-09-10 VITALS — BP 110/79 | HR 92 | Temp 98.3°F

## 2015-09-10 DIAGNOSIS — J069 Acute upper respiratory infection, unspecified: Secondary | ICD-10-CM

## 2015-09-10 DIAGNOSIS — R52 Pain, unspecified: Secondary | ICD-10-CM

## 2015-09-10 LAB — POCT INFLUENZA A/B
INFLUENZA B, POC: NEGATIVE
Influenza A, POC: NEGATIVE

## 2015-09-10 MED ORDER — FLUCONAZOLE 150 MG PO TABS
150.0000 mg | ORAL_TABLET | Freq: Once | ORAL | Status: DC
Start: 2015-09-10 — End: 2016-05-06

## 2015-09-10 MED ORDER — AMOXICILLIN 875 MG PO TABS: 875.0000 mg | ORAL_TABLET | Freq: Two times a day (BID) | ORAL | Status: DC

## 2015-09-10 NOTE — Progress Notes (Signed)
S: C/o runny nose and congestion with dry cough for 3 days, + fever, chills, that started today;  denies cp/sob, v/d; mucus was green this am but clear throughout the day, cough is sporadic, recently flew from [place] and sat next to someone who had the flu  Using otc meds: robitussin  O: PE: vitals wnl, nad,  perrl eomi, normocephalic, tms dull, nasal mucosa red and swollen, throat injected, neck supple no lymph, lungs c t a, cv rrr, neuro intact, flu swab neg  A:  Acute uri;  P: amoxil 875mg  bid, diflucan; drink fluids, continue regular meds , use otc meds of choice, return if not improving in 5 days, return earlier if worsening

## 2015-09-30 ENCOUNTER — Telehealth (HOSPITAL_BASED_OUTPATIENT_CLINIC_OR_DEPARTMENT_OTHER): Payer: Self-pay | Admitting: Ophthalmology

## 2015-09-30 NOTE — Telephone Encounter (Signed)
(  TEXTING IS AN OPTION FOR [facility] CL[facility] ONLY)  Is this a [place] clinic? No      RETURN CALL: Detailed message on voicemail only      SUBJECT:  General Message     REASON FOR REQUEST: Medical Recommendation    MESSAGE: Spoke with Savaya wanted to know when to have next eye appointment, please contact and advise when available. Thank you

## 2015-11-19 ENCOUNTER — Telehealth (HOSPITAL_BASED_OUTPATIENT_CLINIC_OR_DEPARTMENT_OTHER): Payer: Self-pay | Admitting: Naturopath

## 2015-11-19 NOTE — Telephone Encounter (Signed)
Message left on voice mail to return my call.  Letter pended

## 2015-11-19 NOTE — Telephone Encounter (Signed)
Pt is calling to request letter from her provider for missed work due to illness. Please fax to [place] @ [phone]     Pt called in sick from work on Saturday, May 27th & Monday, May 29th. Please make sure these dates are include in the letter.     Pt called and spoke w/ [employer] RN regarding her symptoms.

## 2015-11-20 NOTE — Telephone Encounter (Signed)
RN faxed Excuse Letter to [facility] at [phone].    RN lmtcb on pt's VM for return call back to notify her of this

## 2015-11-20 NOTE — Telephone Encounter (Signed)
letter signed and ready to be faxed

## 2015-11-27 ENCOUNTER — Encounter (HOSPITAL_BASED_OUTPATIENT_CLINIC_OR_DEPARTMENT_OTHER): Payer: Medicare HMO | Admitting: Naturopath

## 2015-12-05 ENCOUNTER — Encounter (HOSPITAL_BASED_OUTPATIENT_CLINIC_OR_DEPARTMENT_OTHER): Payer: Medicare HMO | Admitting: Naturopath

## 2015-12-11 ENCOUNTER — Encounter (HOSPITAL_BASED_OUTPATIENT_CLINIC_OR_DEPARTMENT_OTHER): Payer: Medicare HMO | Admitting: Naturopath

## 2015-12-12 ENCOUNTER — Encounter (HOSPITAL_BASED_OUTPATIENT_CLINIC_OR_DEPARTMENT_OTHER): Payer: Medicare HMO | Admitting: Naturopath

## 2015-12-12 ENCOUNTER — Other Ambulatory Visit (HOSPITAL_BASED_OUTPATIENT_CLINIC_OR_DEPARTMENT_OTHER): Payer: Self-pay | Admitting: Naturopath

## 2015-12-12 DIAGNOSIS — F419 Anxiety disorder, unspecified: Secondary | ICD-10-CM

## 2015-12-12 DIAGNOSIS — R52 Pain, unspecified: Secondary | ICD-10-CM

## 2015-12-12 MED ORDER — IBUPROFEN 600 MG OR TABS
600.0000 mg | ORAL_TABLET | Freq: Four times a day (QID) | ORAL | 1 refills | Status: DC | PRN
Start: 2015-12-12 — End: 2016-05-21

## 2015-12-12 MED ORDER — FLUOXETINE HCL 20 MG OR CAPS
60.0000 mg | ORAL_CAPSULE | Freq: Every day | ORAL | 3 refills | Status: DC
Start: 2015-12-12 — End: 2016-06-24

## 2015-12-17 ENCOUNTER — Encounter (HOSPITAL_BASED_OUTPATIENT_CLINIC_OR_DEPARTMENT_OTHER): Payer: Medicare HMO | Admitting: Ophthalmology

## 2015-12-29 ENCOUNTER — Encounter (HOSPITAL_BASED_OUTPATIENT_CLINIC_OR_DEPARTMENT_OTHER): Payer: Medicare HMO | Admitting: Ophthalmology

## 2016-02-12 ENCOUNTER — Telehealth (HOSPITAL_BASED_OUTPATIENT_CLINIC_OR_DEPARTMENT_OTHER): Payer: Self-pay | Admitting: Naturopath

## 2016-02-12 NOTE — Telephone Encounter (Signed)
Message left on voice mail to return my call.

## 2016-02-12 NOTE — Telephone Encounter (Signed)
Patient was looking for a paper (form0 that was fax on Aug. 14/2017.  We have not received any form as of today.  I told her to call  her back in 10 minutes since she was looking for the fax number where she wrote the fax #.  I called after 10 minutes, then patient said, she does not want to talk to me.  I ask her if what was the reason why she does not want to talk to me , she said, she wants to talk to the nurse of [place].  I will re- bold and re-route the call.

## 2016-02-12 NOTE — Telephone Encounter (Addendum)
Received fax from [place] for provider to complete  This came from [place] state  To determine whether they will pay or continue to pay for patient unemployment benefits.  The form is in [employer].

## 2016-02-12 NOTE — Telephone Encounter (Signed)
She will need to make an appt to see Elan or me.

## 2016-02-12 NOTE — Telephone Encounter (Signed)
Holly Blair will fax form to [phone].

## 2016-02-12 NOTE — Telephone Encounter (Signed)
Pt like for clinic to call when fax arrived.

## 2016-02-12 NOTE — Telephone Encounter (Signed)
Adaleen called and stated she would like to speak to [facility] or her nurse regarding her paperwork. Please advise.    Thank you.

## 2016-02-13 NOTE — Telephone Encounter (Signed)
I called and spoke to Holly Blair.  I let her know that she will need to come to the clinic and see one of our providers to have the form filled and completed. (I reminded pt to [facility] the form with her, so that no time is wasted looking for the faxed copy.)    Pt said she will call the front desk and schedule an appt.     [name] RN, [facility]

## 2016-02-24 ENCOUNTER — Encounter (HOSPITAL_BASED_OUTPATIENT_CLINIC_OR_DEPARTMENT_OTHER): Payer: Medicare HMO | Admitting: Naturopath

## 2016-02-26 ENCOUNTER — Telehealth (HOSPITAL_BASED_OUTPATIENT_CLINIC_OR_DEPARTMENT_OTHER): Payer: Self-pay | Admitting: Naturopath

## 2016-02-26 ENCOUNTER — Encounter (HOSPITAL_BASED_OUTPATIENT_CLINIC_OR_DEPARTMENT_OTHER): Payer: Medicare HMO | Admitting: Naturopath

## 2016-02-26 NOTE — Telephone Encounter (Signed)
Appointment rescheduled to tomorrow as Holly Blair has job interview today at 2 pm.

## 2016-02-26 NOTE — Telephone Encounter (Signed)
RETURN CALL: Detailed message on voicemail only    SUBJECT:  General Message     REASON FOR REQUEST:Request for call back ASAP    MESSAGE: The patient stated that she is wondering if Eldred Manges, ARNP can have a phone visit with her (the patient) today since the only thing needed is to have [employer] sign a paper, and the patient stated that she does not believe it is necessary to take a bus to [place] to have this transaction take place.    The pt requested a call to verify the status of this request.  Thank you.

## 2016-02-27 ENCOUNTER — Encounter (HOSPITAL_BASED_OUTPATIENT_CLINIC_OR_DEPARTMENT_OTHER): Payer: Medicare HMO | Admitting: Naturopath

## 2016-03-12 ENCOUNTER — Telehealth (HOSPITAL_BASED_OUTPATIENT_CLINIC_OR_DEPARTMENT_OTHER): Payer: Self-pay | Admitting: Naturopath

## 2016-03-12 NOTE — Telephone Encounter (Signed)
Message left on Trac's voice mail to come in for visit with [name] for completion of [employer] Form- reduction of hours due to medical reasons.  Form is [employer] office.

## 2016-04-20 ENCOUNTER — Other Ambulatory Visit: Payer: Self-pay

## 2016-04-26 ENCOUNTER — Encounter (HOSPITAL_BASED_OUTPATIENT_CLINIC_OR_DEPARTMENT_OTHER): Payer: Medicare HMO | Admitting: Ophthalmology

## 2016-05-06 ENCOUNTER — Ambulatory Visit: Payer: Self-pay | Admitting: Physician Assistant

## 2016-05-06 ENCOUNTER — Encounter: Payer: Self-pay | Admitting: Physician Assistant

## 2016-05-06 VITALS — HR 72 | Temp 98.3°F | Ht 62.0 in | Wt 182.0 lb

## 2016-05-06 DIAGNOSIS — Z299 Encounter for prophylactic measures, unspecified: Secondary | ICD-10-CM

## 2016-05-06 NOTE — Progress Notes (Addendum)
S: here for biometric form to be filled out and fasting labs, states she has a regular doctor but they wouldn't fill out the form, no complaints, hx of high triglycerides, got better with diet, no hx of htn, ROS neg  O: vitals with mildly elevated bp at 140/90, lungs c t a, cv rrr, abd soft nontender, extremeties without swelling and has full rom  A: elevated bp without hx of htn  P: otc cinnamon, diet and exercise, labs drawn today, will fill out biometric form for patient  Called in gemfibrizol 600mg  bid to [facility] [address] in [place], recheck lipids in 3 monhts

## 2016-05-07 MED ORDER — GEMFIBROZIL 600 MG PO TABS: ORAL_TABLET | ORAL | 3 refills | Status: DC

## 2016-05-07 NOTE — Addendum Note (Signed)
Addended by: Versie Starks on: 05/07/2016 03:40 PM   Modules accepted: Orders

## 2016-05-08 LAB — CMP12+LP+TP+TSH+6AC+CBC/D/PLT
A/G RATIO: 1.5 (ref 1.2–2.2)
ALBUMIN: 4.2 g/dL (ref 3.5–5.5)
ALK PHOS: 103 IU/L (ref 39–117)
ALT: 17 IU/L (ref 0–32)
AST: 22 IU/L (ref 0–40)
BUN/Creatinine Ratio: 15 (ref 9–23)
BUN: 9 mg/dL (ref 6–24)
Basophils Absolute: 0 10*3/uL (ref 0.0–0.2)
Basos: 0 %
Bilirubin Total: 0.2 mg/dL (ref 0.0–1.2)
CHOL/HDL RATIO: 5.9 ratio — AB (ref 0.0–4.4)
CHOLESTEROL TOTAL: 237 mg/dL — AB (ref 100–199)
Calcium: 8.9 mg/dL (ref 8.7–10.2)
Chloride: 102 mmol/L (ref 96–106)
Creatinine, Ser: 0.59 mg/dL (ref 0.57–1.00)
EOS (ABSOLUTE): 0.1 10*3/uL (ref 0.0–0.4)
Eos: 1 %
Estimated CHD Risk: 1.7 times avg. — ABNORMAL HIGH (ref 0.0–1.0)
FREE THYROXINE INDEX: 2 (ref 1.2–4.9)
GFR calc non Af Amer: 109 mL/min/{1.73_m2} (ref >59)
GFR, EST AFRICAN AMERICAN: 125 mL/min/{1.73_m2} (ref >59)
GGT: 12 IU/L (ref 0–60)
GLOBULIN, TOTAL: 2.8 g/dL (ref 1.5–4.5)
Glucose: 76 mg/dL (ref 65–99)
HDL: 40 mg/dL (ref >39)
HEMATOCRIT: 36.4 % (ref 34.0–46.6)
Hemoglobin: 12.5 g/dL (ref 11.1–15.9)
IMMATURE GRANS (ABS): 0 10*3/uL (ref 0.0–0.1)
IMMATURE GRANULOCYTES: 0 %
Iron: 37 ug/dL (ref 27–159)
LDH: 263 IU/L — AB (ref 119–226)
LYMPHS: 35 %
Lymphocytes Absolute: 3.1 10*3/uL (ref 0.7–3.1)
MCH: 28 pg (ref 26.6–33.0)
MCHC: 34.3 g/dL (ref 31.5–35.7)
MCV: 82 fL (ref 79–97)
MONOCYTES: 8 %
MONOS ABS: 0.7 10*3/uL (ref 0.1–0.9)
NEUTROS PCT: 56 %
Neutrophils Absolute: 4.9 10*3/uL (ref 1.4–7.0)
Phosphorus: 3.3 mg/dL (ref 2.5–4.5)
Platelets: 198 10*3/uL (ref 150–379)
Potassium: 4.1 mmol/L (ref 3.5–5.2)
RBC: 4.46 x10E6/uL (ref 3.77–5.28)
RDW: 15.7 % — AB (ref 12.3–15.4)
Sodium: 141 mmol/L (ref 134–144)
T3 Uptake Ratio: 25 % (ref 24–39)
T4 TOTAL: 8 ug/dL (ref 4.5–12.0)
TRIGLYCERIDES: 415 mg/dL — AB (ref 0–149)
TSH: 1.21 u[IU]/mL (ref 0.450–4.500)
Total Protein: 7 g/dL (ref 6.0–8.5)
Uric Acid: 6.5 mg/dL (ref 2.5–7.1)
WBC: 8.7 10*3/uL (ref 3.4–10.8)

## 2016-05-08 LAB — VITAMIN D 25 HYDROXY (VIT D DEFICIENCY, FRACTURES): Vit D, 25-Hydroxy: 22.2 ng/mL — ABNORMAL LOW (ref 30.0–100.0)

## 2016-05-08 LAB — HEMOGLOBIN A1C
ESTIMATED AVERAGE GLUCOSE: 105 mg/dL
Hgb A1c MFr Bld: 5.3 % (ref 4.8–5.6)

## 2016-05-21 ENCOUNTER — Telehealth (HOSPITAL_BASED_OUTPATIENT_CLINIC_OR_DEPARTMENT_OTHER): Payer: Self-pay | Admitting: Naturopath

## 2016-05-21 DIAGNOSIS — R52 Pain, unspecified: Secondary | ICD-10-CM

## 2016-05-21 MED ORDER — IBUPROFEN 600 MG OR TABS
600.0000 mg | ORAL_TABLET | Freq: Four times a day (QID) | ORAL | 0 refills | Status: DC | PRN
Start: 2016-05-21 — End: 2016-08-30

## 2016-05-21 NOTE — Telephone Encounter (Signed)
(  TEXTING IS AN OPTION FOR [facility] ONLY)  Is this a [place] clinic? No      RETURN CALL: Detailed message on voicemail only      SUBJECT:  Refill Request    MEDICATION(S): Ibuprofen 600 MG Oral Tab  NEEDED BY: As soon as possible   PRESCRIBING PROVIDER: Eldred Manges   PHARMACY NAME AND LOCATION:     [facility]  [place] [address]  Phone: [phone] Fax: [phone]       ADDITIONAL INFORMATION: Please contact patient when refill sent to pharmacy above for courtesy     Thank you

## 2016-06-22 ENCOUNTER — Encounter: Payer: Self-pay | Admitting: Physician Assistant

## 2016-06-22 ENCOUNTER — Ambulatory Visit: Payer: Self-pay | Admitting: Physician Assistant

## 2016-06-22 VITALS — BP 130/79 | HR 104 | Temp 98.7°F

## 2016-06-22 DIAGNOSIS — J069 Acute upper respiratory infection, unspecified: Secondary | ICD-10-CM

## 2016-06-22 MED ORDER — AZITHROMYCIN 250 MG PO TABS: ORAL_TABLET | ORAL | 0 refills | Status: DC

## 2016-06-22 MED ORDER — BENZONATATE 200 MG PO CAPS: 200.0000 mg | ORAL_CAPSULE | Freq: Three times a day (TID) | ORAL | 0 refills | Status: DC | PRN

## 2016-06-22 NOTE — Progress Notes (Signed)
S: C/o runny nose and congestion for 6 days, no fever, chills, cp/sob, v/d; mucus was green this am but clear throughout the day, cough is sporadic, hacking, using otc meds without relief  Using otc meds:   O: PE: vitals wnl, nad, perrl eomi, normocephalic, tms dull, nasal mucosa red and swollen, throat injected, neck supple no lymph, lungs c t a, cv rrr, neuro intact  A:  Acute uri   P: drink fluids, continue regular meds , use otc meds of choice, return if not improving in 5 days, return earlier if worsening , zpack, tessalon perls

## 2016-06-24 ENCOUNTER — Telehealth (HOSPITAL_BASED_OUTPATIENT_CLINIC_OR_DEPARTMENT_OTHER): Payer: Self-pay | Admitting: Naturopath

## 2016-06-24 DIAGNOSIS — F419 Anxiety disorder, unspecified: Secondary | ICD-10-CM

## 2016-06-24 DIAGNOSIS — E7800 Pure hypercholesterolemia, unspecified: Secondary | ICD-10-CM

## 2016-06-24 DIAGNOSIS — B372 Candidiasis of skin and nail: Secondary | ICD-10-CM

## 2016-06-24 MED ORDER — ATORVASTATIN CALCIUM 10 MG OR TABS
10.0000 mg | ORAL_TABLET | Freq: Every day | ORAL | 0 refills | Status: DC
Start: 2016-06-24 — End: 2016-10-06

## 2016-06-24 MED ORDER — NYSTATIN 100000 UNIT/GM EX CREA
TOPICAL_CREAM | CUTANEOUS | 5 refills | Status: DC
Start: 2016-06-24 — End: 2016-11-10

## 2016-06-24 MED ORDER — FLUOXETINE HCL 20 MG OR CAPS
60.0000 mg | ORAL_CAPSULE | Freq: Every day | ORAL | 0 refills | Status: DC
Start: 2016-06-24 — End: 2016-11-10

## 2016-06-24 NOTE — Telephone Encounter (Signed)
Orders signed just make sure she schedules with me

## 2016-06-24 NOTE — Telephone Encounter (Signed)
Refill of anxiety med and Nystatin.  Please order at [facility] in [place] and call pt

## 2016-06-24 NOTE — Telephone Encounter (Signed)
Last yearly visit 07/15/2015.  Transferred tp PSS to schedule yearly.  Holly Blair will check with her pharmacy in 48 hours.

## 2016-06-24 NOTE — Telephone Encounter (Signed)
Message left on vm that Rx has been faxed to pharmacy and to keep her upcoming appointment with [name].

## 2016-07-16 ENCOUNTER — Ambulatory Visit (HOSPITAL_BASED_OUTPATIENT_CLINIC_OR_DEPARTMENT_OTHER): Payer: Medicare HMO | Admitting: Naturopath

## 2016-07-16 ENCOUNTER — Ambulatory Visit (HOSPITAL_BASED_OUTPATIENT_CLINIC_OR_DEPARTMENT_OTHER): Payer: Medicare HMO

## 2016-07-22 ENCOUNTER — Ambulatory Visit (HOSPITAL_BASED_OUTPATIENT_CLINIC_OR_DEPARTMENT_OTHER): Payer: Medicare HMO

## 2016-07-22 ENCOUNTER — Ambulatory Visit (HOSPITAL_BASED_OUTPATIENT_CLINIC_OR_DEPARTMENT_OTHER): Payer: Medicare HMO | Admitting: Naturopath

## 2016-08-05 ENCOUNTER — Ambulatory Visit: Payer: Medicare HMO | Attending: Diagnostic Radiology | Admitting: Naturopath

## 2016-08-05 ENCOUNTER — Other Ambulatory Visit: Payer: Self-pay | Admitting: Naturopath

## 2016-08-05 ENCOUNTER — Ambulatory Visit (HOSPITAL_BASED_OUTPATIENT_CLINIC_OR_DEPARTMENT_OTHER): Payer: Medicare HMO

## 2016-08-05 ENCOUNTER — Encounter (HOSPITAL_BASED_OUTPATIENT_CLINIC_OR_DEPARTMENT_OTHER): Payer: Self-pay | Admitting: Naturopath

## 2016-08-05 VITALS — BP 121/55 | HR 64 | Ht 64.25 in | Wt 338.4 lb

## 2016-08-05 DIAGNOSIS — Z13220 Encounter for screening for lipoid disorders: Secondary | ICD-10-CM | POA: Insufficient documentation

## 2016-08-05 DIAGNOSIS — L89891 Pressure ulcer of other site, stage 1: Secondary | ICD-10-CM | POA: Insufficient documentation

## 2016-08-05 DIAGNOSIS — N393 Stress incontinence (female) (male): Secondary | ICD-10-CM | POA: Insufficient documentation

## 2016-08-05 DIAGNOSIS — Z6841 Body Mass Index (BMI) 40.0 and over, adult: Secondary | ICD-10-CM

## 2016-08-05 DIAGNOSIS — Z0000 Encounter for general adult medical examination without abnormal findings: Secondary | ICD-10-CM | POA: Insufficient documentation

## 2016-08-05 DIAGNOSIS — Z1231 Encounter for screening mammogram for malignant neoplasm of breast: Secondary | ICD-10-CM | POA: Insufficient documentation

## 2016-08-05 DIAGNOSIS — Z131 Encounter for screening for diabetes mellitus: Secondary | ICD-10-CM | POA: Insufficient documentation

## 2016-08-05 DIAGNOSIS — Z5181 Encounter for therapeutic drug level monitoring: Secondary | ICD-10-CM

## 2016-08-05 DIAGNOSIS — R109 Unspecified abdominal pain: Secondary | ICD-10-CM | POA: Insufficient documentation

## 2016-08-05 LAB — COMPREHENSIVE METABOLIC PANEL
ALT (GPT): 10 U/L (ref 7–33)
AST (GOT): 12 U/L (ref 9–38)
Albumin: 3.7 g/dL (ref 3.5–5.2)
Alkaline Phosphatase (Total): 95 U/L (ref 34–121)
Anion Gap: 8 (ref 4–12)
Bilirubin (Total): 0.5 mg/dL (ref 0.2–1.3)
Calcium: 8.9 mg/dL (ref 8.9–10.2)
Carbon Dioxide, Total: 29 meq/L (ref 22–32)
Chloride: 103 meq/L (ref 98–108)
Creatinine: 0.73 mg/dL (ref 0.38–1.02)
GFR, Calc, African American: >60 mL/min/{1.73_m2} (ref >59)
GFR, Calc, European American: >60 mL/min/{1.73_m2} (ref >59)
Glucose: 89 mg/dL (ref 62–125)
Potassium: 4 meq/L (ref 3.6–5.2)
Protein (Total): 6.9 g/dL (ref 6.0–8.2)
Sodium: 140 meq/L (ref 135–145)
Urea Nitrogen: 9 mg/dL (ref 8–21)

## 2016-08-05 LAB — CHOLESTEROL (TOTAL & HDL)
Cholesterol/HDL Ratio: 3.2
HDL Cholesterol: 47 mg/dL (ref >39)
Total Cholesterol: 149 mg/dL (ref <200)

## 2016-08-05 LAB — HEMOGLOBIN A1C, RAPID: Hemoglobin A1C: 5.8 % (ref 4.0–6.0)

## 2016-08-05 NOTE — Patient Instructions (Addendum)
Urology clinic will call you to schedule an appointment.    Once I have your abdominal ultrasound result back I will refer you to consult with general surgery for possible hernia repair    Abdominal Wound: Change dressing twice weekly   Also talk with them about integrity of skin in this area  [facility] [place] [address]  [email or web address]    We live in [place] and don't see the sunshine nearly enough. I recommend Vitamin D supplementation for all my patients (especially October-June) for prevention of heart disease, osteoporosis, auto immune disease, diabetes, and for mood stability.   I generally recommend 2000 IU Vitamin D3 daily.     Leading a Healthy Life- Six tips to help improve your health and wellness   Eat well to give your body the energy it needs.   Stay or get active.   A healthy mind is part of a healthy body.   Practice safe living habits.   Keep your mind and body clean.   Get regular health care.     Tip #1: Eat well to give your body the energy it needs   Your body needs nutritious foods to stay strong and healthy. Here are some general eating guidelines:   . Eat real food (not processed, packaged, and already made)  . Eat adequate vegetables and fruits (6 servings daily). These foods contain invaluable micronutrients, phytochemicals, antioxidants and fiber.   . Choose whole grains over simple carbs. (brown rice over white rice)  . Eat more fiber (legumes, fresh fruit, ground flax seeds) Minimum daily fiber intake should be 30 grams!!!  . Eat healthy fats (olive oil, nuts, avocados, essential fatty acids) and avoid trans fats and high saturated fats.  . Eat adequate protein (0.5 g/lb body weight daily) from lean sources including: poultry, eggs, legumes, tofu, dairy products, nuts and seeds.   . Eat calcium rich foods (milk, cheese, yogurt, kale legumes, tofu, figs, sardines) 2-3 times daily.  Calcium is best absorbed through food.     . Eliminate the intake of refined sugar and refined carbohydrates.   . Eat healthy snacks with protein source.  Marland Kitchen Keep salt intake low. Less than 1500 mg daily.  . Drink adequate water--generally 64 ounces daily.  . Avoid sugary drinks (juice and soda)  . Minimal caffeine intake: no more than one caffeinated drink daily is recommended.    Tip #2: Stay or get active   Exercise for at least 30 minutes at a time, at least 4 times a week. Regular physical activity can help you:   . Live longer and feel better   . Be stronger and more flexible  . Build strong bones  . Prevent depression and anxiety  . Manage stress  . Strengthen your immune system  . Maintain a healthy body weight  . Improve brain function    Tip #3: Remember: A healthy mind is part of a healthy body   A good state of mind can help you make healthy choices. Here are a few tips for keeping your mind healthy:   . Reduce stress in your life!  Stress is one of the main causes of illness.  . Make some time every day for things that are fun and bring you joy.  . Move!  Exercise is a great way to reduce stress.  . Get enough sleep.  Lack of sleep reduces how well you can concentrate, it increases mood swings, and  increases stress hormone production in the body.  It has a ripple effect and pretty much effects everything!   . Ask your health care provider for help if you feel depressed or anxious for more than several days in a row.     Tip #4: Practice safe living habits   Accidents and Injuries   Accidents and injuries are the 5th leading cause of death in the U[place]   Women under age 84 are more likely to die in motor vehicle accidents than from any other cause.   Accidents in the home cause thousands of permanent injuries every year. The most common accidents are fires, falls, and drowning. To help yourself and your family stay safe:   Marland Kitchen Install smoke detectors on each floor of your home and know how to use them.  . Stay safe on the road: wear a seatbelt, do  not ride with someone who has been drinking or taking drugs, avoid cell phone use while driving, wear a helmet when riding a bicycle or motorcycle  Hand Hygiene   Protect yourself from germs by washing your hands often!     Tip #5: Keep your mind and body clean   . Smoking is the single worst thing you can do for your health.  It doubles your risk for developing both heart disease and cancer.  . Minimal alcohol intake: no more than one drink a night is recommended.    Tip #6: Get regular health care   Many people think they need to see their provider only when they are sick. But, health care providers can also help you stay healthy.   . Find a health care provider who works with you to manage your health.  This is a team game.  . Ask your health care provider if you have questions.  There is no such thing as a stupid question-only a question not asked.  . Don't save your concerns for your annual check-up.  Come in as they occur.  . Smile more-it makes you feel good!

## 2016-08-06 ENCOUNTER — Other Ambulatory Visit (HOSPITAL_BASED_OUTPATIENT_CLINIC_OR_DEPARTMENT_OTHER): Payer: Medicare HMO

## 2016-08-11 ENCOUNTER — Ambulatory Visit: Payer: Medicare HMO | Attending: Naturopath

## 2016-08-11 DIAGNOSIS — K469 Unspecified abdominal hernia without obstruction or gangrene: Secondary | ICD-10-CM

## 2016-08-12 ENCOUNTER — Encounter (HOSPITAL_BASED_OUTPATIENT_CLINIC_OR_DEPARTMENT_OTHER): Payer: Self-pay | Admitting: Clinical

## 2016-08-12 DIAGNOSIS — L8990 Pressure ulcer of unspecified site, unspecified stage: Secondary | ICD-10-CM | POA: Insufficient documentation

## 2016-08-12 NOTE — Progress Notes (Signed)
SOCIAL WORK NOTE    51 yo primary care pt referred to SW by [name], ARNP for help with care coordination.  Pt lives in a shelter in [place] and has [employer] and [employer].  She has a decubitus ulcer that she can't reach that needs to be tended to 2-3 times per week.  She plans to f/u with home nursing service or urgent care.  SW to call pt to ensure she is able to secure services.    SW called Holly Blair.  She says she went to urgent care in [facility] today and had it taken care of.  She says she can go back there 2-3 times per week until resolved.  She does not need assistance from [place] offered to give her my phone number in case anything changes but she declined and said she can call clinic or ecare if needed.    Ronnie Derby, LICSW  [facility] Social Worker

## 2016-08-12 NOTE — Progress Notes (Signed)
SUBJECTIVE:    Holly Blair is a 51 year old female here today for PHV.     Other problems or concerns today:     She continues to have pannus pain.  It was evaluated last year with ultrasound an umbilicus fat hernia was found. The pain is consistent and superficial.  No changes to bowel movements, diarrhea, constipation, or abdominal cramping.     She reports urine leakage with coughing and sneezing or prolonged activity.  It has become quite bothersome.  She wears a pad for absorption.  She is interested in treatment.     Health Maintenance   Topic Date Due   . Cervical Cancer Screening (Pap Smear)  06/27/2014   . Depression Monitoring (PHQ-9)  06/12/2015   . Influenza Vaccine (1) 03/21/2016   . Diabetes Screening  08/06/2019   . Lipid Disorders Screening  08/05/2021   . Tetanus Vaccine  06/11/2024   . HIV Screening  Addressed       Patient Active Problem List   Diagnosis   . Depression, major, recurrent ([place])   . Acne   . Severe obesity   . Chronic knee pain   . Chronic back pain   . Senile nuclear cataract   . Myopia with astigmatism and presbyopia   . Mental developmental delay   . Body mass index 50.0-59.9, adult ([place])   . Morbid obesity with BMI of 50.0-59.9, adult ([place])   . Hyperlipidemia   . Pre-bariatric surgery nutrition evaluation   . Systolic murmur       Past Medical History:   Diagnosis Date   . Acne    . Anxiety state, unspecified    . Arthritis    . Cataract    . Chronic back pain    . Chronic knee pain    . Depression, major, recurrent ([place])    . Heart murmur    . Obesity        Past Surgical History:   Procedure Laterality Date   . LIG/TRNSXJ FLP TUBE ABDL/VAG APPR UNI/BI         Current Outpatient Prescriptions   Medication Sig Dispense Refill   . Acetaminophen 500 MG Oral Tab Take 1 tablet (500 mg) by mouth every 6 hours as needed for pain. 60 tablet 0   . Atorvastatin Calcium 10 MG Oral Tab Take 1 tablet (10 mg) by mouth daily. 90 tablet 0   . Clotrimazole 1 % External Cream Apply to  affected area on abdomen 2 times a day. For fungal rash. 45 g 2   . FISH OIL  None Entered     . FLUoxetine HCl 20 MG Oral Cap Take 3 capsules (60 mg) by mouth daily. 270 capsule 0   . Ibuprofen 600 MG Oral Tab Take 1 tablet (600 mg) by mouth every 6 hours as needed for pain. Take with food and plenty of water. 90 tablet 0   . Multiple Vitamin (MULTI VITAMIN DAILY OR) Take by mouth daily.     Marland Kitchen Nystatin 100000 UNIT/GM External Cream Apply to rash underneat breasts 2 times a day 30 g 5   . Nystatin 100000 UNIT/GM External Powder Apply underneath abdomen twice daily after Clotrimazole application 60 g 2   . TraZODone HCl 50 MG Oral Tab Take 1 tablet (50 mg) by mouth at bedtime as needed for sleep. 90 tablet 0     No current facility-administered medications for this visit.  Review of patient's allergies indicates:  No Known Allergies      SOCIAL HISTORY  Holly Blair is currently not working.  She is leaving in a women's shelter in [place].      FAMILY HISTORY  Family History     Problem (# of Occurrences) Relation (Name,Age of Onset)    Cancer (1) Mother: lung cancer, long history of smkoing    Hypertension (1) Maternal Grandfather          GYN HISTORY  Obstetric History    G0   P0   T0   P0   A0   L0     SAB0   TAB0   Ectopic0   Multiple0   Live Births0      LMP: 08/03/2016   Menses: menses regular every month   Last pap: 2013  Pap history: no history of abnormal paps  Other gyn history: none    SEXUAL HISTORY  Sexual activity: not at present  History of STD: none known  Sexual concerns: No    NUTRITION  Current dietary habits: no assessed today    EXERCISE  Current exercise habits: walking daily for at least 10-20 minutes     HABITS  Smoker: no  Alcohol: no  Recreational Drug Use: no    SAFETY  History of sexual or physical abuse: No  Safe in current living space: YES  Regular seat belt use: YES  Stress: Yes: related to living and job situation    ROS:  All other systems reviewed and are negative other  than what's listed in HPI       OBJECTIVE:  BP 121/55   Pulse 64   Ht 5' 4.25" (1.632 m)   Wt (!) 338 lb 6.4 oz (153.5 kg)   LMP 08/03/2016 (Exact Date)   SpO2 96%   BMI 57.64 kg/m   Body mass index is 57.64 kg/m.  General appearance: healthy, alert, no distress  Mood/affect: Pleasant female. Mood and affect appropriate.  Skin: 1 cm single decubitus ulcer under pannus.  Not painful.  Widespread satellite lesions under pannus and in pelvic region   HEENT:Ear:  External ears normal. Canals clear. TM's normal. and Throat:  oropharynx w/o lesions, erythema, exudate; MMM and no tonsillar enlargement  Neck: supple. No adenopathy. Thyroid symmetric, normal size, without nodules  Respiratory: Respirations even and unlabored. Clear lung fields without crackles, wheezing or expiratory prolongation  Cardiovascular: S1 and S2 present. RRR without murmurs.  Abdomen: soft, non-tender. BS normal. No masses or organomegaly  Breasts: No obvious deformity or mass to inspection, nipples everted bilaterally, no skin lesion or nipple discharge, no mass palpated, no axillary lymphadenopathy        ASSESSMENT/PLAN:    Holly Blair was seen today for annual exam.    Diagnoses and all orders for this visit:    Annual physical exam  Health Care Maintenance:  Screening Labs: Cholesterol CMP, HgbA1C  Pap smear: she will need to return for pap smear  STD Check: not indicated  Additional Recommendations: discussed importance of skin/wound care    Preventive counseling:   health maintenance  Immunizations   Dietary Counseling   vitamin D supplementation   proper exercise   Follow-up: for pap smear    Screening cholesterol level  -     CHOLESTEROL (TOTAL & HDL)    Medication monitoring encounter  -     COMPREHENSIVE METABOLIC PANEL    Diabetes mellitus screening  -     HEMOGLOBIN  A1C, RAPID    Stress incontinence in female  -     REFERRAL TO UROLOGY  -     U/A NONAUTO DIPSTICK ONLY, ONSITE    Abdominal pain, unspecified abdominal location  -      [place] ABD LIMITED TO RULE OUT HERNIA    Decubitus ulcer of other site, stage 1  Patient given duoderm banages and recommended having them changed 2-3 times a week. She is able to get nursing care through her insurance that will come to the shelter since she is unable to reach the area. If she is unable to get nursing visits she will go to [facility]. She will need regular skin/wound care until she has complete resolution.   Referral to Social work to call for follow-up

## 2016-08-18 ENCOUNTER — Encounter (HOSPITAL_BASED_OUTPATIENT_CLINIC_OR_DEPARTMENT_OTHER): Payer: Self-pay | Admitting: Naturopath

## 2016-08-18 DIAGNOSIS — K469 Unspecified abdominal hernia without obstruction or gangrene: Secondary | ICD-10-CM

## 2016-08-18 NOTE — Telephone Encounter (Signed)
Please let patient know referral has been made and she should get a call to schedule an appointment.

## 2016-08-18 NOTE — Telephone Encounter (Signed)
Yes do you mind pending

## 2016-08-18 NOTE — Telephone Encounter (Signed)
Do you want to refer to general surgery for hernia?

## 2016-08-26 NOTE — Progress Notes (Signed)
UROGYNECOLOGY NEW PATIENT EVALUATION    ID/CC: [name] is a 51 year old P4 with chief complaint of stress urinary incontinence.    PCP: Holly Blair, ARNP  Referring MD: Same    HPI:  [name] presents today to talk about stress urinary incontinence. Symptoms first started in early 2017. Has been leaking about the same since then. Leaks about 5x/day, moderate amount. Leaks with urge to go and with sneezes/cough. Urge is more frequent. Denies dysuria, hematuria, suprapubic pain. Notices that it's worse when drinking more water. Has tried kegels without success. Is using 4 pads per day. Also has constipation. Denies bulge symptoms.       Pelvic Floor Distress Inventory 20  Instructions:  Please answer all of the questions in the following survey.  These questions will ask you if you have certain bowel, bladder, or pelvic symptoms and, if you do, how much they bother you.  Answer these by putting an X in the appropriate box or boxes.  While answering these questions, please consider your symptoms over the last 3 months.   Pelvic Organ Prolapse Distress Inventory 6 (POPDI-6)   NO   YES If YES, how much does it bother you?   Do you  ?   1  Not at all 2  Some what 3  Moder- ately 4  Quite a bit   1. Usually experience pressure in the lower abdomen?     [x]      []      []      []      []      []    2. Usually experience heaviness or dullness in the pelvic area?       [x]        []        []        []        []        []    3. Usually have a bulge or something falling out that you can see or feel in your vaginal area?       [x]        []        []        []        []        []    4. Ever have to push on the vagina or around the rectum to have or complete a bowel movement?       [x]        []        []        []        []        []    5. Usually experience a feeling of incomplete bladder emptying?       []        [x]        []        []        [x]        []    6. Ever have to push up on a bulge in the vaginal area with  your fingers to start or complete urination?       [x]        []        []        []        []        []    Colorectal-Anal  Distress Inventory 8 (CRADI-8):   7.  Feel you need to strain too hard to have a  bowel movement?       []        [x]        []        []        []        [x]    8.  Feel you have not completely emptied your bowels at the end of a bowel movement?       [x]        []        []        []        []        []    9.  Usually lose stool beyond your control if your stool is well formed?       [x]        []        []        []        []        []    10. Usually lose stool beyond your control if your stool is loose?       [x]        []        []        []        []        []    11. Usually lose gas from the rectum beyond your control?       [x]        []        []        []        []        []    12. Usually have pain when you pass your stool?       [x]        []        []        []        []        []    13. Experience a strong sense of urgency and have to rush to the bathroom to have a bowel movement?       []        [x]        []        []        []        [x]    14. Does part of your bowel ever pass through the rectum and bulge outside during or after a bowel movement?       [x]        []        []        []        []        []    Urinary Distress Inventory 6 (UDI-6):   15. Usually experience frequent urination?     []      [x]      []      []      [x]      []    16. Usually experience urine leakage associated with a feeling of urgency, that is, a strong sensation of needing to go to the bathroom?       []        [x]        []        []        [x]        []    17. Usually experience urine leakage related to coughing, sneezing, or laughing?       []        [x]        []        [  x]       []        []    18. Usually experience small amounts of urine leakage  (that is, drops)?       [x]        []        []        []        []        []    19. Usually experience difficulty emptying your bladder?         [x]      []      []      []      []      []    20. Usually experience pain or  discomfort in the lower abdomen or genital region?       [x]        []          []        []        []        []      Bladder Function/Urine Leakage:  Bladder infections in the past year 0    Leaks or loses urine urine when they don't mean to: YES  Usually leaks a moderate amount  How often leakage or loss of urine: 5x/day  Lose urine while sleeping: YES, sometimes loses a small amount   Usually wears maxipad for protection.  If pads, uses 4 per day    Feels she goes to the bathroom too often: YES  On average:  8-12 Voids per day 3 Voids per night  Treatment for the problem has included: kegels, pads     Dietary irritants/Intake:   1 cups of coffee per day  1 cups of tea/day  1 cups of juice/day  1 colas per day  6 cups of water per day    Feelings about urinary problem if couldn't be improved: 3  0 - pleased - 5- terrible    Bowel/Stooling History:  2-4 BMs per week  Consistency of typical stool: constipated    Patient Active Problem List    Diagnosis Date Noted   . Mental developmental delay [F81.9] 03/22/2012   . Decubitus ulcer [L89.90] 08/12/2016   . Systolic murmur [L87.5] 64/33/2951   . Morbid obesity with BMI of 50.0-59.9, adult ([place]) [E66.01, Z68.43] 10/17/2014   . Hyperlipidemia [E78.5] 10/17/2014   . Body mass index 50.0-59.9, adult ([place]) [Z68.43] 12/25/2012   . Senile nuclear cataract [H25.10] 07/13/2011   . Myopia with astigmatism and presbyopia [H52.10, H52.209, H52.4] 07/13/2011   . Depression, major, recurrent ([place]) [F33.9]    . Acne [L70.9]    . Severe obesity [E66.01]    . Chronic knee pain [M25.569, G89.29]    . Chronic back pain [M54.9, G89.29]      Past Medical History:   Diagnosis Date   . Acne    . Anxiety state, unspecified    . Arthritis    . Cataract    . Chronic back pain    . Chronic knee pain    . Depression, major, recurrent ([place])    . Heart murmur    . Obesity      Past Surgical History:   Procedure Laterality Date   . LIG/TRNSXJ FLP TUBE ABDL/VAG APPR UNI/BI       Social History     Social  History   . Marital status: Single     Spouse name: N/A   . Number of children: N/A   . Years of  education: N/A     Occupational History   . 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 Smoking status: Never Smoker   . Smokeless tobacco: Never Used   . Alcohol use No   . Drug use: No   . Sexual activity: No     Other Topics Concern   . Not on file     Social History Narrative   . No narrative on file     Current Outpatient Prescriptions   Medication Sig Dispense Refill   . Acetaminophen 500 MG Oral Tab Take 1 tablet (500 mg) by mouth every 6 hours as needed for pain. 60 tablet 0   . Atorvastatin Calcium 10 MG Oral Tab Take 1 tablet (10 mg) by mouth daily. 90 tablet 0   . Clotrimazole 1 % External Cream Apply to affected area on abdomen 2 times a day. For fungal rash. 45 g 2   . FLUoxetine HCl 20 MG Oral Cap Take 3 capsules (60 mg) by mouth daily. 270 capsule 0   . Ibuprofen 600 MG Oral Tab Take 1 tablet (600 mg) by mouth every 6 hours as needed for pain. Take with food and plenty of water. 90 tablet 0   . Nystatin 100000 UNIT/GM External Cream Apply to rash underneat breasts 2 times a day 30 g 5   . Nystatin 100000 UNIT/GM External Powder Apply underneath abdomen twice daily after Clotrimazole application 60 g 2   . TraZODone HCl 50 MG Oral Tab Take 1 tablet (50 mg) by mouth at bedtime as needed for sleep. 90 tablet 0     No current facility-administered medications for this visit.      Review of patient's allergies indicates:  No Known Allergies    Family History     Problem (# of Occurrences) Relation (Name,Age of Onset)    Cancer (1) Mother: lung cancer, long history of smkoing    Hypertension (1) Maternal Grandfather        Family history of breast cancer:NO, ovarian cancer: NO, uterine cancer: NO or colon cancer; YES, MGM  Urinary Incontinence: NO, Pelvic Organ Prolapse  NO    OB/GYN History:  How many children: 4  Vaginal: 4; Cesarean: 0  Largest: 9 #. 3 oz, shoulder dystocia,  possible higher order laceration  Birth control: NO  Menses: Monthly, q2-6 weeks, sometimes heavier than normal   Menopause: NO, has started having hot flashes   Abnormal PAP smears: NO, last 2013 cotesting negative, scheduled to have repeat with PCP  Estrogen use: NO   IF NO, ever use pill or patch: YES, remote history of OCPs   IF NO, ever use vaginal: NO    Sexual Function:  Sexually active: NO  Vaginal intercourse: N/A  Sexual activities limited due to prolapse: N/A  Sex life satisfactory: N/A  Pain with intercourse: NO   Leak with intercourse: N/A  Specific sexual concerns to address: NO    Psychological:  Over past 2 weeks:  Little interest or pleasure in doing things: 0  Feeling down, depressed or hopeless: 0    ROS:  Positive:   Skin, Heart and vessels, Bowels, GU, Endo,  Psych  Negative:   Constitutional, ENT, Resp, MS, Neuro    See intake form page 7/8 for sub-level detail.    PHYSICAL EXAM:  LMP 08/03/2016 (Exact Date)   Gen: Well nourished, well developed woman in NAD. Poor dentition.  Neck: Supple  with full ROM. No carotid bruits.  Resp: Clear to auscultation bilaterally with no use of accessory muscles.  CV: Normal S1, S2 without audible murmurs rubs or gallops. Normal rate and rhythm.  ABD: Obsee, soft, NTP, no masses, hernia palpable in RLQ  Ext: 2+ distal pulses without edema in the extremities  Lymph: No inguinal or supraclavicular adenopathy  Skin: Warm and dry. Yeast dermatitis under pannus  Psych: No undue anxiety or agitation during today's visit    PELVIC: Exam significantly limited by body habitus  External genitalia: grossly normal with no lesion and normal hair pattern  Urethra: midline, normal size. No abnormality along the anterior wall consistent with diverticulum  Urethral hypermobility: NO  Positive stress test: NO  Bladder: soft, NTP with no pain on the bladder base. No masses palpable.   Vagina: pink and well rugated, thick white discharge, small amount of anterior wall bulge  palpable but not visible with valsalva   Estrogenization: Normal  Kegel strength: Unable to assess as patient valsalved when instructed to perform kegel    LABS/PROCEDURES:  None  UA on site: sent to lab    ASSESSMENT AND PLAN:  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   I explained the pathophysiology of urge urinary incontinence emphasizing the functional difference with Stress incontinence.  I described how dietary irritants affect the bladder and lead to urgency and frequency. I explained how women can learn to void frequency in order to avoid the risk of incontinence, however this can cause problems itself.  I the process of bladder retraining to reverse this problem.  I explained that the first line management for UUI consists of dietary irritant reduction, pelvic floor exercises, bladder retraining and use of medications (typically anti-cholinergics). Discussed the role of weight loss as well. I explained how pelvic floor contractions can be used to resist loss of urine and dampen the urge to void and recommended PT directed pelvic floor exercises. Finally I explained the use of medications, including anti-cholinergics, mirabegron and vaginal estrogen. I pointed out that the majority of medications are comparably effective however there can be a differential response so trying at least 2 to 3 is recommended. I explained there is also some evidence to suggest medicine combined with physical therapy is more effective than either approach alone. I then briefly explained that while most women respond to the first line therapies some don't but there are other options should this fail, such as neuromodulation and bladder Botox.    A urinalysis and urine culture were sent today to make sure this is not an underlying cause of her symptoms.    She was given a handout of dietary irritants and will work to eliminate these from her diet.     She was prescribed  oxybutynin 5mg  BID to TID.     We will defer PT given patient's limited ability to attend appointments.     She will return in 2-3 months for follow up.

## 2016-08-27 ENCOUNTER — Other Ambulatory Visit (INDEPENDENT_AMBULATORY_CARE_PROVIDER_SITE_OTHER): Payer: Self-pay | Admitting: Obstetrics & Gynecology

## 2016-08-27 ENCOUNTER — Ambulatory Visit: Payer: Medicare HMO | Attending: Obstetrics/Gynecology | Admitting: Obstetrics/Gynecology

## 2016-08-27 ENCOUNTER — Encounter (INDEPENDENT_AMBULATORY_CARE_PROVIDER_SITE_OTHER): Payer: Self-pay | Admitting: Obstetrics/Gynecology

## 2016-08-27 VITALS — BP 142/79 | HR 72 | Wt 335.0 lb

## 2016-08-27 DIAGNOSIS — Z6841 Body Mass Index (BMI) 40.0 and over, adult: Secondary | ICD-10-CM

## 2016-08-27 DIAGNOSIS — N3000 Acute cystitis without hematuria: Secondary | ICD-10-CM

## 2016-08-27 DIAGNOSIS — N3941 Urge incontinence: Secondary | ICD-10-CM

## 2016-08-27 LAB — URINALYSIS WITH REFLEX CULTURE
Bilirubin (Qual), URN: NEGATIVE
Epith Cells_Renal/Trans,URN: NEGATIVE /HPF
Glucose Qual, URN: NEGATIVE mg/dL
Ketones, URN: NEGATIVE mg/dL
Leukocyte Esterase, URN: POSITIVE — AB
Nitrite, URN: POSITIVE — AB
Occult Blood, URN: NEGATIVE
Protein (Alb Semiquant), URN: NEGATIVE mg/dL
RBC, URN: NEGATIVE /HPF
Specific Gravity, URN: 1.015 g/mL (ref 1.002–1.027)
pH, URN: 6.5 (ref 5.0–8.0)

## 2016-08-27 LAB — REFLEX CULTURE FOR UA

## 2016-08-27 MED ORDER — OXYBUTYNIN CHLORIDE 5 MG OR TABS
5.0000 mg | ORAL_TABLET | Freq: Two times a day (BID) | ORAL | 12 refills | Status: DC
Start: 2016-08-27 — End: 2018-07-28

## 2016-08-27 NOTE — Patient Instructions (Signed)
Patient Education     Urinary Incontinence, Female (Adult)  Urinary incontinence means loss of control of the bladder. This problem affects many women, especially as they get older. If you have incontinence, you may be embarrassed to ask for help. But know that this problem can be treated.     Types of Incontinence  There are different types of incontinence. Two of the main types are described here. You can have more than one type.  Stress incontinence: With this type, urine leaks when pressure (stress) is put on the bladder. This may happen when you cough, sneeze, or laugh. Stress incontinence most often occurs because the pelvic floor muscles that support the bladder and urethra are weak. This can happen after pregnancy and vaginal childbirth or a hysterectomy. It can also be due to excess body weight or hormone changes.  Urge incontinence (also called overactive bladder): With this type, a sudden urge to urinate is felt often. This may happen even though there may not be much urine in the bladder. The need to urinate often during the night is common. Urge incontinence most often occurs because of bladder spasms. This may be due to bladder irritation or infection. Damage to bladder nerves or pelvic muscles, constipation, and certain medicines can also lead to urge incontinence.  Treatment of urinary incontinence depends on the cause. Further evaluation is needed to find the type you have. This will likely include an exam and certain tests. Based on the results, you and your healthcare provider can then plan treatment. Until a diagnosis is made, the home care tips below can help relieve symptoms.  Home care  · Do pelvic floor muscle (Kegel) exercises, if they are prescribed. The pelvic floor muscles help support the bladder and urethra. Many women find that their symptoms improve when doing special exercises that strengthen these muscles. To do the exercises:  ¨ Contract the muscles you would use to stop your  stream of urine, but do this when you’re not urinating. Hold for 10 seconds, then relax. Repeat 10 to 20 times in a row, at least 3 times a day. Your provider may give you other instructions for how to do the exercises and how often.  · Keep a bladder diary. This helps track how often and how much you urinate over a set period of time. Bring this diary with you to your next visit with the provider. The information can help your provider learn more about your bladder problem.  · Lose weight, if advised to by your provider. Excess weight puts pressure on the bladder. Your provider can help you create a weight-loss plan that’s right for you. This may include exercising more and making certain diet changes.  · Avoid foods and drinks that may irritate the bladder. These can include alcohol and caffeinated drinks.  · Quit smoking. Smoking and other tobacco use can lead to chronic cough that strains the pelvic floor muscles. Smoking may also damage the bladder and urethra. Talk with your provider about treatments or methods you can use to quit smoking.  · If drinking large amounts of fluid causes you to have symptoms, you may be advised to limit your fluid intake. You may also be advised to drink most of your fluids during the day and to limit fluids at night.  · If you’re worried about urine leakage or accidents, you may wear absorbent pads to catch urine. Change the pads often. This helps reduce discomfort. It may also reduce the risk of skin or bladder infections.  Follow-up care  Follow up with your healthcare provider as   directed. If testing was done, you’ll be told the results as soon as they are ready. It may take some to find the right treatment for your problem. Work closely with your provider to ensure you get the best care for your needs. Your treatment plan may include special therapies or medicines. Certain procedures or surgery may also be options. Be sure to discuss any questions you have with your  provider.  When to seek medical advice  Call the healthcare provider right away if any of these occur:  · Fever of 100.4°F (38°C) or higher, or as directed by your provider  · Bladder pain or fullness  · Abdominal swelling  · Nausea or vomiting  · Back pain  · Weakness, dizziness or fainting  Date Last Reviewed: 01/13/2014  © 2000-2017 [employer] [address], [place] [address]. All rights reserved. This information is not intended as a substitute for professional medical care. Always follow your healthcare professional's instructions.

## 2016-08-27 NOTE — Progress Notes (Signed)
I saw and evaluated the patient. I have reviewed the resident's documentation and agree with it.    Rodricus Candelaria, MD MPH  Associate Professor  Division of Urogynecology  Dept. of Obstetrics and Gynecology  [employer]

## 2016-08-29 LAB — URINE C/S: Culture: >100000

## 2016-08-30 ENCOUNTER — Encounter (INDEPENDENT_AMBULATORY_CARE_PROVIDER_SITE_OTHER): Payer: Self-pay

## 2016-08-30 ENCOUNTER — Encounter (HOSPITAL_BASED_OUTPATIENT_CLINIC_OR_DEPARTMENT_OTHER): Payer: Self-pay | Admitting: Naturopath

## 2016-08-30 ENCOUNTER — Other Ambulatory Visit (HOSPITAL_BASED_OUTPATIENT_CLINIC_OR_DEPARTMENT_OTHER): Payer: Self-pay | Admitting: Naturopath

## 2016-08-30 ENCOUNTER — Telehealth (INDEPENDENT_AMBULATORY_CARE_PROVIDER_SITE_OTHER): Payer: Self-pay

## 2016-08-30 DIAGNOSIS — G8929 Other chronic pain: Secondary | ICD-10-CM

## 2016-08-30 DIAGNOSIS — R52 Pain, unspecified: Secondary | ICD-10-CM

## 2016-08-30 MED ORDER — NITROFURANTOIN MONOHYD MACRO 100 MG OR CAPS
100.0000 mg | ORAL_CAPSULE | Freq: Two times a day (BID) | ORAL | 0 refills | Status: DC
Start: 2016-08-30 — End: 2017-02-09

## 2016-08-30 NOTE — Telephone Encounter (Signed)
-----   Message from Ander Slade, MD sent at 08/30/2016  6:10 AM PDT -----  Can you let her know I sent in Abx order for her and I would like her to come back in a couple of weeks to see if symptoms different after treatment.  -M  ----- Message -----  From: Hiller, User  Sent: 08/27/2016   9:29 AM  To: Ander Slade, MD

## 2016-08-30 NOTE — Telephone Encounter (Signed)
Called pt in response to her ecare msg.   She thought that her HbA1C needed to be repeated because she was not fasting. Explained that fasting is not required and this does not need to be redrawn.     Last pap was 06/26/11.   Elan asked her to schedule an appt to discuss blood sugar control.    Appt scheduled for tomorrow.

## 2016-08-30 NOTE — Telephone Encounter (Signed)
Pt read eCare message regarding UTI and abx.

## 2016-08-30 NOTE — Telephone Encounter (Signed)
Called the patient and made her appointment.

## 2016-08-30 NOTE — Addendum Note (Signed)
Addended by: Ander Slade on: 08/30/2016 06:10 AM     Modules accepted: Orders

## 2016-08-31 ENCOUNTER — Encounter (HOSPITAL_BASED_OUTPATIENT_CLINIC_OR_DEPARTMENT_OTHER): Payer: Medicare HMO | Admitting: Naturopath

## 2016-08-31 MED ORDER — ACETAMINOPHEN 500 MG OR TABS
500.0000 mg | ORAL_TABLET | Freq: Four times a day (QID) | ORAL | 0 refills | Status: DC | PRN
Start: 2016-08-31 — End: 2019-02-05

## 2016-08-31 MED ORDER — IBUPROFEN 600 MG OR TABS
600.0000 mg | ORAL_TABLET | Freq: Four times a day (QID) | ORAL | 0 refills | Status: DC | PRN
Start: 2016-08-31 — End: 2017-03-21

## 2016-09-03 ENCOUNTER — Encounter (HOSPITAL_BASED_OUTPATIENT_CLINIC_OR_DEPARTMENT_OTHER): Payer: Medicaid Other | Admitting: Naturopath

## 2016-09-08 ENCOUNTER — Encounter (HOSPITAL_BASED_OUTPATIENT_CLINIC_OR_DEPARTMENT_OTHER): Payer: Medicaid Other | Admitting: Surgery

## 2016-09-17 ENCOUNTER — Encounter (INDEPENDENT_AMBULATORY_CARE_PROVIDER_SITE_OTHER): Payer: Medicaid Other | Admitting: Obstetrics/Gynecology

## 2016-09-24 ENCOUNTER — Encounter (INDEPENDENT_AMBULATORY_CARE_PROVIDER_SITE_OTHER): Payer: Medicaid Other | Admitting: Obstetrics/Gynecology

## 2016-10-06 ENCOUNTER — Encounter (HOSPITAL_BASED_OUTPATIENT_CLINIC_OR_DEPARTMENT_OTHER): Payer: Medicaid Other | Admitting: Surgery

## 2016-10-06 ENCOUNTER — Other Ambulatory Visit (HOSPITAL_BASED_OUTPATIENT_CLINIC_OR_DEPARTMENT_OTHER): Payer: Self-pay | Admitting: Naturopath

## 2016-10-06 DIAGNOSIS — E7800 Pure hypercholesterolemia, unspecified: Secondary | ICD-10-CM

## 2016-10-07 MED ORDER — ATORVASTATIN CALCIUM 10 MG OR TABS
ORAL_TABLET | ORAL | 0 refills | Status: DC
Start: 2016-10-07 — End: 2017-01-27

## 2016-10-07 NOTE — Telephone Encounter (Signed)
Refill request received for atorvastatin has been approved.  However last found LDL was on 11/07/13.    Forward to PCP to review.

## 2016-11-03 ENCOUNTER — Encounter (HOSPITAL_BASED_OUTPATIENT_CLINIC_OR_DEPARTMENT_OTHER): Payer: Self-pay | Admitting: Surgery

## 2016-11-03 ENCOUNTER — Ambulatory Visit: Payer: Medicare HMO | Attending: Surgery | Admitting: Surgery

## 2016-11-03 VITALS — BP 147/73 | HR 77 | Temp 96.1°F | Ht 64.25 in | Wt 335.5 lb

## 2016-11-03 DIAGNOSIS — Z6841 Body Mass Index (BMI) 40.0 and over, adult: Secondary | ICD-10-CM

## 2016-11-03 DIAGNOSIS — K439 Ventral hernia without obstruction or gangrene: Secondary | ICD-10-CM | POA: Insufficient documentation

## 2016-11-03 DIAGNOSIS — F819 Developmental disorder of scholastic skills, unspecified: Secondary | ICD-10-CM | POA: Insufficient documentation

## 2016-11-03 NOTE — Patient Instructions (Signed)
We discussed the signs and symptoms of incarceration/strangulation including worsening pain, redness at the site, nausea, vomiting, fevers and need to go directly to the ED if these happen to be assessed for possible need for emergent operation.      Given your current weight, the chance of a long term repair of your hernia is very small and I would recommend followup with the bariatric surgical team.  We will place that referral for you today and they will call you for an appointment.      If you symptoms get worse or you require an emergency room visit- I would want to see you back in clinic.

## 2016-11-03 NOTE — Progress Notes (Signed)
UMBILICAL/EPIGASTRIC HERNIA OUTPATIENT CONSULTATION    CHIEF COMPLAINT: New Patient Consult (Hernia )       HISTORY OF PRESENT ILLNESS:  Holly Blair is a 51 year old female referred by Dr  Leafy Half for evaluation of abdominal hernia.  She has had a tubal ligation aprox 13 yrs ago but no additional abdominal surgery.  She first noticed symptoms aprox a year ago-- including pain and a burning sensation across her abdomen.  She has not had nausea or vomiting but has the feeling of a bulge at this site. She has normal bowel movements without constipation.  She has urinary incontinence and is on medication.  She does not do heavy lifting. 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  She has considered bariatric surgery but at the time had trouble getting to [facility] clinic for apts.  She is interested in weight loss options.      Outside records were reviewed.  Relevant workup to date is summarized below:  Labs: HbA1c 5.8   Normal Cr and LFTs   No prior colonoscopy.  She has had mammograms in the past     ======   08/11/16 [place]  There is an approximate 3.6 cm abdominal wall defect seen superior to the umbilicus, corresponding to the area of pain.   Location: supra umbilical region   Size: 3.6 cm   Contents: Fat containing   Reducibility: Partially reducible   Tenderness: mildly tender     Impression   =========     Suboptimal examination due to the patient's body habitus. A fat-containing, partially reducible abdominal wall hernia.   ================================================================================     Report Author: Delma Officer, MD   Report Author: Margarite Gouge, MD   Report Approver: Delma Officer, MD     Creation Date: 08/11/2016 04:32 PM   Approval Date: 08/11/2016 04:32 PM     THIS REPORT WAS RECEIVED FROM THE [facility] INFORMATION SYSTEM      ================================================================================             Result Type: US Abdomen Limited  Service Date: December 27, 2014 14:56   Result Status: Authenticated  Result Title: _RAD: US ABDOMEN LTD FU  Performed By: [id number] Roselyn Bering on December 27, 2014 15:51   Cosigned By: Lorenda Hatchet MD, [place]   Encounter info: [phone], [facility], Ancillary, 12/20/2014 - 01/19/2015  Contributor system: Shonna Chock    * Final Report *    Accession No: [id number]   ~Indication  ========     Right upper quadrant pain. The pain is RUQ without radiation, and is dull and pressure. It started 2 months ago, is 3/10 in intensity, and has been unchanged  since it started. It is made worse if anything touches it, and relieved by nothing. The pain is associated with mild gas, and patient denies nausea, vomiting,  diarrhea, constipation, fever and chest pain.     Method  ======     Transabdominal ultrasound with image documentation. Suboptimal view: restricted by patient size.     Liver  ====     enlarged, normal shape and contour, smooth surface, fatty infiltration.  Common hepatic duct 3.0 mm. Common bile duct 4.4 mm.     Gallbladder  =========     Normal appearing gallbladder, negative sonographic Murphy's sign.  Wall thickness 2.3 mm.     Bile Ducts  ========     normal intra- and extrahepatic bile ducts.  Common hepatic duct 3.0 mm. Common bile duct 4.4 mm.     Pancreas  ========     normal appearing head and body, Tail not well seen.     Right Kidney  ==========     Normal size and morphology, no hydronephrosis.  Length 11.47 cm.     Peritoneum / Ascites  ================     There is a 4.4 cm abdominal wall defect seen superior/right of the umbilicus which corresponds with the patient's area of pain. It appears to contain fat and does  not reduce with valsalva.     Impression  =========     1. Patient marked for liver biopsy. appears enlarged and slightly echogenic which is consistent with fatty  infiltration.  2. Ventral abdominal defect noted just superior and right to the umbilicus which is consistent with an abdominal hernia.  ATTENDING RADIOLOGIST AND PAGER NUMBER  [id number] Tilden Dome MD  [phone]        Past Medical History:   Past Medical History:   Diagnosis Date   . Acne    . Anxiety state, unspecified    . Arthritis    . Cataract    . Chronic back pain    . Chronic knee pain    . Depression, major, recurrent ([place])    . Heart murmur    . Hernia    . Obesity       Patient Active Problem List   Diagnosis   . Depression, major, recurrent ([place])   . Acne   . Severe obesity   . Chronic knee pain   . Chronic back pain   . Senile nuclear cataract   . Myopia with astigmatism and presbyopia   . Mental developmental delay   . Body mass index 50.0-59.9, adult ([place])   . Morbid obesity with BMI of 50.0-59.9, adult ([place])   . Hyperlipidemia   . Systolic murmur   . Decubitus ulcer         Past Surgical History:   Past Surgical History:   Procedure Laterality Date   . LIG/TRNSXJ FLP TUBE ABDL/VAG APPR UNI/BI          Family and Social History:   family history includes Cancer in her mother; Colorectal Cancer in her maternal grandmother; Hypertension in her maternal grandfather. There is no history of Uterine Cancer, Prolapse, or Incontinence.   Social History   Substance Use Topics   . Smoking status: Never Smoker   . Smokeless tobacco: Never Used   . Alcohol use No         Active Meds:   Current Outpatient Prescriptions   Medication Sig Dispense Refill   . Acetaminophen 500 MG Oral Tab Take 1 tablet (500 mg) by mouth every 6 hours as needed for pain. (Patient taking differently: Take 500 mg by mouth every 6 hours as needed for pain. ) 60 tablet 0   . Atorvastatin Calcium 10 MG Oral Tab take 1 tablet by mouth once daily 90 tablet 0   . Clotrimazole 1 % External Cream Apply to affected area on abdomen 2 times a day. For fungal rash. 45 g 2   . FLUoxetine HCl 20 MG Oral Cap Take 3 capsules (60 mg) by mouth  daily. 270 capsule 0   . Ibuprofen 600 MG Oral Tab Take 1 tablet (600 mg) by mouth every 6 hours as needed for pain. Take with food and plenty of water. (Patient taking differently: Take 600 mg by mouth every  6 hours as needed for pain. Take with food and plenty of water.) 90 tablet 0   . Nitrofurantoin Monohyd Macro (MACROBID) 100 MG Oral Cap Take 1 capsule (100 mg) by mouth every 12 hours. 14 capsule 0   . Nystatin 100000 UNIT/GM External Cream Apply to rash underneat breasts 2 times a day 30 g 5   . Nystatin 100000 UNIT/GM External Powder Apply underneath abdomen twice daily after Clotrimazole application 60 g 2   . Oxybutynin Chloride 5 MG Oral Tab Take 1 tablet (5 mg) by mouth 2 times a day. Take up to 3 times a day 90 tablet 12     No current facility-administered medications for this visit.          Allergies:   Review of patient's allergies indicates:  No Known Allergies     A complete 14 system ROS was completed by the patient and reviewed by me today in clinic and recorded in the chart our medical team assistant. Please see today's note for full details.      PHYSICAL EXAMINATION:   BP 147/73   Pulse 77   Temp 96.1 F (35.6 C) (Temporal)   Ht 5' 4.25" (1.632 m)   Wt (!) 335 lb 8 oz (152.2 kg)   SpO2 98%   BMI 57.14 kg/m   On examination the patient appears well.   Normal affect and appropriate questions/concerns.   HEENT: poor dentition . No schleral icterus,  HEART: No cyanosis, clubbing.  CHEST: Normal respiratory exertion, no auditory wheezing    ABDOMEN: Examination reveals no surgical incisions. Fullness in the epigastric region slightly tender to palpation consistent with US findings of hernia. Morbid obesity limited exam. Moist yeast around umbilicus and pannus   EXTREMITIES: without cyanosis or edemabilaterally.   Radial pulses are 2+ bilaterally.   NEURO: Alert and oriented x 3. Pupils equal no scleral icterus.  Full range of extraocular movement.   Cranial nerves II - XII grossly intact  Muscle strength is equal and grossly normal   Coordination is good and gait is normal.       ASSESSMENT:  Holly Blair is a 51 year old female with morbid obesity and a minimally symptomatic epigastric hernia.  We discussed the pathophysiology of the disease including review of pertinent anatomy and a discussion of progression and treatment options including medical and surgical therapy. We discussed the signs and symptoms of incarceration/strangulation including worsening pain, redness at the site, nausea, vomiting, fevers and need to go directly to the ED if these happen to be assessed for possible need for emergent operation. Given her weight, an elective repair has a nearly definite chance of recurrence and I would not consider an elective repair at this time. However, we did discuss the potential option for bariatric surgery (and potentially closing defect primarily at that time) and she would like to meet with the bariatric team again.  We put in a request for bariatric surgical consultation today.          PLAN:   1. We discussed options for treatment today including observation and surgery and given her weight and risk of recurrence will not pursue surgical intervention at this time.  Warning s/s were discussed and need for ED vs surgical clinic follow up   2. Bariatric surgical consultation   3. Follow-up plan: as needed

## 2016-11-03 NOTE — Progress Notes (Signed)
REVIEW OF SYSTEMS:  Review of Systems includes the following responses to our health assessment questionnaire.  Have you ever had anesthesia problems? No  Family history of anesthesia problems? No     ANY CURRENT PROBLEMS WITH YOUR HEALTH? ADDITIONAL INFORMATION   GENERAL Recent Weight Gain/Loss  Fatigue/Trouble Sleeping  Fever/Chills/Night Sweats   NO  NO  NO   Current   Ht & Wt:  Ht 5' 4.252" (1.632 m)  Wt 335 lb 8 oz (152.182 kg)  Body mass index is 57.14 kg/m.   EAR/NOSE/MOUTH/  THROAT Hearing Loss/Hearing Aid  Ear Problems  Nose Problems  Mouth or Throat Problems  Nose bleeds/Sinus Problems  Dental Problems/Dentures  Loose or Missing Tooth/Teeth NO  NO  NO  NO  NO  NO  NO    EYE Wear glasses/contacts  Eye Problems  Yellowing of white part of eyes YES  YES  NO    NEUROLOGY Problems with vision  Headaches/Dizziness  Seizures  Fainting/Unconsciousness  Numbness/Tingling/Weakness YES  NO  NO  NO  NO    HEART Chest Pain  Heart Murmur  High Blood Pressure  Recent Heart Attack/MI  Artificial Heart Valve(s)  Able to walk two flights of stairs NO  NO  NO  NO  NO  NO    LUNG Shortness of breath (day or night)  Asthma  Sleep Apnea/Snoring  Difficulty sleeping  Lung problems  Recent cold or cough NO    NO  YES  NO  NO  NO    SKIN Masses/Bumps/Lumps  Rashes  Lesions/Cuts/Scrapes  Wounds/Blisters NO  NO  NO  NO    STOMACH/  GASTROINTESINAL/COLON/RECTUM Stomach/Abdominal Pain  Hiatal hernia  Heartburn/Indigestion  Nausea/Vomiting  Diarrhea  Constipation  Blood in Stool  Jaundice/Yellowing of skin  Hepatitis  NO  NO  NO  NO  NO  NO  NO  NO  NO                 Type:    MUSCLES/BONES       Joint Pain  Back Pain/Disc Disease  Sprain/Strain  Stiffness/Arthritis  Artificial joint(s)  Other physical disability NO  NO  NO  NO  NO  NO Location:           Type:    URINARY TRACT        Female/Female Issues  REPRODUCTION   Urinary Problems  Pain with urination  Kidney Problems/Kidney Stones    Female/Female Specific Problems  Females-Could  you be pregnant? YES  NO  NO      NO    NO    BLOOD/LYMPH Bleeding Problems  Anemia  Swollen or enlarged glands NO  NO  NO    IMMUNOLOGICAL Hay Fever  Allergies  HIV/Aids NO  YES  NO    ENDOCRINE Heat/Cold Intolerance  Hyperthyroid/Hypothyroid  Increased thirst/Diabetes NO  NO  NO    MENTAL HEALTH Anxiety/Depression  Psychiatric Care  Other Concerns YES  YES  NO

## 2016-11-10 ENCOUNTER — Telehealth (HOSPITAL_BASED_OUTPATIENT_CLINIC_OR_DEPARTMENT_OTHER): Payer: Self-pay | Admitting: Naturopath

## 2016-11-10 DIAGNOSIS — F419 Anxiety disorder, unspecified: Secondary | ICD-10-CM

## 2016-11-10 DIAGNOSIS — B372 Candidiasis of skin and nail: Secondary | ICD-10-CM

## 2016-11-10 MED ORDER — FLUOXETINE HCL 20 MG OR CAPS
60.0000 mg | ORAL_CAPSULE | Freq: Every day | ORAL | 0 refills | Status: DC
Start: 2016-11-10 — End: 2017-02-09

## 2016-11-10 MED ORDER — NYSTATIN 100000 UNIT/GM EX CREA
TOPICAL_CREAM | CUTANEOUS | 0 refills | Status: DC
Start: 2016-11-10 — End: 2017-03-05

## 2016-11-10 NOTE — Telephone Encounter (Signed)
Patient was asked to RTC per 08/30/16 Pt E-mail encounter  Pt canceled 08/31/16 & 09/03/16 appointments. No pending appointment  One refill authorized.  Please schedule follow up visit

## 2016-11-10 NOTE — Telephone Encounter (Signed)
(  TEXTING IS AN OPTION FOR [facility] CLINICS ONLY)  Is this a [facility] clinic? No      RETURN CALL: General message OK      SUBJECT:  Refill Request    MEDICATION(S): Fluoxeitine and Nystatin  NEEDED BY: normal turn-around time  PRESCRIBING PROVIDER: [name]  PHARMACY NAME AND LOCATION: [facility] on [address] in [place] PHONE: [phone]  PHARMACY FAX NUMBER: [phone]  ADDITIONAL INFORMATION: na

## 2017-01-26 ENCOUNTER — Telehealth (HOSPITAL_BASED_OUTPATIENT_CLINIC_OR_DEPARTMENT_OTHER): Payer: Self-pay | Admitting: Naturopath

## 2017-01-26 NOTE — Telephone Encounter (Signed)
Left vm to confirm if aug 15th at 9am ok

## 2017-01-27 ENCOUNTER — Other Ambulatory Visit (HOSPITAL_BASED_OUTPATIENT_CLINIC_OR_DEPARTMENT_OTHER): Payer: Self-pay | Admitting: Naturopath

## 2017-01-27 DIAGNOSIS — E7800 Pure hypercholesterolemia, unspecified: Secondary | ICD-10-CM

## 2017-01-28 MED ORDER — ATORVASTATIN CALCIUM 10 MG OR TABS
ORAL_TABLET | ORAL | 1 refills | Status: DC
Start: 2017-01-28 — End: 2017-02-09

## 2017-02-02 ENCOUNTER — Encounter (HOSPITAL_BASED_OUTPATIENT_CLINIC_OR_DEPARTMENT_OTHER): Payer: Medicare HMO | Admitting: Naturopath

## 2017-02-09 ENCOUNTER — Encounter (HOSPITAL_BASED_OUTPATIENT_CLINIC_OR_DEPARTMENT_OTHER): Payer: Self-pay | Admitting: Naturopath

## 2017-02-09 ENCOUNTER — Ambulatory Visit: Payer: Medicare HMO | Attending: Naturopath | Admitting: Naturopath

## 2017-02-09 VITALS — BP 138/82 | HR 60 | Wt 340.8 lb

## 2017-02-09 DIAGNOSIS — F331 Major depressive disorder, recurrent, moderate: Secondary | ICD-10-CM | POA: Insufficient documentation

## 2017-02-09 DIAGNOSIS — F419 Anxiety disorder, unspecified: Secondary | ICD-10-CM | POA: Insufficient documentation

## 2017-02-09 DIAGNOSIS — Z6841 Body Mass Index (BMI) 40.0 and over, adult: Secondary | ICD-10-CM

## 2017-02-09 DIAGNOSIS — E7800 Pure hypercholesterolemia, unspecified: Secondary | ICD-10-CM | POA: Insufficient documentation

## 2017-02-09 LAB — COMPREHENSIVE METABOLIC PANEL
ALT (GPT): 19 U/L (ref 7–33)
AST (GOT): 15 U/L (ref 9–38)
Albumin: 4 g/dL (ref 3.5–5.2)
Alkaline Phosphatase (Total): 96 U/L (ref 34–121)
Anion Gap: 9 (ref 4–12)
Bilirubin (Total): 0.6 mg/dL (ref 0.2–1.3)
Calcium: 9.2 mg/dL (ref 8.9–10.2)
Carbon Dioxide, Total: 31 meq/L (ref 22–32)
Chloride: 101 meq/L (ref 98–108)
Creatinine: 0.66 mg/dL (ref 0.38–1.02)
GFR, Calc, African American: >60 mL/min/{1.73_m2} (ref >59)
GFR, Calc, European American: >60 mL/min/{1.73_m2} (ref >59)
Glucose: 99 mg/dL (ref 62–125)
Potassium: 4.3 meq/L (ref 3.6–5.2)
Protein (Total): 7.4 g/dL (ref 6.0–8.2)
Sodium: 141 meq/L (ref 135–145)
Urea Nitrogen: 13 mg/dL (ref 8–21)

## 2017-02-09 LAB — CBC, DIFF
% Basophils: 0 %
% Eosinophils: 1 %
% Immature Granulocytes: 0 %
% Lymphocytes: 37 %
% Monocytes: 1 %
% Neutrophils: 61 %
Absolute Eosinophil Count: 0.11 10*3/uL (ref 0.00–0.50)
Absolute Lymphocyte Count: 4.22 10*3/uL (ref 1.00–4.80)
Basophils: 0 10*3/uL (ref 0.00–0.20)
Hematocrit: 36 % (ref 36–45)
Hemoglobin: 10.9 g/dL — ABNORMAL LOW (ref 11.5–15.5)
Immature Granulocytes: 0 10*3/uL (ref 0.00–0.05)
MCH: 26.2 pg — ABNORMAL LOW (ref 27.3–33.6)
MCHC: 30 g/dL — ABNORMAL LOW (ref 32.2–36.5)
MCV: 87 fL (ref 81–98)
Monocytes: 0.11 10*3/uL (ref 0.00–0.80)
Neutrophils: 6.96 10*3/uL (ref 1.80–7.00)
Platelet Count: 283 10*3/uL (ref 150–400)
RBC: 4.16 10*6/uL (ref 3.80–5.00)
RDW-CV: 15.9 % — ABNORMAL HIGH (ref 11.6–14.4)
WBC: 11.4 10*3/uL — ABNORMAL HIGH (ref 4.3–10.0)

## 2017-02-09 LAB — LIPID PANEL
Cholesterol (LDL): 76 mg/dL (ref <130)
Cholesterol/HDL Ratio: 2.9
HDL Cholesterol: 52 mg/dL (ref >39)
Non-HDL Cholesterol: 99 mg/dL (ref 0–159)
Total Cholesterol: 151 mg/dL (ref <200)
Triglyceride: 114 mg/dL (ref <150)

## 2017-02-09 LAB — THYROID STIMULATING HORMONE: Thyroid Stimulating Hormone: 2.44 u[IU]/mL (ref 0.400–5.000)

## 2017-02-09 LAB — HEMOGLOBIN A1C, RAPID: Hemoglobin A1C: 6.1 % — ABNORMAL HIGH (ref 4.0–6.0)

## 2017-02-09 MED ORDER — FLUOXETINE HCL 20 MG OR CAPS
60.0000 mg | ORAL_CAPSULE | Freq: Every day | ORAL | 0 refills | Status: DC
Start: 2017-02-09 — End: 2017-08-11

## 2017-02-09 MED ORDER — ATORVASTATIN CALCIUM 10 MG OR TABS
10.0000 mg | ORAL_TABLET | Freq: Every day | ORAL | 1 refills | Status: DC
Start: 2017-02-09 — End: 2017-12-06

## 2017-02-09 MED ORDER — BUPROPION HCL ER (SR) 100 MG OR TB12
100.0000 mg | EXTENDED_RELEASE_TABLET | Freq: Every day | ORAL | 4 refills | Status: DC
Start: 2017-02-09 — End: 2017-04-05

## 2017-02-09 NOTE — Patient Instructions (Signed)
Start Wellbutrin once daily and in two weeks if no side effects increase to twice daily    Follow-up one month. Or sooner if you have side effects with the medication,    Prior to their first visit at the weight loss clinic, all patients interested in surgery will be asked to watch the online surgical seminar available at:     [email or web address]

## 2017-02-09 NOTE — Progress Notes (Signed)
SUBJECTIVE:   Holly Blair is a 51 year old female patient who presents with the following concern:     Chief Complaint: medication refills    She is here today for medication refills.  She needs a refill of fluoxetine and Atorvastatin.  She has been having some more depression.  She has been living in a shelter for 7 months and has been struggling to find permanent housing.  She also does not have a regular job and has been looking for part-time work.  She is currently taking 60 mg fluoxetine daily and would be open to adding and a second medication.    She was seen by general surgery and did not have surgical hernia repair because of recurrence.  She was given a referral for bariatric surgery.  She is not scheduled the appointment because she was supposed to watch a video and did not know how to access.  She is still interested in meeting with them.     Review of Systems   Constitution: Positive for weakness and malaise/fatigue.   Cardiovascular: Negative for chest pain.   Respiratory: Negative for shortness of breath and wheezing.    Psychiatric/Behavioral: Positive for depression. Negative for substance abuse and suicidal ideas. The patient is nervous/anxious.        Patient Active Problem List   Diagnosis   . Depression, major, recurrent ([place])   . Acne   . Severe obesity   . Chronic knee pain   . Chronic back pain   . Senile nuclear cataract   . Myopia with astigmatism and presbyopia   . Mental developmental delay   . Body mass index 50.0-59.9, adult ([place])   . Morbid obesity with BMI of 50.0-59.9, adult ([place])   . Hyperlipidemia   . Systolic murmur   . Decubitus ulcer       Current Outpatient Prescriptions   Medication Sig Dispense Refill   . Acetaminophen 500 MG Oral Tab Take 1 tablet (500 mg) by mouth every 6 hours as needed for pain. (Patient taking differently: Take 500 mg by mouth every 6 hours as needed for pain. ) 60 tablet 0   . Atorvastatin Calcium 10 MG Oral Tab Take 1 tablet (10 mg) by mouth daily. 90 tablet  1   . BuPROPion HCl, SR, 100 MG Oral TABLET SR 12 HR Take 1 tablet (100 mg) by mouth daily. In two weeks if no side side effects increase to twice daily 180 tablet 4   . Clotrimazole 1 % External Cream Apply to affected area on abdomen 2 times a day. For fungal rash. 45 g 2   . FLUoxetine HCl 20 MG Oral Cap Take 3 capsules (60 mg) by mouth daily. - Please schedule an appointment with [name] 270 capsule 0   . Ibuprofen 600 MG Oral Tab Take 1 tablet (600 mg) by mouth every 6 hours as needed for pain. Take with food and plenty of water. (Patient taking differently: Take 600 mg by mouth every 6 hours as needed for pain. Take with food and plenty of water.) 90 tablet 0   . Nystatin 100000 UNIT/GM External Cream Apply to rash underneat breasts 2 times a day - Please schedule an appointment with Jamori Biggar 30 g 0   . Oxybutynin Chloride 5 MG Oral Tab Take 1 tablet (5 mg) by mouth 2 times a day. Take up to 3 times a day 90 tablet 12     No current facility-administered medications for this visit.  Review of patient's allergies indicates:  No Known Allergies    OBJECTIVE:  General: Alert, no acute distress   BP 138/82   Pulse 60   Wt (!) 340 lb 12.8 oz (154.6 kg)   BMI 58.04 kg/m   General appearance: healthy, alert, no distress  Mood/affect: Pleasant female. Mood and affect appropriate.    ASSESSMENT/PLAN:  Tillie was seen today for follow-up .    Diagnoses and all orders for this visit:    Moderate episode of recurrent major depressive disorder (HCC)  -     BuPROPion HCl, SR, 100 MG Oral TABLET SR 12 HR; Take 1 tablet (100 mg) by mouth daily. In two weeks if no side side effects increase to twice daily    Morbid obesity ([place])  -     REFERRAL TO WEIGHT LOSS [facility]MT CENTER (link given access video)  -     CBC, DIFF  -     COMPREHENSIVE METABOLIC PANEL  -     LIPID PANEL  -     HEMOGLOBIN A1C, RAPID  -     VITAMIN D (25 HYDROXY)  -     THYROID STIMULATING HORMONE    Anxiety  -     FLUoxetine HCl 20 MG Oral Cap; Take  3 capsules (60 mg) by mouth daily. - Please schedule an appointment with [name]  -     BuPROPion HCl, SR, 100 MG Oral TABLET SR 12 HR; Take 1 tablet (100 mg) by mouth daily. In two weeks if no side side effects increase to twice daily    Hypercholesterolemia  -     Atorvastatin Calcium 10 MG Oral Tab; Take 1 tablet (10 mg) by mouth daily.      Patient Instructions   Start Wellbutrin once daily and in two weeks if no side effects increase to twice daily    Follow-up one month. Or sooner if you have side effects with the medication,    Prior to their first visit at the weight loss clinic, all patients interested in surgery will be asked to watch the online surgical seminar available at:     [email or web address]

## 2017-02-14 LAB — VITAMIN D (25 HYDROXY)
Vit D (25_Hydroxy) Total: 21.3 ng/mL (ref 20.1–50.0)
Vitamin D2 (25_Hydroxy): <1 ng/mL
Vitamin D3 (25_Hydroxy): 21.3 ng/mL

## 2017-02-16 ENCOUNTER — Telehealth (HOSPITAL_BASED_OUTPATIENT_CLINIC_OR_DEPARTMENT_OTHER): Payer: Self-pay | Admitting: Naturopath

## 2017-02-16 NOTE — Telephone Encounter (Addendum)
-----   Message from [place], [place] sent at 02/16/2017  1:39 PM PDT -----  Please call patient and have her schedule an appointment to discuss lab results specifically blood sugar screening.       Appt scheduled for 9/19.

## 2017-03-05 ENCOUNTER — Other Ambulatory Visit (HOSPITAL_BASED_OUTPATIENT_CLINIC_OR_DEPARTMENT_OTHER): Payer: Self-pay | Admitting: Naturopath

## 2017-03-05 DIAGNOSIS — B372 Candidiasis of skin and nail: Secondary | ICD-10-CM

## 2017-03-07 MED ORDER — NYSTATIN 100000 UNIT/GM EX CREA
TOPICAL_CREAM | CUTANEOUS | 2 refills | Status: DC
Start: 2017-03-07 — End: 2018-03-15

## 2017-03-09 ENCOUNTER — Encounter (HOSPITAL_BASED_OUTPATIENT_CLINIC_OR_DEPARTMENT_OTHER): Payer: Medicare HMO | Admitting: Naturopath

## 2017-03-15 ENCOUNTER — Encounter (HOSPITAL_BASED_OUTPATIENT_CLINIC_OR_DEPARTMENT_OTHER): Payer: Medicare HMO | Admitting: Naturopath

## 2017-03-21 ENCOUNTER — Other Ambulatory Visit (HOSPITAL_BASED_OUTPATIENT_CLINIC_OR_DEPARTMENT_OTHER): Payer: Self-pay | Admitting: Naturopath

## 2017-03-21 DIAGNOSIS — R52 Pain, unspecified: Secondary | ICD-10-CM

## 2017-03-23 MED ORDER — IBUPROFEN 600 MG OR TABS
ORAL_TABLET | ORAL | 0 refills | Status: DC
Start: 2017-03-23 — End: 2017-12-06

## 2017-03-31 ENCOUNTER — Encounter (HOSPITAL_BASED_OUTPATIENT_CLINIC_OR_DEPARTMENT_OTHER): Payer: Medicare HMO | Admitting: Naturopath

## 2017-04-05 ENCOUNTER — Ambulatory Visit: Payer: Medicare HMO | Attending: Naturopath | Admitting: Naturopath

## 2017-04-05 VITALS — BP 139/74 | HR 59 | Wt 344.4 lb

## 2017-04-05 DIAGNOSIS — Z6841 Body Mass Index (BMI) 40.0 and over, adult: Secondary | ICD-10-CM

## 2017-04-05 DIAGNOSIS — L309 Dermatitis, unspecified: Secondary | ICD-10-CM | POA: Insufficient documentation

## 2017-04-05 DIAGNOSIS — F331 Major depressive disorder, recurrent, moderate: Secondary | ICD-10-CM | POA: Insufficient documentation

## 2017-04-05 MED ORDER — BUPROPION HCL ER (SR) 150 MG OR TB12
150.0000 mg | EXTENDED_RELEASE_TABLET | Freq: Two times a day (BID) | ORAL | 4 refills | Status: DC
Start: 2017-04-05 — End: 2018-06-22

## 2017-04-05 MED ORDER — TRIAMCINOLONE ACETONIDE 0.1 % EX CREA
TOPICAL_CREAM | Freq: Two times a day (BID) | CUTANEOUS | 3 refills | Status: DC
Start: 2017-04-05 — End: 2018-07-28

## 2017-04-05 NOTE — Progress Notes (Signed)
SUBJECTIVE:    Holly Blair is an 51 year old female who presents in follow up for depression medication    She is managing her depression with 60 mg Fluoxetine daily and 100 mg Bupropion twice daily. She started the Bupropion about 2 months ago and she has been very happy with it.  She feels like it has been helpful for mood stability and her depression has improved.  She has had a recent death in the family which has been sad but she also found out that she will get housing by the end of the week.  She is still interested in pursuing bariatric surgery as her weight continues to gradually increase despite eating less and continued walking    She currently reports the following symptoms:  Somatic: decreased appetite, loss of energy/fatigue  Affective: sadness  Psychologic: poor concentration    She denies: suicidal ideation, anxiety       ROS: positive for patch of eczema on back of right leg. It is itchy. No poor wound healing or abnormal skin lesions. No fever or chills. No recent illness.     Patient Active Problem List   Diagnosis   . Depression, major, recurrent ([place])   . Acne   . Severe obesity   . Chronic knee pain   . Chronic back pain   . Senile nuclear cataract   . Myopia with astigmatism and presbyopia   . Mental developmental delay   . Body mass index 50.0-59.9, adult ([place])   . Morbid obesity with BMI of 50.0-59.9, adult ([address])   . Hyperlipidemia   . Systolic murmur   . Decubitus ulcer   . Anxiety        Current Outpatient Prescriptions   Medication Sig Dispense Refill   . Acetaminophen 500 MG Oral Tab Take 1 tablet (500 mg) by mouth every 6 hours as needed for pain. (Patient taking differently: Take 500 mg by mouth every 6 hours as needed for pain. ) 60 tablet 0   . Atorvastatin Calcium 10 MG Oral Tab Take 1 tablet (10 mg) by mouth daily. 90 tablet 1   . BuPROPion HCl, SR, 150 MG Oral TABLET SR 12 HR Take 1 tablet (150 mg) by mouth 2 times a day. 180 tablet 4   . Clotrimazole 1 % External Cream  Apply to affected area on abdomen 2 times a day. For fungal rash. 45 g 2   . FLUoxetine HCl 20 MG Oral Cap Take 3 capsules (60 mg) by mouth daily. - Please schedule an appointment with [name] 270 capsule 0   . Ibuprofen 600 MG Oral Tab take 1 tablet by mouth every 6 hours with food AND PLENTY OF WATER if needed for pain 90 tablet 0   . Nystatin 100000 UNIT/GM External Cream Apply to rash underneath breast twice daily 30 g 2   . Oxybutynin Chloride 5 MG Oral Tab Take 1 tablet (5 mg) by mouth 2 times a day. Take up to 3 times a day 90 tablet 12   . Triamcinolone Acetonide 0.1 % External Cream Apply to affected area on leg(s) 2 times a day. 1 Tube 3     No current facility-administered medications for this visit.         OBJECTIVE:  BP 139/74   Pulse 59   Wt (!) 344 lb 6.4 oz (156.2 kg)   BMI 58.65 kg/m    Skin: eczematous patch on back of left lower leg about 3 cm  in diameter    MENTAL STATUS EXAMINATION:   MOOD: within normal range   AFFECT: normal intensity   ORIENTATION: intact to person, place and time     ASSESSMENT/ PLAN:   [name] was seen today for medication review.    Diagnoses and all orders for this visit:    Moderate episode of recurrent major depressive disorder (HCC)  -     BuPROPion HCl, SR, 150 MG Oral TABLET SR 12 HR; Take 1 tablet (150 mg) by mouth 2 times a day.    Eczema, unspecified type  -     Triamcinolone Acetonide 0.1 % External Cream; Apply to affected area on leg(s) 2 times a day.    Patient Instructions   I am increasing your medication dose to 150 mg twice daily.    You can use the the new cream on the patch on your leg.  Use it twice daily as needed.    Schedule an annual exam and pap smear    I think it would be a good idea to follow-up with the Bariatric Surgery Clinic     [email or web address]

## 2017-04-05 NOTE — Patient Instructions (Addendum)
I am increasing your medication dose to 150 mg twice daily.    You can use the the new cream on the patch on your leg.  Use it twice daily as needed.    Schedule an annual exam and pap smear    I think it would be a good idea to follow-up with the Bariatric Surgery Clinic     [email or web address]

## 2017-04-10 ENCOUNTER — Ambulatory Visit (HOSPITAL_COMMUNITY)
Admission: EM | Admit: 2017-04-10 | Discharge: 2017-04-10 | Disposition: A | Discharge status: Home / Self Care | Payer: Managed Care, Other (non HMO) | Attending: Emergency Medicine | Admitting: Emergency Medicine

## 2017-04-10 ENCOUNTER — Encounter (HOSPITAL_COMMUNITY): Payer: Self-pay | Admitting: Emergency Medicine

## 2017-04-10 DIAGNOSIS — S61412A Laceration without foreign body of left hand, initial encounter: Secondary | ICD-10-CM | POA: Diagnosis not present

## 2017-04-10 DIAGNOSIS — W260XXA Contact with knife, initial encounter: Secondary | ICD-10-CM

## 2017-04-10 DIAGNOSIS — Z23 Encounter for immunization: Secondary | ICD-10-CM

## 2017-04-10 MED ORDER — TETANUS-DIPHTH-ACELL PERTUSSIS 5-2.5-18.5 LF-MCG/0.5 IM SUSP
INTRAMUSCULAR | Status: AC
  Filled 2017-04-10: qty 0.5

## 2017-04-10 MED ORDER — TETANUS-DIPHTH-ACELL PERTUSSIS 5-2.5-18.5 LF-MCG/0.5 IM SUSP
0.5000 mL | Freq: Once | INTRAMUSCULAR | Status: AC
  Administered 2017-04-10: 0.5 mL via INTRAMUSCULAR

## 2017-04-10 NOTE — ED Provider Notes (Signed)
[place]    CSN: [id number] Arrival date & time: 04/10/17  1515     History   Chief Complaint Chief Complaint  Patient presents with  . Laceration    HPI Charlene Johnson is a 51 y.o. female.   51 year old female presents with a puncture wound to the left first interdigital webspace that occurred this afternoon around 2 PM while she was using appearing night. This produced a 3-4 mm laceration of the skin. Denies problems with function.      History reviewed. No pertinent past medical history.  There are no active problems to display for this patient.   Past Surgical History:  Procedure Laterality Date  . EYE SURGERY      OB History    No data available       Home Medications    Prior to Admission medications   Medication Sig Start Date End Date Taking? Authorizing Provider  azithromycin (ZITHROMAX Z-PAK) 250 MG tablet 2 pills today then 1 pill a day for 4 days 06/22/16   Caryn Section Linden Dolin, PA-C  benzonatate (TESSALON) 200 MG capsule Take 1 capsule (200 mg total) by mouth 3 (three) times daily as needed for cough. 06/22/16   Versie Starks, PA-C  gemfibrozil (LOPID) 600 MG tablet Take 1 pill bid 05/07/16   Caryn Section Linden Dolin, PA-C    Family History Family History  Problem Relation Age of Onset  . Heart disease Father   . Hyperlipidemia Father   . Cancer Maternal Grandmother   . Cancer Maternal Grandfather   . Stroke Paternal Grandmother   . Cancer Paternal Grandfather     Social History Social History  Substance Use Topics  . Smoking status: Never Smoker  . Smokeless tobacco: Never Used  . Alcohol use 0.0 oz/week     Allergies   Patient has no known allergies.   Review of Systems Review of Systems  Constitutional: Negative.   Skin: Positive for wound.  All other systems reviewed and are negative.    Physical Exam Triage Vital Signs ED Triage Vitals  Enc Vitals Group     BP 04/10/17 1536 (!) 161/82     Pulse Rate 04/10/17 1536 76      Resp 04/10/17 1536 18     Temp 04/10/17 1536 98.5 F (36.9 C)     Temp Source 04/10/17 1536 Oral     SpO2 04/10/17 1536 100 %     Weight --      Height --      Head Circumference --      Peak Flow --      Pain Score 04/10/17 1537 2     Pain Loc --      Pain Edu? --      Excl. in [place]? --    No data found.   Updated Vital Signs BP (!) 161/82 (BP Location: Left Arm)   Pulse 76   Temp 98.5 F (36.9 C) (Oral)   Resp 18   LMP 03/31/2017   SpO2 100%   Visual Acuity Right Eye Distance:   Left Eye Distance:   Bilateral Distance:    Right Eye Near:   Left Eye Near:    Bilateral Near:     Physical Exam  Constitutional: She is oriented to person, place, and time. She appears well-developed and well-nourished. No distress.  HENT:  Head: Normocephalic and atraumatic.  Eyes: EOM are normal.  Neck: Neck supple.  Musculoskeletal: Normal range of motion. She exhibits  no edema or deformity.  Thumb opposition normal. Extension and flexion intact. Full range of motion of the thumb and all digits. Able to make fist. No hand pain, no bony tenderness. No tenderness or pain to the wrist. No bleeding. Distal sensory is intact. Normal sensation to touch, pressure and temperature.  Lymphadenopathy:    She has no cervical adenopathy.  Neurological: She is alert and oriented to person, place, and time. No cranial nerve deficit.  Skin: Skin is warm and dry.  The stab wound produced approximately 3-4 mm superficial wound to the dermis. No foreign bodies are seen. A dermis was irrigated under force by syringe with normal saline.No active bleeding. Foreign bodies are seen or palpated.  Nursing note and vitals reviewed.    UC Treatments / Results  Labs (all labs ordered are listed, but only abnormal results are displayed) Labs Reviewed - No data to display  EKG  EKG Interpretation None       Radiology No results found.  Procedures Procedures (including critical care  time)  Medications Ordered in UC Medications  Tdap (BOOSTRIX) injection 0.5 mL (not administered)     Initial Impression / Assessment and Plan / UC Course  I have reviewed the triage vital signs and the nursing notes.  Pertinent labs & imaging results that were available during my care of the patient were reviewed by me and considered in my medical decision making (see chart for details). We discussed the possible treatments. The patient settled on not having a stitch. We applied glue after extensive cleaning and irrigation and the patient would prefer to wear a dressing such that she will not be able to fully extend the thumb/webspace.   Try to not separate the wound if possible over the next 3-4 days. You may get the wound wet it is covered with a waterproof glue. Watch for signs of infection. If he seen redness, drainage, increased pain, red streaks or other problems seek medical attention promptly. A dressing was applied, nonstick as well as a bandage to help hold the thumb in a abducted position allowing flexion and extension of the IP joint. This will keep the thumb is a 2 way that it will not separate wounds of the wound in the webspace. Patient is happy with this particular arrangement/bandage.   Final Clinical Impressions(s) / UC Diagnoses   Final diagnoses:  Laceration of left hand without foreign body, initial encounter    New Prescriptions New Prescriptions   No medications on file     Controlled Substance Prescriptions [place] Controlled Substance Registry consulted? Not Applicable   Janne Napoleon, NP 04/10/17 1758

## 2017-04-10 NOTE — Discharge Instructions (Signed)
Try to not separate the wound if possible over the next 3-4 days. You may get the wound wet it is covered with a waterproof glue. Watch for signs of infection. If he seen redness, drainage, increased pain, red streaks or other problems seek medical attention promptly.

## 2017-04-10 NOTE — ED Triage Notes (Signed)
Pt reports she punctured/stabbed her right hand today around 1505 w/a paring knife while trying to removing plastic wrapping   Bleeding controlled  Last tetanus = unknown  A&O x4... NAD... Ambulatory

## 2017-04-10 NOTE — ED Notes (Signed)
Patient endured a puncture wound in between index and thumb on left hand, bleeding controlled.  Hand currently submerged in betadine saline soak.    Patient can move all fingers and thumb, increase pain when thumb is moved toward hand.  Patient reports tingling in ring and little finger.  Notified Janne Napoleon, NP

## 2017-04-17 ENCOUNTER — Encounter (HOSPITAL_BASED_OUTPATIENT_CLINIC_OR_DEPARTMENT_OTHER): Payer: Self-pay | Admitting: Naturopath

## 2017-04-19 ENCOUNTER — Other Ambulatory Visit: Payer: Self-pay

## 2017-04-28 ENCOUNTER — Ambulatory Visit: Payer: Medicare HMO | Attending: Naturopath | Admitting: Naturopath

## 2017-04-28 ENCOUNTER — Encounter (HOSPITAL_BASED_OUTPATIENT_CLINIC_OR_DEPARTMENT_OTHER): Payer: Self-pay | Admitting: Naturopath

## 2017-04-28 VITALS — BP 130/81 | HR 78 | Ht 64.75 in | Wt 341.0 lb

## 2017-04-28 DIAGNOSIS — Z1151 Encounter for screening for human papillomavirus (HPV): Secondary | ICD-10-CM | POA: Insufficient documentation

## 2017-04-28 DIAGNOSIS — D72829 Elevated white blood cell count, unspecified: Secondary | ICD-10-CM | POA: Insufficient documentation

## 2017-04-28 DIAGNOSIS — K921 Melena: Secondary | ICD-10-CM | POA: Insufficient documentation

## 2017-04-28 DIAGNOSIS — Z124 Encounter for screening for malignant neoplasm of cervix: Secondary | ICD-10-CM | POA: Insufficient documentation

## 2017-04-28 DIAGNOSIS — Z0000 Encounter for general adult medical examination without abnormal findings: Secondary | ICD-10-CM | POA: Insufficient documentation

## 2017-04-28 DIAGNOSIS — Z6841 Body Mass Index (BMI) 40.0 and over, adult: Secondary | ICD-10-CM

## 2017-04-28 LAB — CBC, DIFF
% Basophils: 0 %
% Eosinophils: 1 %
% Immature Granulocytes: 0 %
% Lymphocytes: 22 %
% Monocytes: 8 %
% Neutrophils: 69 %
Absolute Eosinophil Count: 0.14 10*3/uL (ref 0.00–0.50)
Absolute Lymphocyte Count: 3.02 10*3/uL (ref 1.00–4.80)
Basophils: 0 10*3/uL (ref 0.00–0.20)
Hematocrit: 35 % — ABNORMAL LOW (ref 36–45)
Hemoglobin: 11.2 g/dL — ABNORMAL LOW (ref 11.5–15.5)
Immature Granulocytes: 0 10*3/uL (ref 0.00–0.05)
MCH: 27.9 pg (ref 27.3–33.6)
MCHC: 31.8 g/dL — ABNORMAL LOW (ref 32.2–36.5)
MCV: 88 fL (ref 81–98)
Monocytes: 1.1 10*3/uL — ABNORMAL HIGH (ref 0.00–0.80)
Neutrophils: 9.45 10*3/uL — ABNORMAL HIGH (ref 1.80–7.00)
Platelet Count: 312 10*3/uL (ref 150–400)
RBC: 4.02 10*6/uL (ref 3.80–5.00)
RDW-CV: 16.2 % — ABNORMAL HIGH (ref 11.6–14.4)
WBC: 13.71 10*3/uL — ABNORMAL HIGH (ref 4.3–10.0)

## 2017-04-28 NOTE — Progress Notes (Signed)
SUBJECTIVE:    Holly Blair is a 51 year old female here today for PHV.     Other problems or concerns today:     She reports blood in her stool and bright red blood in the toilet intermittently for the last couple weeks. She has had harder stools but is still having a bm on a daily basis.     Health Maintenance   Topic Date Due   . Cervical Cancer Screening (Pap Smear)  06/27/2016   . Influenza Vaccine (1) 03/21/2017   . Depression Monitoring (PHQ-9)  04/05/2018   . Diabetes Screening  02/10/2020   . Lipid Disorders Screening  02/09/2022   . Tetanus Vaccine  06/11/2024   . HIV Screening  Addressed       Patient Active Problem List   Diagnosis   . Depression, major, recurrent ([place])   . Acne   . Severe obesity   . Chronic knee pain   . Chronic back pain   . Senile nuclear cataract   . Myopia with astigmatism and presbyopia   . Mental developmental delay   . Body mass index 50.0-59.9, adult ([place])   . Morbid obesity with BMI of 50.0-59.9, adult ([place])   . Hyperlipidemia   . Systolic murmur   . Decubitus ulcer   . Anxiety       Past Medical History:   Diagnosis Date   . Acne    . Anxiety state, unspecified    . Arthritis    . Cataract    . Chronic back pain    . Chronic knee pain    . Depression, major, recurrent ([place])    . Heart murmur    . Hernia    . Obesity        Past Surgical History:   Procedure Laterality Date   . LIG/TRNSXJ FLP TUBE ABDL/VAG APPR UNI/BI         Current Outpatient Prescriptions   Medication Sig Dispense Refill   . Acetaminophen 500 MG Oral Tab Take 1 tablet (500 mg) by mouth every 6 hours as needed for pain. (Patient taking differently: Take 500 mg by mouth every 6 hours as needed for pain. ) 60 tablet 0   . Atorvastatin Calcium 10 MG Oral Tab Take 1 tablet (10 mg) by mouth daily. 90 tablet 1   . BuPROPion HCl, SR, 150 MG Oral TABLET SR 12 HR Take 1 tablet (150 mg) by mouth 2 times a day. 180 tablet 4   . Clotrimazole 1 % External Cream Apply to affected area on abdomen 2 times a day. For  fungal rash. 45 g 2   . FLUoxetine HCl 20 MG Oral Cap Take 3 capsules (60 mg) by mouth daily. - Please schedule an appointment with [name] 270 capsule 0   . Ibuprofen 600 MG Oral Tab take 1 tablet by mouth every 6 hours with food AND PLENTY OF WATER if needed for pain 90 tablet 0   . Nystatin 100000 UNIT/GM External Cream Apply to rash underneath breast twice daily 30 g 2   . Oxybutynin Chloride 5 MG Oral Tab Take 1 tablet (5 mg) by mouth 2 times a day. Take up to 3 times a day 90 tablet 12   . Triamcinolone Acetonide 0.1 % External Cream Apply to affected area on leg(s) 2 times a day. 1 Tube 3     No current facility-administered medications for this visit.        Review  of patient's allergies indicates:  No Known Allergies      SOCIAL HISTORY  Makaylah Oddo recently got an apartment in [facility].  She is not working.      FAMILY HISTORY  Family History     Problem (# of Occurrences) Relation (Name,Age of Onset)    Cancer (1) Mother: lung cancer, long history of smkoing    Colorectal Cancer (1) Maternal Grandmother    Hypertension (1) Maternal Grandfather       Negative family history of: Uterine Cancer, Prolapse, Incontinence          GYN HISTORY  Obstetric History    G4   P4   T4   P0   A0   L0     SAB0   TAB0   Ectopic0   Multiple0   Live Births0      LMP: 03/31/2017   Menses: menses regular every 28-32 days  Last pap: 2013  Pap history: no history of abnormal paps  Other gyn history: none    SEXUAL HISTORY  Sexual activity: not at present  History of STD: none known  Sexual concerns: No    NUTRITION  Current dietary habits: gets food at food bank and picks the healthiest food as possible, lots of vegetable    EXERCISE  Current exercise habits: water aerobics twice weekly     HABITS  Smoker: no  Alcohol: 1-2 times monthly   Recreational Drug Use: no    SAFETY  History of sexual or physical abuse: No  Safe in current living space: YES  Regular seat belt use: YES  Stress: No    ROS:  All other  systems reviewed and are negative other than what's listed in HPI     OBJECTIVE:  BP 130/81   Pulse 78   Ht 5' 4.75" (1.645 m)   Wt (!) 341 lb (154.7 kg)   LMP 03/31/2017 (Within Days)   SpO2 98%   BMI 57.18 kg/m   Body mass index is 57.18 kg/m.  General appearance: healthy, alert, no distress  Mood/affect: Pleasant female. Mood and affect appropriate.  Skin: Skin color, texture, turgor normal. No rashes or concerning lesions  HEENT:Ear:  External ears normal. Canals clear. TM's normal. and Throat:  oropharynx w/o lesions, erythema, exudate; MMM and no tonsillar enlargement  Neck: supple. No adenopathy. Thyroid symmetric, normal size, without nodules  Respiratory: Respirations even and unlabored. Clear lung fields without crackles, wheezing or expiratory prolongation  Cardiovascular: S1 and S2 present. RRR without murmurs.  Abdomen: soft, non-tender. BS normal. No masses or organomegaly  Breasts: No obvious deformity or mass to inspection, nipples everted bilaterally, no skin lesion or nipple discharge, no mass palpated, no axillary lymphadenopathy  Pelvic: External genitalia normal, normal bartholin/skene/urethral meatus/anus., no CMT, Cervix not visualized, blind pap smear collected       ASSESSMENT/PLAN:    Mayleen was seen today for wellness and medication management.    Diagnoses and all orders for this visit:    Annual physical exam  Health Care Maintenance:  Screening Labs: CBC (glucose, lipids, TSH done this year)  Pap smear: blindly collected today  STD Check: patient declined  Mammogram done 2018  Additional Recommendations: Miralax for constipation     Preventive counseling:   health maintenance  STD prevention   Immunizations   Dietary Counseling   vitamin D supplementation  proper exercise   Follow-up: 1 year    Leukocytosis, unspecified type  -  CBC, DIFF    Hematochezia  -     REFERRAL TO GASTROENTEROLOGY-CLINIC  She will also be due for first Colonoscopy

## 2017-04-28 NOTE — Patient Instructions (Addendum)
I recommend stating Miralax.  You can first use it for 3-5 days and stop.  If constipation continues you can use it daily.     We live in [place] and don't see the sunshine nearly enough. I recommend Vitamin D supplementation for all my patients (especially October-June) for prevention of heart disease, osteoporosis, auto immune disease, diabetes, and for mood stability.   I generally recommend 2000 IU Vitamin D3 daily.     Leading a Healthy Life- Six tips to help improve your health and wellness   Eat well to give your body the energy it needs.   Stay or get active.   A healthy mind is part of a healthy body.   Practice safe living habits.   Keep your mind and body clean.   Get regular health care.     Tip #1: Eat well to give your body the energy it needs   Your body needs nutritious foods to stay strong and healthy. Here are some general eating guidelines:   . Eat real food (not processed, packaged, and already made)  . Eat adequate vegetables and fruits (6 servings daily). These foods contain invaluable micronutrients, phytochemicals, antioxidants and fiber.   . Choose whole grains over simple carbs. (brown rice over white rice)  . Eat more fiber (legumes, fresh fruit, ground flax seeds) Minimum daily fiber intake should be 30 grams!!!  . Eat healthy fats (olive oil, nuts, avocados, essential fatty acids) and avoid trans fats and high saturated fats.  . Eat adequate protein (0.5 g/lb body weight daily) from lean sources including: poultry, eggs, legumes, tofu, dairy products, nuts and seeds.   . Eat calcium rich foods (milk, cheese, yogurt, kale legumes, tofu, figs, sardines) 2-3 times daily.  Calcium is best absorbed through food.    . Eliminate the intake of refined sugar and refined carbohydrates.   . Eat healthy snacks with protein source.  Marland Kitchen Keep salt intake low. Less than 1500 mg daily.  . Drink adequate water--generally 64 ounces daily.  . Avoid sugary drinks (juice and soda)  . Minimal caffeine intake:  no more than one caffeinated drink daily is recommended.    Tip #2: Stay or get active   Exercise for at least 30 minutes at a time, at least 4 times a week. Regular physical activity can help you:   . Live longer and feel better   . Be stronger and more flexible  . Build strong bones  . Prevent depression and anxiety  . Manage stress  . Strengthen your immune system  . Maintain a healthy body weight  . Improve brain function    Tip #3: Remember: A healthy mind is part of a healthy body   A good state of mind can help you make healthy choices. Here are a few tips for keeping your mind healthy:   . Reduce stress in your life!  Stress is one of the main causes of illness.  . Make some time every day for things that are fun and bring you joy.  . Move!  Exercise is a great way to reduce stress.  . Get enough sleep.  Lack of sleep reduces how well you can concentrate, it increases mood swings, and increases stress hormone production in the body.  It has a ripple effect and pretty much effects everything!   . Ask your health care provider for help if you feel depressed or anxious for more than several days in a row.  Tip #4: Practice safe living habits   Accidents and Injuries   Accidents and injuries are the 5th leading cause of death in the [place]   Women under age 51 are more likely to die in motor vehicle accidents than from any other cause.   Accidents in the home cause thousands of permanent injuries every year. The most common accidents are fires, falls, and drowning. To help yourself and your family stay safe:   Marland Kitchen Install smoke detectors on each floor of your home and know how to use them.  . Stay safe on the road: wear a seatbelt, do not ride with someone who has been drinking or taking drugs, avoid cell phone use while driving, wear a helmet when riding a bicycle or motorcycle  Hand Hygiene   Protect yourself from germs by washing your hands often!     Tip #5: Keep your mind and body clean   . Smoking is the  single worst thing you can do for your health.  It doubles your risk for developing both heart disease and cancer.  . Minimal alcohol intake: no more than one drink a night is recommended.    Tip #6: Get regular health care   Many people think they need to see their provider only when they are sick. But, health care providers can also help you stay healthy.   . Find a health care provider who works with you to manage your health.  This is a team game.  . Ask your health care provider if you have questions.  There is no such thing as a stupid question-only a question not asked.  . Don't save your concerns for your annual check-up.  Come in as they occur.  . Smile more-it makes you feel good!

## 2017-05-04 LAB — HPV ONLY
Cytologic Impression: NEGATIVE
HPV Presence: NEGATIVE

## 2017-05-04 LAB — CERVICAL CANCER SCREENING
Cytologic Impression: NEGATIVE
LAB AP HISTORIC HPV PRESENCE: NEGATIVE

## 2017-05-31 ENCOUNTER — Encounter (INDEPENDENT_AMBULATORY_CARE_PROVIDER_SITE_OTHER): Payer: Medicare HMO | Admitting: Adult Health

## 2017-06-01 ENCOUNTER — Ambulatory Visit: Payer: Self-pay

## 2017-06-01 DIAGNOSIS — Z299 Encounter for prophylactic measures, unspecified: Secondary | ICD-10-CM

## 2017-06-01 NOTE — Progress Notes (Unsigned)
Patient came in to have her yearly lab draw for testing.

## 2017-06-02 LAB — CMP12+LP+TP+TSH+6AC+CBC/D/PLT
A/G RATIO: 1.6 (ref 1.2–2.2)
ALBUMIN: 4.2 g/dL (ref 3.5–5.5)
ALT: 24 IU/L (ref 0–32)
AST: 24 IU/L (ref 0–40)
Alkaline Phosphatase: 100 IU/L (ref 39–117)
BASOS ABS: 0 10*3/uL (ref 0.0–0.2)
BUN/Creatinine Ratio: 17 (ref 9–23)
BUN: 10 mg/dL (ref 6–24)
Basos: 0 %
Bilirubin Total: 0.3 mg/dL (ref 0.0–1.2)
CHOLESTEROL TOTAL: 230 mg/dL — AB (ref 100–199)
CREATININE: 0.6 mg/dL (ref 0.57–1.00)
Calcium: 9.3 mg/dL (ref 8.7–10.2)
Chloride: 101 mmol/L (ref 96–106)
Chol/HDL Ratio: 6.2 ratio — ABNORMAL HIGH (ref 0.0–4.4)
EOS (ABSOLUTE): 0.3 10*3/uL (ref 0.0–0.4)
Eos: 4 %
Estimated CHD Risk: 1.8 times avg. — ABNORMAL HIGH (ref 0.0–1.0)
Free Thyroxine Index: 1.6 (ref 1.2–4.9)
GFR calc Af Amer: 124 mL/min/{1.73_m2} (ref >59)
GFR, EST NON AFRICAN AMERICAN: 107 mL/min/{1.73_m2} (ref >59)
GGT: 19 IU/L (ref 0–60)
GLOBULIN, TOTAL: 2.7 g/dL (ref 1.5–4.5)
Glucose: 95 mg/dL (ref 65–99)
HDL: 37 mg/dL — AB (ref >39)
Hematocrit: 38 % (ref 34.0–46.6)
Hemoglobin: 12.7 g/dL (ref 11.1–15.9)
IMMATURE GRANS (ABS): 0 10*3/uL (ref 0.0–0.1)
IMMATURE GRANULOCYTES: 0 %
IRON: 66 ug/dL (ref 27–159)
LDH: 202 IU/L (ref 119–226)
LYMPHS: 35 %
Lymphocytes Absolute: 2.8 10*3/uL (ref 0.7–3.1)
MCH: 26.3 pg — ABNORMAL LOW (ref 26.6–33.0)
MCHC: 33.4 g/dL (ref 31.5–35.7)
MCV: 79 fL (ref 79–97)
MONOCYTES: 8 %
MONOS ABS: 0.6 10*3/uL (ref 0.1–0.9)
NEUTROS ABS: 4.2 10*3/uL (ref 1.4–7.0)
Neutrophils: 53 %
PHOSPHORUS: 3.3 mg/dL (ref 2.5–4.5)
PLATELETS: 256 10*3/uL (ref 150–379)
Potassium: 4.9 mmol/L (ref 3.5–5.2)
RBC: 4.82 x10E6/uL (ref 3.77–5.28)
RDW: 15.6 % — AB (ref 12.3–15.4)
Sodium: 138 mmol/L (ref 134–144)
T3 UPTAKE RATIO: 22 % — AB (ref 24–39)
T4 TOTAL: 7.2 ug/dL (ref 4.5–12.0)
TOTAL PROTEIN: 6.9 g/dL (ref 6.0–8.5)
TRIGLYCERIDES: 481 mg/dL — AB (ref 0–149)
TSH: 1.61 u[IU]/mL (ref 0.450–4.500)
Uric Acid: 5.6 mg/dL (ref 2.5–7.1)
WBC: 7.9 10*3/uL (ref 3.4–10.8)

## 2017-06-02 LAB — VITAMIN D 25 HYDROXY (VIT D DEFICIENCY, FRACTURES): VIT D 25 HYDROXY: 13.7 ng/mL — AB (ref 30.0–100.0)

## 2017-06-07 ENCOUNTER — Ambulatory Visit: Payer: Medicare HMO | Attending: Naturopath | Admitting: Adult Health

## 2017-06-07 ENCOUNTER — Encounter (INDEPENDENT_AMBULATORY_CARE_PROVIDER_SITE_OTHER): Payer: Self-pay | Admitting: Adult Health

## 2017-06-07 ENCOUNTER — Telehealth (HOSPITAL_BASED_OUTPATIENT_CLINIC_OR_DEPARTMENT_OTHER): Payer: Self-pay | Admitting: Internal Medicine

## 2017-06-07 VITALS — BP 153/80 | HR 72 | Wt 345.0 lb

## 2017-06-07 DIAGNOSIS — K5903 Drug induced constipation: Secondary | ICD-10-CM

## 2017-06-07 DIAGNOSIS — Z6841 Body Mass Index (BMI) 40.0 and over, adult: Secondary | ICD-10-CM

## 2017-06-07 DIAGNOSIS — K625 Hemorrhage of anus and rectum: Secondary | ICD-10-CM

## 2017-06-07 MED ORDER — PEG 3350-KCL-NABCB-NACL-NASULF 236 G OR SOLR
ORAL | 0 refills | Status: DC
Start: 2017-06-07 — End: 2018-07-28

## 2017-06-07 NOTE — Progress Notes (Signed)
INITIAL GASTROENTEROLOGY CLINIC NOTE    DATE OF SERVICE: 06/07/2017   REFERRING CLINICIAN: [name] [place], ARNP  REASON FOR REFERRAL:  Patient is seen in consultation at the request of Eldred Manges, ARNP for evaluation of bright red blood on tissue intermittently for past 6 wks.Marland Kitchen    HISTORY OF PRESENT ILLNESS:  Holly Blair is a 50 year old female     Stool: Frequency: 1-2 times daily; Bristol Stool Form Scale: 2;  Blood: bright red blood on tissue; Melena: no; Mucus: no;  Fecal incontinence; no;  Nighttime stools: no    Bloating: no  Gas: no    GERD  Heartburn: no    Swallowing  Problems swallowing: no    Abdominal pain: no    Fever/chills: no  Weight changes: no  Anemia: mild, still getting period    RELEVANT STUDIES:  CBC, mild anemia 11/18    CURRENT AND PRIOR THERAPIES:  none    PAST MEDICAL HISTORY:  Past Medical History:   Diagnosis Date   . Acne    . Anxiety state, unspecified    . Arthritis    . Cataract    . Chronic back pain    . Chronic knee pain    . Depression, major, recurrent ([place])    . Heart murmur    . Hernia    . Obesity        PAST SURGICAL HISTORY:  Past Surgical History:   Procedure Laterality Date   . LIG/TRNSXJ FLP TUBE ABDL/VAG APPR UNI/BI         ALLERGIES:  Review of patient's allergies indicates:  No Known Allergies    MEDS:  Current Outpatient Prescriptions   Medication Sig Dispense Refill   . Acetaminophen 500 MG Oral Tab Take 1 tablet (500 mg) by mouth every 6 hours as needed for pain. (Patient taking differently: Take 500 mg by mouth every 6 hours as needed for pain. ) 60 tablet 0   . Atorvastatin Calcium 10 MG Oral Tab Take 1 tablet (10 mg) by mouth daily. 90 tablet 1   . BuPROPion HCl, SR, 150 MG Oral TABLET SR 12 HR Take 1 tablet (150 mg) by mouth 2 times a day. 180 tablet 4   . Clotrimazole 1 % External Cream Apply to affected area on abdomen 2 times a day. For fungal rash. 45 g 2   . FLUoxetine HCl 20 MG Oral Cap Take 3 capsules (60 mg) by mouth daily. - Please  schedule an appointment with [name] 270 capsule 0   . Ibuprofen 600 MG Oral Tab take 1 tablet by mouth every 6 hours with food AND PLENTY OF WATER if needed for pain 90 tablet 0   . Nystatin 100000 UNIT/GM External Cream Apply to rash underneath breast twice daily 30 g 2   . Oxybutynin Chloride 5 MG Oral Tab Take 1 tablet (5 mg) by mouth 2 times a day. Take up to 3 times a day 90 tablet 12   . Triamcinolone Acetonide 0.1 % External Cream Apply to affected area on leg(s) 2 times a day. 1 Tube 3     No current facility-administered medications for this visit.        FAMILY HISTORY:  Family History     Problem (# of Occurrences) Relation (Name,Age of Onset)    Cancer (1) Mother: lung cancer, long history of smkoing    Colorectal Cancer (1) Maternal Grandmother    Hypertension (1) Maternal Grandfather  Negative family history of: Uterine Cancer, Prolapse, Incontinence        Gastric Cancer: no  Colorectal Cancer: grandmother died of colon ca at 34 y/o  Pancreatic Cancer: no  Inflammatory Bowel Disease: no  Celiac Disease: no    SOCIAL HISTORY:  Lives alone  Occupation - advocate for people with intellectual disabilities      I personally reviewed and confirmed the ROS, past medical history, past surgical history, family history and social history in the record with the patient today.      PHYSICAL EXAMINATION:  BP 153/80   Pulse 72   Wt (!) 345 lb (156.5 kg)   SpO2 94%   BMI 57.86 kg/m    Wt Readings from Last 5 Encounters:   06/07/17 (!) 345 lb (156.5 kg)   04/28/17 (!) 341 lb (154.7 kg)   04/05/17 (!) 344 lb 6.4 oz (156.2 kg)   02/09/17 (!) 340 lb 12.8 oz (154.6 kg)   11/03/16 (!) 335 lb 8 oz (152.2 kg)     GEN: healthy, alert, no distress  HEENT: non icteric, no dysphagia     NECK: no cervical LAD, supple  CHEST: Clear lung fields without crackles, wheezing or expiratory prolongation  Cardiovascular: regular rate and rhythm  Gastrointestinal: soft, normoactive bowel sounds, nontender, no guarding,  nondistended  no hernias present  no hepatosplenomegaly  SKIN: no rashes, normal color  MSK: normal tone and bulk  PSYCH: affect appropriate      ASSESSMENT/PLAN:  1. Rectal bleeding  Intermittent bright red blood on tissue for 6 weeks, history of hard to pass stools  DD: hemorrhoid, mass, IBD  - REFERRAL TO GASTROENTEROLOGY-PROCEDURE  - PEG 3350-KCl-NaBcb-NaCl-NaSulf 236 g Oral Recon Soln; Use as instructed for colonoscopy prep  Dispense: 4000 mL; Refill: 0    2. Drug-induced constipation  Mild, BM 1-2 times daily, BSFS 2  Miralax daily    Return in about 4 weeks (around 07/05/2017), or if symptoms worsen or fail to improve.    See AVS for patient education.    After Visit Summary given to patient.      Meade Maw, [place] [facility]  Division of Gastroenterology

## 2017-06-07 NOTE — Patient Instructions (Addendum)
Take Polyethylene glycol (Miralax) 1 capful (17 grams) daily.        Colonoscopy     A camera attached to a flexible tube with a viewing lens is used to take video pictures.     Colonoscopyis a test to view the inside of your lower digestive tract (colon and rectum).Sometimes it can show the last part of the small intestine (ileum).During the test, small pieces of tissue may be removed for testing. This is called a biopsy. Small growths, such as polyps, may also be removed.  Why is colonoscopy done?  The test is done to help look for colon cancer. And it can help find the source of abdominal pain,bleeding,and changes in bowel habits. It may be needed once a year, depending on factors such as your:   Age   Health history   Family health history   Symptoms   Results from any prior colonoscopy  Risks and possible complications  These include:   Bleeding   A puncture or tear in the colon   Risks of anesthesia   A cancer lesion not being seen  Getting ready  To prepare for the test:   Talk with your healthcare provider about the risks of the test (see below). Also ask your healthcare provider about alternatives to the test.   Tell your healthcare provider about any medicines you take. Alsotell him or her about any health conditions you may have.   Make sure your rectum and colon are empty for the test. Follow the diet and bowel prep instructions exactly. If you don't, the test may need to be rescheduled.   Plan for a friend or family member to drive you home after the test.     Colonoscopy provides an inside view of the entire colon.     You may discuss the results with your doctor right away or at a future visit.  During the test  The test is usually done in the hospital on an outpatient basis. This means you go home the same day. The procedure takes about 63minutes. During that time:   You are given relaxing (sedating) medicine through an IV line.You may be drowsy, or fully  asleep.   The healthcare provider will first give you a physical exam to check for anal andrectal problems.   Then the anus is lubricated and the scope inserted.   If you are awake, you may have a feeling similar to needing to have a bowelmovement. You may also feel pressure as air is pumped into the colon. It'sOK to pass gas during the procedure.   Biopsy, polyp removal, or other treatments may be done during the test.  After the test  You may have gas right after the test. It can help to try to pass it to help prevent later bloating. Your healthcare provider may discuss the results with you right away. Or you may need to schedule a follow-up visit to talk about the results. After the test, you can go back to your normal eating andother activities. You may be tired from the sedation and need to rest for a few hours.  Date Last Reviewed: 04/22/2015   2000-2017 [employer]. [address] [place] [address]. All rights reserved. This information is not intended as a substitute for professional medical care. Always follow your healthcare professional's instructions.    Meade Maw, [place] [facility]  Division of Gastroenterology

## 2017-06-07 NOTE — Telephone Encounter (Signed)
Sharepoint Assessment Questions    Patient Reported Vitals     Estimated body mass index is 57.86 kg/m as calculated from the following:    Height as of 04/28/17: 5' 4.75" (1.645 m).    Weight as of an earlier encounter on 06/07/17: 345 lb (156.5 kg).    General Questions:   1. Does the patient have sufficient understanding of English?    Yes     2. Is the patient able to provide consent?    Yes                                                                                                                               Patient Assessment:   Has the patient already been flagged to be scheduled with Anesthesia?   Yes     Have you had a previous Endoscopy?     No        Are you taking any Anti-coagulant or Anti-platelet Medications?     No                                                                                               Do you have any bleeding or coagulation disorders?   No     Are you diabetic?    No      Are you on dialysis?   No     Do you have diagnosed Sleep Apnea?   No      Do you have a Pacemaker or Defibrillator?   No      Are you a difficult IV start?   No     Is the patient a female and younger than 49 years old?   Yes      Are you pregnant or planning to be pregnant by the time of your procedure?     No - Advise patient to call us back if they do become pregnant before their procedure date       Are you wheelchair bound or have any mobility issues that make it difficult to get onto a stretcher?   No      Is the patient scheduled for a Colonoscopy?    Yes      Do you have less than 2 bowel movements a week or are wheelchair bound?     No       Is the patient scheduled for an ERCP   No     Is this patient scheduled for a PEG?    No        Procedure  Scheduling:   1. Procedure Type -  Colonoscopy      2. Procedure MD - Zisman      3. Procedure Date - 07/22/17       4. Procedure Time - 11am       5. Procedure Check-In Time - 10am        Patient Teaching:     1. Who received the teaching ?  Patient       2. Was the topic of stopping iron supplements taught? Yes     3. Was the topic of blood thinners taught? Yes     4. Were instructions for taking current medications taught? Yes     5. Was out transportation policy taught? Yes     6. What is the name of the driver? friend        7. How was the preparation instruction materials delivered?    Verbal and email     8. Does this procedure require bowel prep?    Yes -      1. Were the instructions for the ordered laxatives taught? yes       2. Which Rx was prescribed ?     Golytely

## 2017-07-04 ENCOUNTER — Telehealth: Payer: Self-pay

## 2017-07-04 ENCOUNTER — Telehealth (HOSPITAL_BASED_OUTPATIENT_CLINIC_OR_DEPARTMENT_OTHER): Payer: Self-pay | Admitting: Internal Medicine

## 2017-07-04 NOTE — Telephone Encounter (Signed)
(  TEXTING IS AN OPTION FOR [facility] ONLY)  Is this a [place] clinic? No    RETURN CALL: No call back needed    SUBJECT:  Cancellation/Reschedule Request     ORIGINAL APPOINTMENT DATE: 07/22/17, TIME: 10AM  REASON: Transportation issues    UNABLE TO APPOINT BECAUSE: Patient is requesting to cancel her colonoscopy procedure due to transportation issues. Patient will call back to reschedule at a later time.

## 2017-07-04 NOTE — Telephone Encounter (Signed)
Copied from [place] [id number]. Topic: Appointment Scheduling - New Patient >> Jul 04, 2017 10:51 AM Ether Griffins B wrote: New patient has been scheduled for your office. Provider: Aundra Dubin Date of Appointment: 08/05/17  Route to department's PEC pool.

## 2017-07-04 NOTE — Telephone Encounter (Signed)
NP packet mailed

## 2017-07-05 NOTE — Telephone Encounter (Signed)
Procedure has been cancelled.

## 2017-07-06 ENCOUNTER — Ambulatory Visit: Payer: Managed Care, Other (non HMO) | Admitting: Cardiovascular Disease

## 2017-07-06 ENCOUNTER — Encounter: Payer: Self-pay | Admitting: Cardiovascular Disease

## 2017-07-06 DIAGNOSIS — R0609 Other forms of dyspnea: Secondary | ICD-10-CM | POA: Insufficient documentation

## 2017-07-06 DIAGNOSIS — Z8249 Family history of ischemic heart disease and other diseases of the circulatory system: Secondary | ICD-10-CM

## 2017-07-06 DIAGNOSIS — I208 Other forms of angina pectoris: Secondary | ICD-10-CM | POA: Diagnosis not present

## 2017-07-06 DIAGNOSIS — R002 Palpitations: Secondary | ICD-10-CM

## 2017-07-06 DIAGNOSIS — E781 Pure hyperglyceridemia: Secondary | ICD-10-CM | POA: Diagnosis not present

## 2017-07-06 DIAGNOSIS — R079 Chest pain, unspecified: Secondary | ICD-10-CM | POA: Insufficient documentation

## 2017-07-06 NOTE — Patient Instructions (Signed)
Medication Instructions: Your physician recommends that you continue on your current medications as directed. Please refer to the Current Medication list given to you today.  Testing/Procedures: Your physician has requested that you have an exercise stress myoview. For further information please visit [email or web address]. Please follow instruction sheet, as given.  Your physician has requested that you have an echocardiogram. Echocardiography is a painless test that uses sound waves to create images of your heart. It provides your doctor with information about the size and shape of your heart and how well your heart's chambers and valves are working. This procedure takes approximately one hour. There are no restrictions for this procedure.  Your physician has recommended that you wear a 14 day event monitor. Event monitors are medical devices that record the heart's electrical activity. Doctors most often [place] these monitors to diagnose arrhythmias. Arrhythmias are problems with the speed or rhythm of the heartbeat. The monitor is a small, portable device. You can wear one while you do your normal daily activities. This is usually used to diagnose what is causing palpitations/syncope (passing out).  Follow-Up: Your physician recommends that you schedule a follow-up appointment in: 1 month with Dr. Gwenlyn Found.

## 2017-07-06 NOTE — Progress Notes (Signed)
07/06/2017 [name]   [birth year]  [id number]  Primary Physician System, Provider Not In Primary Cardiologist: Lorretta Harp MD Lupe Carney, [place]  HPI:  Charlene Johnson is a 52 y.o. 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 She self-referred for cardiovascular evaluation because of new onset chest pain and shortness of breath as well as palpitations. Her only risk factors include hypercholesterolemia on medication and family history the father who had stents and bypass grafting. She's never had a heart attack or stroke. She has had a stress test remotely 20 years ago which she said was evaluated symptoms probably related to stress. Her last 4-5 months she's noticed chest pressure occurring every other day lasting hours at a time mostly at night associated with shortness of breath and palpitations.    No outpatient medications have been marked as taking for the 07/06/17 encounter (Office Visit) with Lorretta Harp, MD.     No Known Allergies  Social History   Socioeconomic History  . Marital status: Single    Spouse name: Not on file  . Number of children: Not on file  . Years of education: Not on file  . Highest education level: Not on file  Social Needs  . Financial resource strain: Not on file  . Food insecurity - worry: Not on file  . Food insecurity - inability: Not on file  . Transportation needs - medical: Not on file  . Transportation needs - non-medical: Not on file  Occupational History  . Not on file  Tobacco Use  . Smoking status: Never Smoker  . Smokeless tobacco: Never Used  Substance and Sexual Activity  . Alcohol use: Yes    Alcohol/week: 0.0 oz  . Drug use: No  . Sexual activity: Not on file  Other Topics Concern  . Not on file  Social History Narrative  . Not on file     Review of Systems: General: negative  for chills, fever, night sweats or weight changes.  Cardiovascular: negative for chest pain, dyspnea on exertion, edema, orthopnea, palpitations, paroxysmal nocturnal dyspnea or shortness of breath Dermatological: negative for rash Respiratory: negative for cough or wheezing Urologic: negative for hematuria Abdominal: negative for nausea, vomiting, diarrhea, bright red blood per rectum, melena, or hematemesis Neurologic: negative for visual changes, syncope, or dizziness All other systems reviewed and are otherwise negative except as noted above.    Blood pressure 138/70, pulse 83, height 5\' 3"  (1.6 m), weight 186 lb (84.4 kg).  General appearance: alert and no distress Neck: no adenopathy, no carotid bruit, no JVD, supple, symmetrical, trachea midline and thyroid not enlarged, symmetric, no tenderness/mass/nodules Lungs: clear to auscultation bilaterally Heart: regular rate and rhythm, S1, S2 normal, no murmur, click, rub or gallop Extremities: extremities normal, atraumatic, no cyanosis or edema Pulses: 2+ and symmetric Skin: Skin color, texture, turgor normal. No rashes or lesions Neurologic: Alert and oriented X 3, normal strength and tone. Normal symmetric reflexes. Normal coordination and gait  EKG sinus rhythm at 83 without ST or T-wave changes. I Personally reviewed this EKG.  ASSESSMENT AND PLAN:   Family history of heart disease Father had stents and bypass grafting  Hypertriglyceridemia History of hypertriglyceridemia on fenofibrate with triglycerides measured 06/01/17 of 481.  Chest pain Mr. has complained of chest pressure less 4-5 months occurring every other day lasting hours at time with radiation  to her jaw and arm associated with shortness of breath. She has risk factors including hypercholesterolemia and family history. I am going to get an exercise Myoview stress test to further evaluate.  Dyspnea on exertion History of dyspnea on exertion without prior smoking  history. I'm going to get a 2-D echo to further evaluate.      Lorretta Harp MD FACP,FACC,FAHA, [facility] 07/06/2017 12:08 PM

## 2017-07-06 NOTE — Assessment & Plan Note (Signed)
Father had stents and bypass grafting

## 2017-07-06 NOTE — Assessment & Plan Note (Signed)
Mr. has complained of chest pressure less 4-5 months occurring every other day lasting hours at time with radiation to her jaw and arm associated with shortness of breath. She has risk factors including hypercholesterolemia and family history. I am going to get an exercise Myoview stress test to further evaluate.

## 2017-07-06 NOTE — Assessment & Plan Note (Signed)
History of dyspnea on exertion without prior smoking history. I'm going to get a 2-D echo to further evaluate.

## 2017-07-06 NOTE — Assessment & Plan Note (Signed)
History of hypertriglyceridemia on fenofibrate with triglycerides measured 06/01/17 of 481.

## 2017-07-18 ENCOUNTER — Other Ambulatory Visit: Payer: Self-pay

## 2017-07-18 ENCOUNTER — Ambulatory Visit (HOSPITAL_COMMUNITY): Payer: Managed Care, Other (non HMO) | Attending: Cardiology

## 2017-07-18 ENCOUNTER — Ambulatory Visit (INDEPENDENT_AMBULATORY_CARE_PROVIDER_SITE_OTHER): Payer: Managed Care, Other (non HMO)

## 2017-07-18 DIAGNOSIS — R002 Palpitations: Secondary | ICD-10-CM

## 2017-07-18 DIAGNOSIS — I208 Other forms of angina pectoris: Secondary | ICD-10-CM

## 2017-07-18 DIAGNOSIS — R0600 Dyspnea, unspecified: Secondary | ICD-10-CM | POA: Diagnosis not present

## 2017-07-18 DIAGNOSIS — R0609 Other forms of dyspnea: Secondary | ICD-10-CM | POA: Insufficient documentation

## 2017-07-20 ENCOUNTER — Telehealth (HOSPITAL_COMMUNITY): Payer: Self-pay

## 2017-07-20 NOTE — Telephone Encounter (Signed)
Encounter complete. 

## 2017-07-22 ENCOUNTER — Ambulatory Visit (HOSPITAL_COMMUNITY)
Admission: RE | Admit: 2017-07-22 | Discharge: 2017-07-22 | Disposition: A | Discharge status: Home / Self Care | Payer: Managed Care, Other (non HMO) | Source: Ambulatory Visit | Attending: Cardiology | Admitting: Cardiology

## 2017-07-22 ENCOUNTER — Encounter (HOSPITAL_BASED_OUTPATIENT_CLINIC_OR_DEPARTMENT_OTHER): Payer: Medicare HMO | Admitting: Internal Medicine

## 2017-07-22 ENCOUNTER — Ambulatory Visit (HOSPITAL_COMMUNITY): Admission: RE | Admit: 2017-07-22 | Payer: Medicare HMO | Source: Home / Self Care | Admitting: Internal Medicine

## 2017-07-22 DIAGNOSIS — I2089 Other forms of angina pectoris: Secondary | ICD-10-CM

## 2017-07-22 DIAGNOSIS — I208 Other forms of angina pectoris: Secondary | ICD-10-CM | POA: Diagnosis not present

## 2017-07-22 DIAGNOSIS — R002 Palpitations: Secondary | ICD-10-CM

## 2017-07-22 DIAGNOSIS — R0609 Other forms of dyspnea: Secondary | ICD-10-CM | POA: Diagnosis not present

## 2017-07-22 LAB — MYOCARDIAL PERFUSION IMAGING
CHL CUP RESTING HR STRESS: 70 {beats}/min
CSEPED: 7 min
CSEPHR: 97 %
CSEPPHR: 166 {beats}/min
Estimated workload: 8.5 METS
Exercise duration (sec): 0 s
LV dias vol: 77 mL (ref 46–106)
LV sys vol: 26 mL
MPHR: 171 {beats}/min
RPE: 18
SDS: 0
SRS: 0
SSS: 0
TID: 1.04

## 2017-07-22 MED ORDER — TECHNETIUM TC 99M TETROFOSMIN IV KIT
32.1000 | PACK | Freq: Once | INTRAVENOUS | Status: AC | PRN
  Administered 2017-07-22: 32.1 via INTRAVENOUS
  Filled 2017-07-22: qty 33

## 2017-07-22 MED ORDER — TECHNETIUM TC 99M TETROFOSMIN IV KIT
10.2000 | PACK | Freq: Once | INTRAVENOUS | Status: AC | PRN
  Administered 2017-07-22: 10.2 via INTRAVENOUS
  Filled 2017-07-22: qty 11

## 2017-07-22 MED ORDER — REGADENOSON 0.4 MG/5ML IV SOLN: 0.4000 mg | Freq: Once | INTRAVENOUS | Status: DC

## 2017-07-26 ENCOUNTER — Encounter (HOSPITAL_COMMUNITY): Payer: Self-pay | Admitting: *Deleted

## 2017-07-27 ENCOUNTER — Encounter (HOSPITAL_COMMUNITY): Payer: Self-pay | Admitting: *Deleted

## 2017-08-03 ENCOUNTER — Other Ambulatory Visit: Payer: Self-pay | Admitting: Cardiovascular Disease

## 2017-08-03 DIAGNOSIS — K7689 Other specified diseases of liver: Secondary | ICD-10-CM

## 2017-08-05 ENCOUNTER — Ambulatory Visit: Payer: Self-pay | Admitting: Internal Medicine

## 2017-08-05 ENCOUNTER — Encounter (INDEPENDENT_AMBULATORY_CARE_PROVIDER_SITE_OTHER): Payer: Medicare HMO | Admitting: Adult Health

## 2017-08-08 ENCOUNTER — Telehealth: Payer: Self-pay | Admitting: Cardiovascular Disease

## 2017-08-08 DIAGNOSIS — K7689 Other specified diseases of liver: Secondary | ICD-10-CM

## 2017-08-08 NOTE — Telephone Encounter (Signed)
Charlene Johnson calling with [facility] Radiology,  Charlene Johnson states that order for Ultrasound needs to be changed to Ultrasound Abdomen Upper Right Quadrant per Ultrasound department. Charlene Johnson states that she is available at [phone] if there any questions.  Patient is scheduled for tomorrow 08-09-17

## 2017-08-08 NOTE — Telephone Encounter (Signed)
Spoke with susan, order changed.

## 2017-08-09 ENCOUNTER — Ambulatory Visit: Payer: Managed Care, Other (non HMO) | Admitting: Cardiovascular Disease

## 2017-08-09 ENCOUNTER — Ambulatory Visit (HOSPITAL_COMMUNITY)
Admission: RE | Admit: 2017-08-09 | Discharge: 2017-08-09 | Disposition: A | Discharge status: Home / Self Care | Payer: Managed Care, Other (non HMO) | Source: Ambulatory Visit | Attending: Cardiovascular Disease | Admitting: Cardiovascular Disease

## 2017-08-09 ENCOUNTER — Ambulatory Visit (HOSPITAL_COMMUNITY): Payer: Managed Care, Other (non HMO)

## 2017-08-09 DIAGNOSIS — K7689 Other specified diseases of liver: Secondary | ICD-10-CM

## 2017-08-11 ENCOUNTER — Other Ambulatory Visit (HOSPITAL_BASED_OUTPATIENT_CLINIC_OR_DEPARTMENT_OTHER): Payer: Self-pay | Admitting: Naturopath

## 2017-08-11 DIAGNOSIS — F419 Anxiety disorder, unspecified: Secondary | ICD-10-CM

## 2017-08-12 ENCOUNTER — Encounter: Payer: Self-pay | Admitting: Cardiovascular Disease

## 2017-08-12 ENCOUNTER — Ambulatory Visit (HOSPITAL_COMMUNITY)
Admission: RE | Admit: 2017-08-12 | Discharge: 2017-08-12 | Disposition: A | Discharge status: Home / Self Care | Payer: Managed Care, Other (non HMO) | Source: Ambulatory Visit | Attending: Cardiovascular Disease | Admitting: Cardiovascular Disease

## 2017-08-12 ENCOUNTER — Ambulatory Visit: Payer: Managed Care, Other (non HMO) | Admitting: Cardiovascular Disease

## 2017-08-12 DIAGNOSIS — K7689 Other specified diseases of liver: Secondary | ICD-10-CM | POA: Insufficient documentation

## 2017-08-12 DIAGNOSIS — R0609 Other forms of dyspnea: Secondary | ICD-10-CM | POA: Diagnosis not present

## 2017-08-12 DIAGNOSIS — R002 Palpitations: Secondary | ICD-10-CM | POA: Diagnosis not present

## 2017-08-12 DIAGNOSIS — I208 Other forms of angina pectoris: Secondary | ICD-10-CM | POA: Diagnosis not present

## 2017-08-12 DIAGNOSIS — K760 Fatty (change of) liver, not elsewhere classified: Secondary | ICD-10-CM | POA: Diagnosis not present

## 2017-08-12 IMAGING — US US ABDOMEN LIMITED
1 series · 14 of 25 positions shown · non-contrast
Comparison: None.

CLINICAL DATA: Liver cyst

EXAM:
ULTRASOUND ABDOMEN LIMITED RIGHT UPPER QUADRANT

[Series 1: us abdomen limited · 0.20mm/px · 14 of 41 slices shown]
[im 1/41]
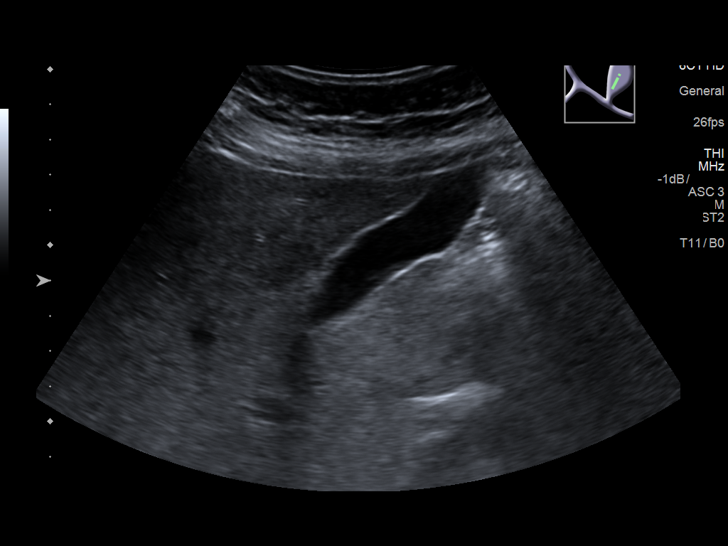
[im 4/41]
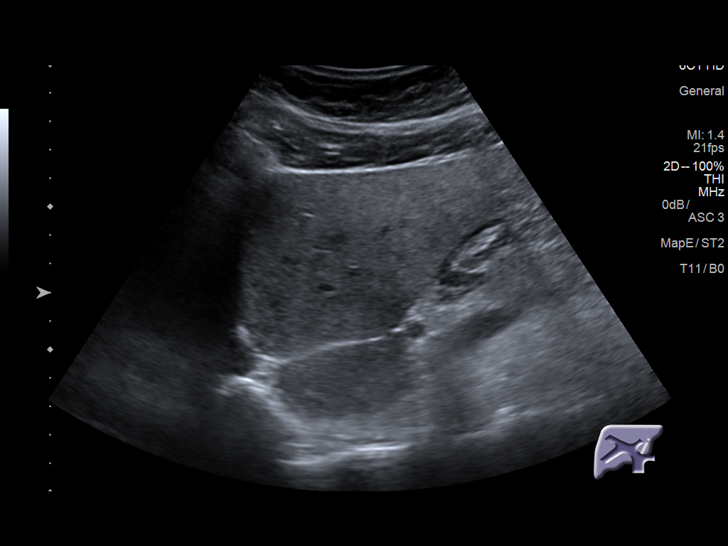
[im 7/41]
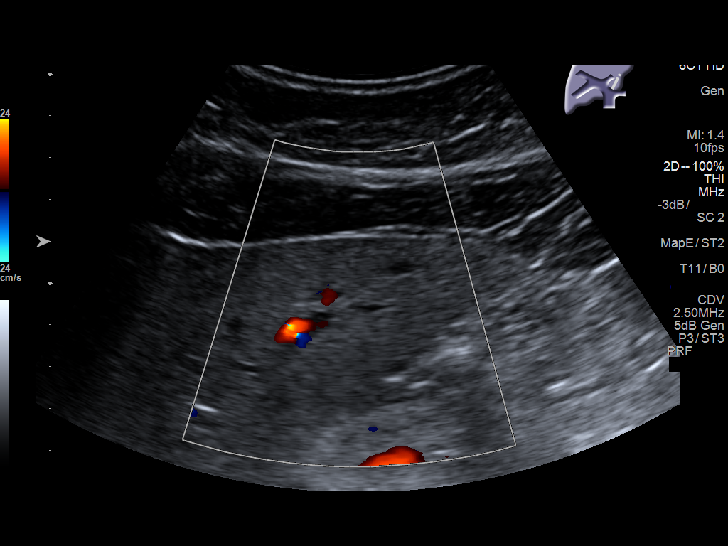
[im 11/41]
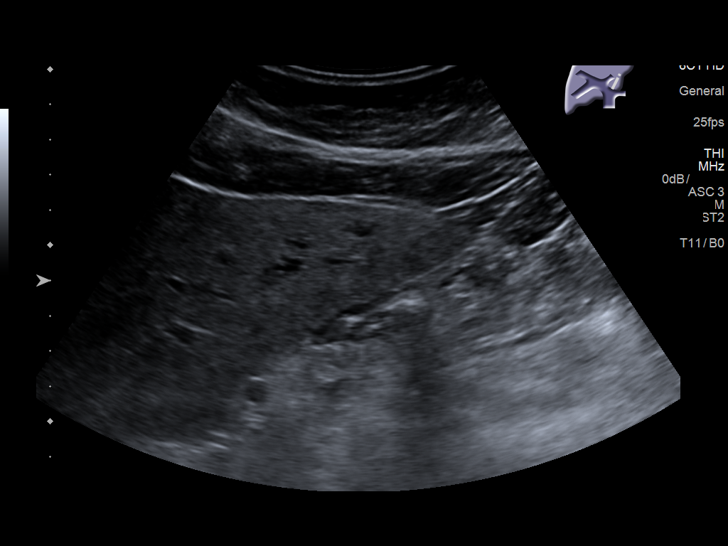
[im 14/41]
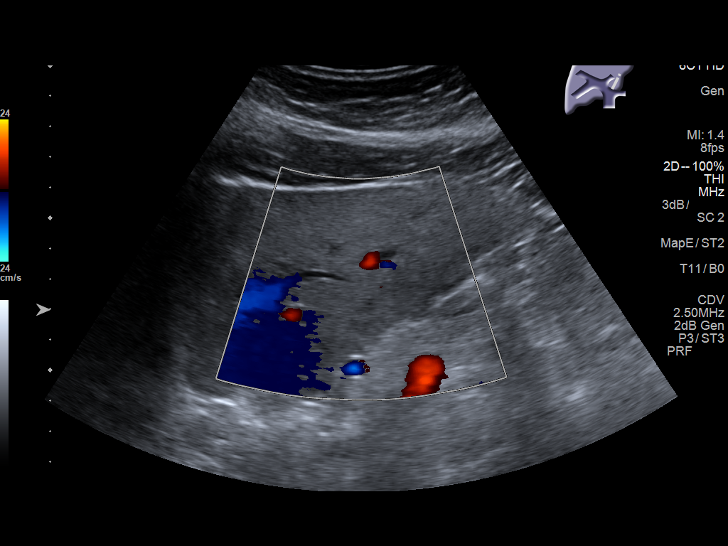
[im 16/41]
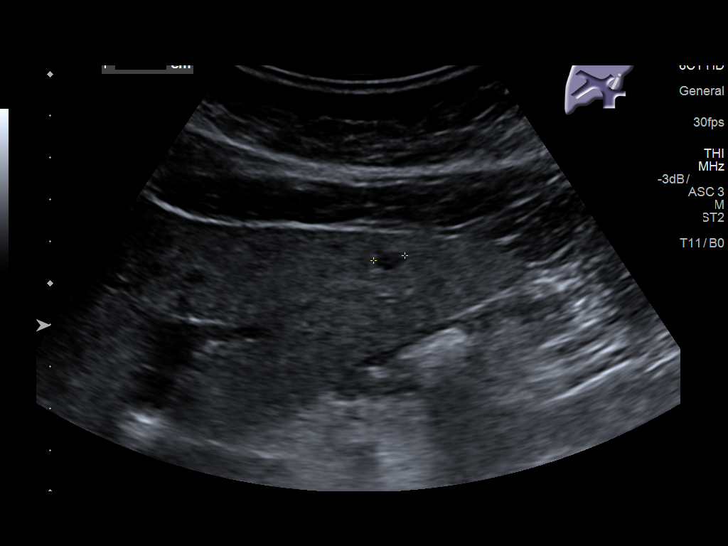
[im 19/41]
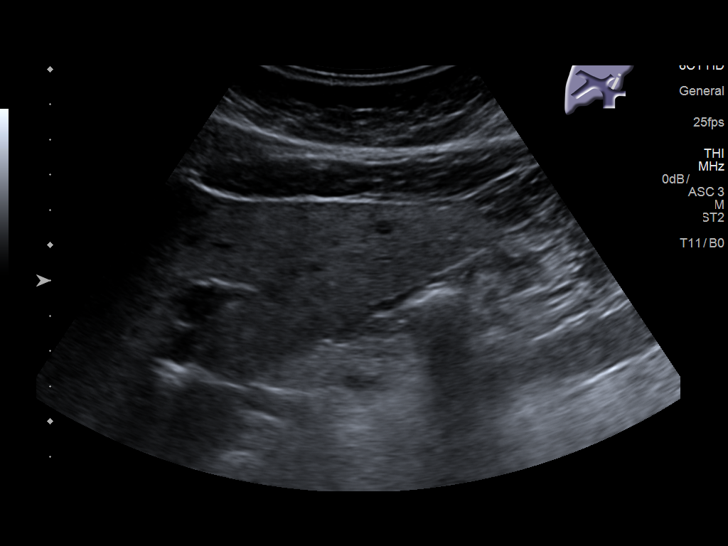
[im 22/41]
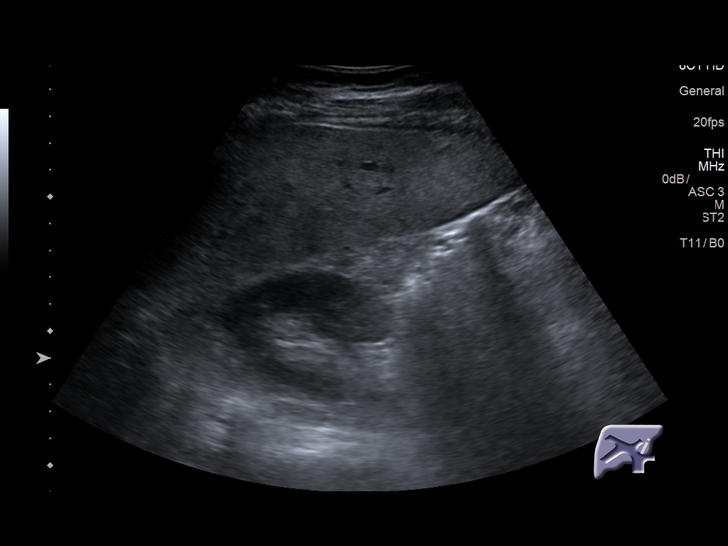
[im 26/41]
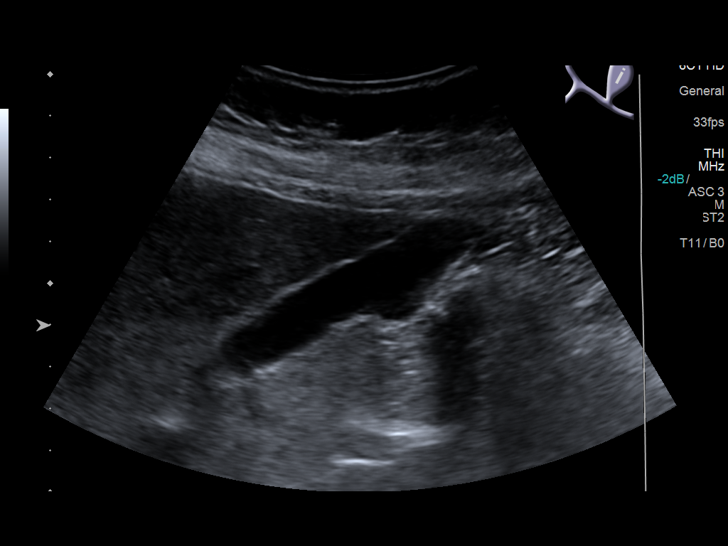
[im 27/41]
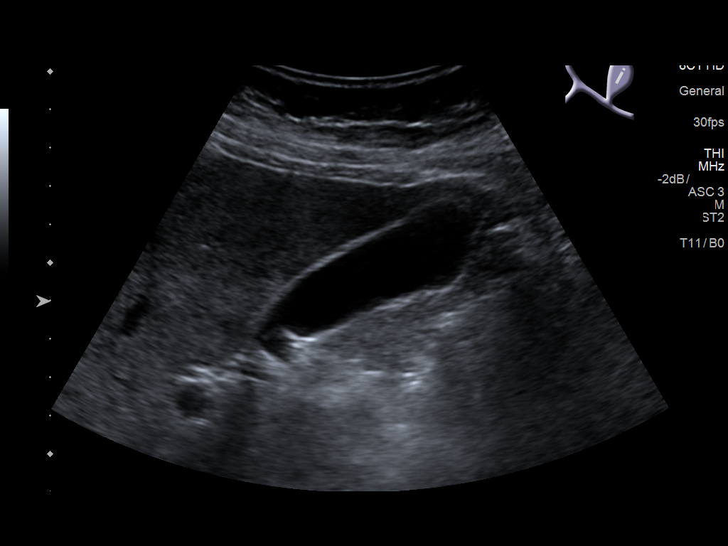
[im 31/41]
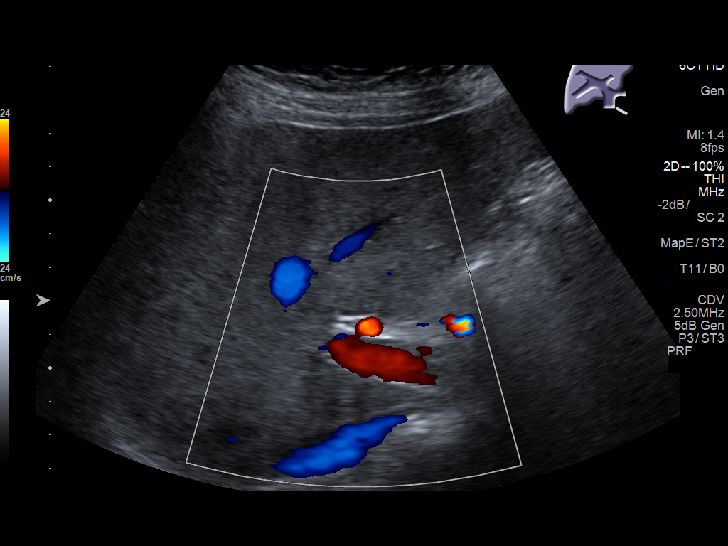
[im 34/41]
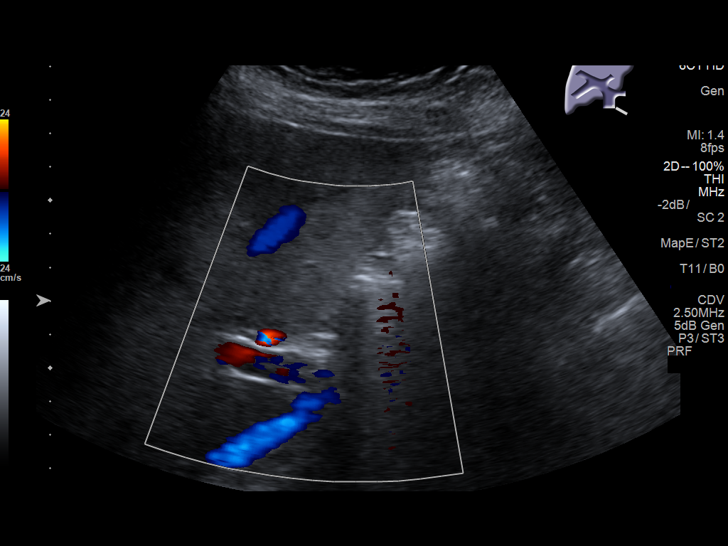
[im 37/41]
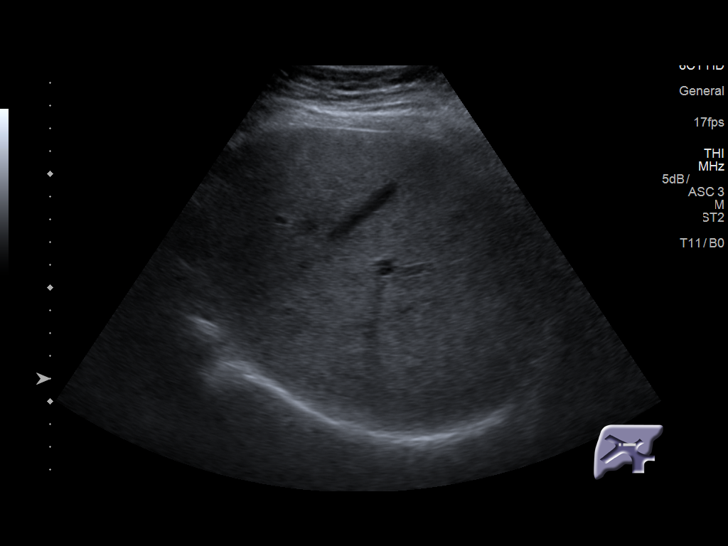
[im 41/41]
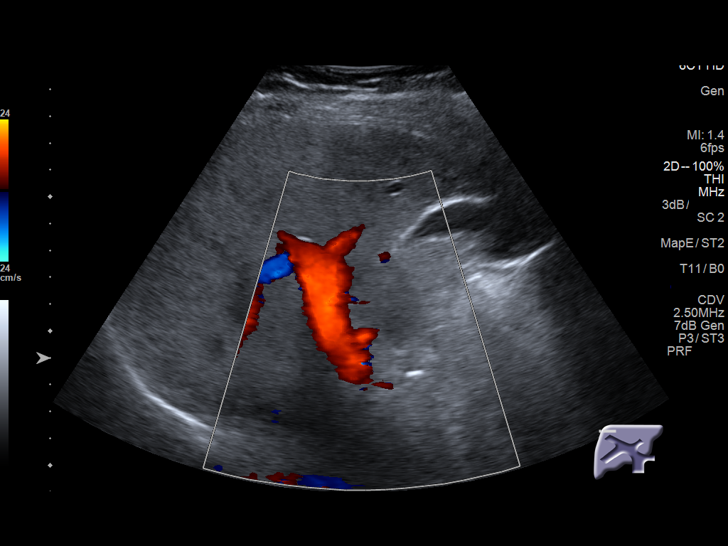

[14 of 25 positions shown; findings below may reference images not displayed]

FINDINGS: Gallbladder:

No gallstones or wall thickening visualized. No sonographic [place]
sign noted by sonographer.

Common bile duct:

Diameter: Normal caliber, 3-4 mm

Liver:

Diffusely increased echotexture suggests fatty infiltration. 8 mm
simple appearing cyst in the left hepatic lobe. No suspicious
hepatic lesion. Portal vein is patent on color Doppler imaging with
normal direction of blood flow towards the liver.
IMPRESSION: Fatty infiltration of the liver. Small benign-appearing cyst in the
left hepatic lobe, 8 mm. No acute findings.

## 2017-08-12 MED ORDER — FLUOXETINE HCL 20 MG OR CAPS
60.0000 mg | ORAL_CAPSULE | Freq: Every day | ORAL | 0 refills | Status: DC
Start: 2017-08-12 — End: 2017-12-05

## 2017-08-12 NOTE — Assessment & Plan Note (Signed)
History of dyspnea on exertion with a bili normal 2-D echocardiogram.

## 2017-08-12 NOTE — Assessment & Plan Note (Signed)
History of palpitations with recent event monitor that showed sinus rhythm, sinus tachycardia and sinus bradycardia without any other arrhythmias.

## 2017-08-12 NOTE — Progress Notes (Signed)
Mr. returns for follow-up of her outpatient noninvasive diagnostic test. Her monitor showed sinus rhythm/sinus bradycardia/sinus tachycardia without other arrhythmias. Her Myoview stress test and 2-D echo were entirely normal. I have reassured her. She thinks that her symptoms are related to this lack of sleep. We talked about the importance of quality sleep, exercise and diet. I will see her back in 6 months for follow-up.  Lorretta Harp, M.D., [place], [facility], [place] [address] [place], [place]  [address]  [phone] 08/12/2017 8:57 AM

## 2017-08-12 NOTE — Patient Instructions (Signed)
Medication Instructions: Your physician recommends that you continue on your current medications as directed. Please refer to the Current Medication list given to you today.  If you need a refill on your cardiac medications before your next appointment, please call your pharmacy.    Follow-Up: Your physician wants you to follow-up in: 6 months with Dr. Gwenlyn Found. You will receive a reminder letter in the mail two months in advance. If you don't receive a letter, please call our office at [phone] to schedule this follow-up appointment.   Thank you for choosing Heartcare at [facility]!!

## 2017-08-12 NOTE — Assessment & Plan Note (Signed)
History of atypical chest pain with recent stress test performed 07/22/17 which was entirely normal

## 2017-09-06 ENCOUNTER — Encounter (HOSPITAL_BASED_OUTPATIENT_CLINIC_OR_DEPARTMENT_OTHER): Payer: Self-pay | Admitting: Diagnostic Radiology

## 2017-09-13 ENCOUNTER — Telehealth (HOSPITAL_BASED_OUTPATIENT_CLINIC_OR_DEPARTMENT_OTHER): Payer: Self-pay | Admitting: Naturopath

## 2017-09-13 NOTE — Telephone Encounter (Signed)
(  TEXTING IS AN OPTION FOR [facility] ONLY)  Is this a [place] clinic? No      RETURN CALL: Detailed message on voicemail only      SUBJECT:  Medication Management/Questions     MEDICATION(S): medication for cough  CONCERNS/QUESTIONS: patient saw a provider at [facility] but cough is not getting better  ADDITIONAL INFORMATION: [facility] [address] [place] [phone]

## 2017-09-13 NOTE — Telephone Encounter (Signed)
Called patient, left VM message requesting call back to triage further.

## 2017-09-13 NOTE — Telephone Encounter (Signed)
Patient is calling back. A detailed voicemail is OK.

## 2017-09-13 NOTE — Telephone Encounter (Signed)
Original message:   "Management/Questions     MEDICATION(S): medication for cough  CONCERNS/QUESTIONS: patient saw a provider at [facility] but cough is not getting better  "  ADDITIONAL INFORMATION: [facility] [place] [phone]    Plan: Called pt and left a message for pt on both her home/cell and work number.  Advised pt that she will need to call for an appt so we can evaluate her cough, even though she has been to a provider at [facility]

## 2017-09-14 NOTE — Telephone Encounter (Signed)
Appointment made 4/2

## 2017-09-14 NOTE — Patient Instructions (Addendum)
Your procedure is scheduled on: Thursday September 29, 2017 at 8:30 am  Enter through the Main Entrance of [facility] at: 7:00 am  Pick up the phone at the desk and dial [phone].  Call this number if you have problems the morning of surgery: [phone].  Remember: Do NOT eat food or drink any liquids after: Midnight on Wednesday April 10  Take these medicines the morning of surgery with a SIP OF WATER: LOPID, PAXIL  STOP ALL VITAMINS AND SUPPLEMENTS 1 WEEK PRIOR TO SURGERY  DO NOT SMOKE DAY OF SURGERY  Do NOT wear jewelry (body piercing), metal hair clips/bobby pins, make-up, or nail polish. Do NOT wear lotions, powders, or perfumes.  You may wear deoderant. Do NOT shave for 48 hours prior to surgery. Do NOT bring valuables to the hospital. Contacts, dentures, or bridgework may not be worn into surgery. Leave suitcase in car.  After surgery it may be brought to your room.   For patients admitted to the hospital, checkout time is 11:00 AM the day of discharge.

## 2017-09-16 ENCOUNTER — Encounter (HOSPITAL_COMMUNITY)
Admission: RE | Admit: 2017-09-16 | Discharge: 2017-09-16 | Disposition: A | Discharge status: Home / Self Care | Payer: Managed Care, Other (non HMO) | Source: Ambulatory Visit | Attending: Obstetrics and Gynecology | Admitting: Obstetrics and Gynecology

## 2017-09-16 ENCOUNTER — Other Ambulatory Visit: Payer: Self-pay

## 2017-09-16 ENCOUNTER — Encounter (HOSPITAL_COMMUNITY): Payer: Self-pay

## 2017-09-16 DIAGNOSIS — Z01812 Encounter for preprocedural laboratory examination: Secondary | ICD-10-CM | POA: Diagnosis present

## 2017-09-16 HISTORY — DX: Dyspnea, unspecified: R06.00

## 2017-09-16 HISTORY — DX: Angina pectoris, unspecified: I20.9

## 2017-09-16 HISTORY — DX: Hyperlipidemia, unspecified: E78.5

## 2017-09-16 LAB — ABO/RH: ABO/RH(D): O POS

## 2017-09-16 LAB — CBC
HEMATOCRIT: 39.7 % (ref 36.0–46.0)
HEMOGLOBIN: 13.1 g/dL (ref 12.0–15.0)
MCH: 27.2 pg (ref 26.0–34.0)
MCHC: 33 g/dL (ref 30.0–36.0)
MCV: 82.5 fL (ref 78.0–100.0)
PLATELETS: 250 10*3/uL (ref 150–400)
RBC: 4.81 MIL/uL (ref 3.87–5.11)
RDW: 15.7 % — ABNORMAL HIGH (ref 11.5–15.5)
WBC: 7.8 10*3/uL (ref 4.0–10.5)

## 2017-09-16 LAB — COMPREHENSIVE METABOLIC PANEL
ALT: 18 U/L (ref 14–54)
AST: 20 U/L (ref 15–41)
Albumin: 3.9 g/dL (ref 3.5–5.0)
Alkaline Phosphatase: 75 U/L (ref 38–126)
Anion gap: 10 (ref 5–15)
BILIRUBIN TOTAL: 0.2 mg/dL — AB (ref 0.3–1.2)
BUN: 12 mg/dL (ref 6–20)
CHLORIDE: 104 mmol/L (ref 101–111)
CO2: 22 mmol/L (ref 22–32)
CREATININE: 0.63 mg/dL (ref 0.44–1.00)
Calcium: 9 mg/dL (ref 8.9–10.3)
GFR calc Af Amer: >60 mL/min (ref >60)
Glucose, Bld: 108 mg/dL — ABNORMAL HIGH (ref 65–99)
Potassium: 4.7 mmol/L (ref 3.5–5.1)
Sodium: 136 mmol/L (ref 135–145)
Total Protein: 7.7 g/dL (ref 6.5–8.1)

## 2017-09-16 LAB — TYPE AND SCREEN
ABO/RH(D): O POS
Antibody Screen: NEGATIVE

## 2017-09-16 NOTE — Pre-Procedure Instructions (Signed)
Dr. Marcell Barlow aware of history, viewed ekg

## 2017-09-20 ENCOUNTER — Encounter (HOSPITAL_BASED_OUTPATIENT_CLINIC_OR_DEPARTMENT_OTHER): Payer: Medicare HMO | Admitting: Naturopath

## 2017-09-28 NOTE — H&P (Signed)
Charlene Johnson is an 52 y.o. female G0P0 with menorrhagia.  Bleeding not decreased or well-regulated w OCP.  Pt desires definitive management.  D/W pt r/b/a of LAVH BS, wishes to proceed.  Had negative cardiac work-up prior to surgery.    Pertinent Gynecological History: G0P0  + abn pap, Last 06/2016 WNL, HR NPV neg No STD No LMP recorded.    Past Medical History:  Diagnosis Date  . Anginal pain (HCC)    chest pressure, left arm pain, SOB, SAW DR. Gwenlyn Found RECENTLY AND WAS CLEARED   . Dyspnea    RELATED TO FATIGUE  . Hyperlipemia   Basal Cell Cancer - excised Vasovagal syncope  Past Surgical History:  Procedure Laterality Date  . EYE SURGERY  05/1999   LASIX  . TONSILLECTOMY      Family History  Problem Relation Age of Onset  . Heart disease Father   . Hyperlipidemia Father   . Cancer Maternal Grandmother   . Cancer Maternal Grandfather   . Stroke Paternal Grandmother   . Cancer Paternal Grandfather     Social History:  reports that she has never smoked. She has never used smokeless tobacco. She reports that she drinks alcohol. She reports that she does not use drugs. single, Secretary/administrator  Allergies: No Known Allergies  Meds: Aleve, Paxil, Vit D, gemfibrozol, OCP    Review of Systems  Constitutional: Negative.   HENT: Negative.   Eyes: Negative.   Respiratory: Negative.   Cardiovascular: Positive for chest pain.       Neg cardiac evaluation  Gastrointestinal: Negative.   Genitourinary: Negative.   Musculoskeletal: Negative.   Skin: Negative.   Neurological: Negative.   Psychiatric/Behavioral: Negative.     There were no vitals taken for this visit. Physical Exam  Constitutional: She is oriented to person, place, and time. She appears well-developed and well-nourished.  HENT:  Head: Normocephalic and atraumatic.  Cardiovascular: Normal rate and regular rhythm.  Respiratory: Effort normal and breath sounds normal. No respiratory distress. She has no  wheezes.  GI: Soft. Bowel sounds are normal. She exhibits no distension. There is no tenderness.  Musculoskeletal: Normal range of motion.  Neurological: She is alert and oriented to person, place, and time.  Skin: Skin is warm and dry.  Psychiatric: She has a normal mood and affect. Her behavior is normal.    Assessment/Plan: 52yo G0 with menorrhagia for definitive therapy D/w pt r/b/a of LAVH/BS including r/b/a Ancef for prophylacis  Charlene Johnson 09/28/2017, 9:27 PM

## 2017-09-28 NOTE — Anesthesia Preprocedure Evaluation (Addendum)
Anesthesia Evaluation  Patient identified by MRN, date of birth, ID band Patient awake    Reviewed: Allergy & Precautions, NPO status , Patient's Chart, lab work & pertinent test results  Airway Mallampati: II  TM Distance: >3 FB Neck ROM: Full    Dental no notable dental hx.    Pulmonary neg pulmonary ROS,    Pulmonary exam normal breath sounds clear to auscultation       Cardiovascular Exercise Tolerance: Good negative cardio ROS Normal cardiovascular exam Rhythm:Regular Rate:Normal  Myoview 07/22/17- Normal perfusion No ischemia  Nuclear stress EF: 66%.  Blood pressure demonstrated a hypertensive response to exercise.  There was no ST segment deviation noted during stress.  This is a low risk study.     Neuro/Psych negative neurological ROS  negative psych ROS   GI/Hepatic negative GI ROS, Neg liver ROS,   Endo/Other  negative endocrine ROS  Renal/GU negative Renal ROS     Musculoskeletal negative musculoskeletal ROS (+)   Abdominal (+) + obese,   Peds  Hematology negative hematology ROS (+)   Anesthesia Other Findings   Reproductive/Obstetrics                            Lab Results  Component Value Date   WBC 7.8 09/16/2017   HGB 13.1 09/16/2017   HCT 39.7 09/16/2017   MCV 82.5 09/16/2017   PLT 250 09/16/2017    Anesthesia Physical Anesthesia Plan  ASA: II  Anesthesia Plan: General   Post-op Pain Management:    Induction: Intravenous  PONV Risk Score and Plan: Treatment may vary due to age or medical condition, Ondansetron, Scopolamine patch - Pre-op and Midazolam  Airway Management Planned: Oral ETT  Additional Equipment:   Intra-op Plan:   Post-operative Plan: Extubation in OR  Informed Consent: I have reviewed the patients History and Physical, chart, labs and discussed the procedure including the risks, benefits and alternatives for the proposed  anesthesia with the patient or authorized representative who has indicated his/her understanding and acceptance.   Dental advisory given  Plan Discussed with: CRNA  Anesthesia Plan Comments:        Anesthesia Quick Evaluation

## 2017-09-29 ENCOUNTER — Encounter (HOSPITAL_COMMUNITY): Payer: Self-pay

## 2017-09-29 ENCOUNTER — Ambulatory Visit (HOSPITAL_COMMUNITY): Payer: Managed Care, Other (non HMO) | Admitting: Anesthesiology

## 2017-09-29 ENCOUNTER — Other Ambulatory Visit: Payer: Self-pay

## 2017-09-29 ENCOUNTER — Encounter (HOSPITAL_COMMUNITY)
Admission: AD | Disposition: A | Discharge status: Home / Self Care | Payer: Self-pay | Source: Ambulatory Visit | Attending: Obstetrics and Gynecology

## 2017-09-29 ENCOUNTER — Observation Stay (HOSPITAL_COMMUNITY)
Admission: AD | Admit: 2017-09-29 | Discharge: 2017-09-30 | Disposition: A | Discharge status: Home / Self Care | Payer: Managed Care, Other (non HMO) | Source: Ambulatory Visit | Attending: Obstetrics and Gynecology | Admitting: Obstetrics and Gynecology

## 2017-09-29 ENCOUNTER — Encounter (HOSPITAL_BASED_OUTPATIENT_CLINIC_OR_DEPARTMENT_OTHER): Payer: Self-pay | Admitting: Naturopath

## 2017-09-29 DIAGNOSIS — Z1231 Encounter for screening mammogram for malignant neoplasm of breast: Secondary | ICD-10-CM

## 2017-09-29 DIAGNOSIS — N888 Other specified noninflammatory disorders of cervix uteri: Secondary | ICD-10-CM | POA: Diagnosis not present

## 2017-09-29 DIAGNOSIS — N9089 Other specified noninflammatory disorders of vulva and perineum: Secondary | ICD-10-CM | POA: Diagnosis not present

## 2017-09-29 DIAGNOSIS — N920 Excessive and frequent menstruation with regular cycle: Principal | ICD-10-CM | POA: Insufficient documentation

## 2017-09-29 DIAGNOSIS — Z79899 Other long term (current) drug therapy: Secondary | ICD-10-CM | POA: Insufficient documentation

## 2017-09-29 DIAGNOSIS — L918 Other hypertrophic disorders of the skin: Secondary | ICD-10-CM | POA: Insufficient documentation

## 2017-09-29 DIAGNOSIS — D259 Leiomyoma of uterus, unspecified: Secondary | ICD-10-CM | POA: Insufficient documentation

## 2017-09-29 DIAGNOSIS — E785 Hyperlipidemia, unspecified: Secondary | ICD-10-CM | POA: Diagnosis not present

## 2017-09-29 DIAGNOSIS — Z9079 Acquired absence of other genital organ(s): Secondary | ICD-10-CM

## 2017-09-29 DIAGNOSIS — Z85828 Personal history of other malignant neoplasm of skin: Secondary | ICD-10-CM | POA: Diagnosis not present

## 2017-09-29 DIAGNOSIS — Z90710 Acquired absence of both cervix and uterus: Secondary | ICD-10-CM

## 2017-09-29 HISTORY — DX: Acquired absence of both cervix and uterus: Z90.710

## 2017-09-29 HISTORY — DX: Acquired absence of other genital organ(s): Z90.79

## 2017-09-29 HISTORY — PX: LAPAROSCOPIC VAGINAL HYSTERECTOMY WITH SALPINGECTOMY: SHX6680

## 2017-09-29 LAB — PREGNANCY, URINE: PREG TEST UR: NEGATIVE

## 2017-09-29 SURGERY — HYSTERECTOMY, VAGINAL, LAPAROSCOPY-ASSISTED, WITH SALPINGECTOMY
Anesthesia: General | Site: Abdomen | Laterality: Bilateral

## 2017-09-29 MED ORDER — SODIUM CHLORIDE 0.9% FLUSH: 9.0000 mL | INTRAVENOUS | Status: DC | PRN

## 2017-09-29 MED ORDER — VITAMIN D (ERGOCALCIFEROL) 1.25 MG (50000 UNIT) PO CAPS: 50000.0000 [IU] | ORAL_CAPSULE | ORAL | Status: DC

## 2017-09-29 MED ORDER — ONDANSETRON HCL 4 MG/2ML IJ SOLN
INTRAMUSCULAR | Status: AC
  Filled 2017-09-29: qty 2

## 2017-09-29 MED ORDER — CEFAZOLIN SODIUM-DEXTROSE 2-4 GM/100ML-% IV SOLN
INTRAVENOUS | Status: AC
  Filled 2017-09-29: qty 100

## 2017-09-29 MED ORDER — LIDOCAINE HCL (CARDIAC) 20 MG/ML IV SOLN
INTRAVENOUS | Status: DC | PRN
  Administered 2017-09-29: 60 mg via INTRAVENOUS

## 2017-09-29 MED ORDER — HYDROMORPHONE HCL 1 MG/ML IJ SOLN
INTRAMUSCULAR | Status: AC
  Administered 2017-09-29: 0.25 mg via INTRAVENOUS
  Filled 2017-09-29: qty 0.5

## 2017-09-29 MED ORDER — DEXAMETHASONE SODIUM PHOSPHATE 10 MG/ML IJ SOLN
INTRAMUSCULAR | Status: DC | PRN
Start: 2017-09-29 — End: 2017-09-29
  Administered 2017-09-29: 10 mg via INTRAVENOUS

## 2017-09-29 MED ORDER — OXYCODONE-ACETAMINOPHEN 5-325 MG PO TABS: 1.0000 | ORAL_TABLET | ORAL | Status: DC | PRN

## 2017-09-29 MED ORDER — PROPOFOL 10 MG/ML IV BOLUS
INTRAVENOUS | Status: DC | PRN
  Administered 2017-09-29: 50 mg via INTRAVENOUS
  Administered 2017-09-29: 150 mg via INTRAVENOUS

## 2017-09-29 MED ORDER — FENTANYL CITRATE (PF) 100 MCG/2ML IJ SOLN
INTRAMUSCULAR | Status: DC | PRN
  Administered 2017-09-29: 100 ug via INTRAVENOUS
  Administered 2017-09-29: 50 ug via INTRAVENOUS
  Administered 2017-09-29: 100 ug via INTRAVENOUS
  Administered 2017-09-29 (×4): 50 ug via INTRAVENOUS

## 2017-09-29 MED ORDER — PROMETHAZINE HCL 25 MG/ML IJ SOLN
INTRAMUSCULAR | Status: AC
  Administered 2017-09-29: 6.25 mg via INTRAVENOUS
  Filled 2017-09-29: qty 1

## 2017-09-29 MED ORDER — MIDAZOLAM HCL 2 MG/2ML IJ SOLN
INTRAMUSCULAR | Status: DC | PRN
  Administered 2017-09-29: 2 mg via INTRAVENOUS

## 2017-09-29 MED ORDER — HYDROMORPHONE HCL 1 MG/ML IJ SOLN
INTRAMUSCULAR | Status: AC
  Filled 2017-09-29: qty 1

## 2017-09-29 MED ORDER — LIDOCAINE HCL (CARDIAC) 20 MG/ML IV SOLN
INTRAVENOUS | Status: AC
Start: 2017-09-29 — End: ?
  Filled 2017-09-29: qty 5

## 2017-09-29 MED ORDER — KETOROLAC TROMETHAMINE 30 MG/ML IJ SOLN
INTRAMUSCULAR | Status: DC | PRN
  Administered 2017-09-29: 30 mg via INTRAVENOUS

## 2017-09-29 MED ORDER — BUPIVACAINE HCL (PF) 0.25 % IJ SOLN
INTRAMUSCULAR | Status: AC
  Filled 2017-09-29: qty 30

## 2017-09-29 MED ORDER — GEMFIBROZIL 600 MG PO TABS
600.0000 mg | ORAL_TABLET | Freq: Two times a day (BID) | ORAL | Status: DC
Start: 2017-09-29 — End: 2017-09-30
  Administered 2017-09-29 – 2017-09-30 (×2): 600 mg via ORAL
  Filled 2017-09-29 (×2): qty 1

## 2017-09-29 MED ORDER — ACETAMINOPHEN 10 MG/ML IV SOLN
INTRAVENOUS | Status: AC
  Filled 2017-09-29: qty 100

## 2017-09-29 MED ORDER — SIMETHICONE 80 MG PO CHEW
80.0000 mg | CHEWABLE_TABLET | Freq: Four times a day (QID) | ORAL | Status: DC | PRN
  Administered 2017-09-30: 80 mg via ORAL
  Filled 2017-09-29: qty 1

## 2017-09-29 MED ORDER — GEMFIBROZIL 600 MG PO TABS
600.0000 mg | ORAL_TABLET | Freq: Two times a day (BID) | ORAL | Status: DC
  Filled 2017-09-29 (×3): qty 1

## 2017-09-29 MED ORDER — DIPHENHYDRAMINE HCL 12.5 MG/5ML PO ELIX: 12.5000 mg | ORAL_SOLUTION | Freq: Four times a day (QID) | ORAL | Status: DC | PRN

## 2017-09-29 MED ORDER — HYDROMORPHONE 1 MG/ML IV SOLN
INTRAVENOUS | Status: DC
  Administered 2017-09-29: 13:00:00 via INTRAVENOUS
  Filled 2017-09-29: qty 25

## 2017-09-29 MED ORDER — EPHEDRINE SULFATE 50 MG/ML IJ SOLN
INTRAMUSCULAR | Status: DC | PRN
  Administered 2017-09-29: 5 mg via INTRAVENOUS

## 2017-09-29 MED ORDER — DEXAMETHASONE SODIUM PHOSPHATE 10 MG/ML IJ SOLN
INTRAMUSCULAR | Status: AC
  Filled 2017-09-29: qty 1

## 2017-09-29 MED ORDER — SODIUM CHLORIDE 0.9 % IJ SOLN
INTRAMUSCULAR | Status: DC | PRN
  Administered 2017-09-29: 10 mL

## 2017-09-29 MED ORDER — ONDANSETRON HCL 4 MG/2ML IJ SOLN
INTRAMUSCULAR | Status: DC | PRN
  Administered 2017-09-29: 4 mg via INTRAVENOUS

## 2017-09-29 MED ORDER — SCOPOLAMINE 1 MG/3DAYS TD PT72
1.0000 | MEDICATED_PATCH | Freq: Once | TRANSDERMAL | Status: DC
  Administered 2017-09-29: 1.5 mg via TRANSDERMAL

## 2017-09-29 MED ORDER — FENTANYL CITRATE (PF) 100 MCG/2ML IJ SOLN
INTRAMUSCULAR | Status: AC
  Filled 2017-09-29: qty 2

## 2017-09-29 MED ORDER — CEFAZOLIN SODIUM-DEXTROSE 2-4 GM/100ML-% IV SOLN
2.0000 g | INTRAVENOUS | Status: AC
  Administered 2017-09-29: 2 g via INTRAVENOUS

## 2017-09-29 MED ORDER — ONDANSETRON HCL 4 MG/2ML IJ SOLN
4.0000 mg | Freq: Four times a day (QID) | INTRAMUSCULAR | Status: DC | PRN
  Filled 2017-09-29: qty 2

## 2017-09-29 MED ORDER — KETOROLAC TROMETHAMINE 30 MG/ML IJ SOLN
INTRAMUSCULAR | Status: AC
  Filled 2017-09-29: qty 1

## 2017-09-29 MED ORDER — NALOXONE HCL 0.4 MG/ML IJ SOLN: 0.4000 mg | INTRAMUSCULAR | Status: DC | PRN

## 2017-09-29 MED ORDER — HYDROMORPHONE HCL 1 MG/ML IJ SOLN
INTRAMUSCULAR | Status: DC | PRN
  Administered 2017-09-29: 1 mg via INTRAVENOUS

## 2017-09-29 MED ORDER — ONDANSETRON HCL 4 MG/2ML IJ SOLN
4.0000 mg | Freq: Four times a day (QID) | INTRAMUSCULAR | Status: DC | PRN
  Administered 2017-09-29: 4 mg via INTRAVENOUS

## 2017-09-29 MED ORDER — ROCURONIUM BROMIDE 100 MG/10ML IV SOLN
INTRAVENOUS | Status: DC | PRN
  Administered 2017-09-29: 10 mg via INTRAVENOUS
  Administered 2017-09-29: 50 mg via INTRAVENOUS
  Administered 2017-09-29 (×2): 20 mg via INTRAVENOUS

## 2017-09-29 MED ORDER — PROPOFOL 10 MG/ML IV BOLUS
INTRAVENOUS | Status: AC
  Filled 2017-09-29: qty 20

## 2017-09-29 MED ORDER — FENTANYL CITRATE (PF) 250 MCG/5ML IJ SOLN
INTRAMUSCULAR | Status: AC
  Filled 2017-09-29: qty 5

## 2017-09-29 MED ORDER — HYDROMORPHONE HCL 1 MG/ML IJ SOLN
0.2500 mg | INTRAMUSCULAR | Status: DC | PRN
  Administered 2017-09-29: 0.25 mg via INTRAVENOUS

## 2017-09-29 MED ORDER — ALUM & MAG HYDROXIDE-SIMETH 200-200-20 MG/5ML PO SUSP: 30.0000 mL | ORAL | Status: DC | PRN

## 2017-09-29 MED ORDER — MIDAZOLAM HCL 2 MG/2ML IJ SOLN
INTRAMUSCULAR | Status: AC
  Filled 2017-09-29: qty 2

## 2017-09-29 MED ORDER — SODIUM CHLORIDE 0.9 % IJ SOLN
INTRAMUSCULAR | Status: AC
  Filled 2017-09-29: qty 10

## 2017-09-29 MED ORDER — ONDANSETRON HCL 4 MG PO TABS: 4.0000 mg | ORAL_TABLET | Freq: Four times a day (QID) | ORAL | Status: DC | PRN

## 2017-09-29 MED ORDER — BUPIVACAINE HCL (PF) 0.25 % IJ SOLN
INTRAMUSCULAR | Status: DC | PRN
  Administered 2017-09-29: 18 mL

## 2017-09-29 MED ORDER — SODIUM CHLORIDE 0.9 % IJ SOLN
INTRAMUSCULAR | Status: AC
  Filled 2017-09-29: qty 100

## 2017-09-29 MED ORDER — SUGAMMADEX SODIUM 200 MG/2ML IV SOLN
INTRAVENOUS | Status: DC | PRN
  Administered 2017-09-29: 171.6 mg via INTRAVENOUS

## 2017-09-29 MED ORDER — MEPERIDINE HCL 25 MG/ML IJ SOLN: 6.2500 mg | INTRAMUSCULAR | Status: DC | PRN

## 2017-09-29 MED ORDER — VASOPRESSIN 20 UNIT/ML IV SOLN
INTRAVENOUS | Status: DC | PRN
  Administered 2017-09-29: 20 mL via INTRAMUSCULAR

## 2017-09-29 MED ORDER — ROCURONIUM BROMIDE 100 MG/10ML IV SOLN
INTRAVENOUS | Status: AC
  Filled 2017-09-29: qty 1

## 2017-09-29 MED ORDER — IBUPROFEN 800 MG PO TABS
800.0000 mg | ORAL_TABLET | Freq: Three times a day (TID) | ORAL | Status: DC | PRN
  Administered 2017-09-30: 800 mg via ORAL
  Filled 2017-09-29: qty 1

## 2017-09-29 MED ORDER — EPHEDRINE 5 MG/ML INJ
INTRAVENOUS | Status: AC
  Filled 2017-09-29: qty 10

## 2017-09-29 MED ORDER — GUAIFENESIN 100 MG/5ML PO SOLN: 15.0000 mL | ORAL | Status: DC | PRN

## 2017-09-29 MED ORDER — MENTHOL 3 MG MT LOZG: 1.0000 | LOZENGE | OROMUCOSAL | Status: DC | PRN

## 2017-09-29 MED ORDER — PROMETHAZINE HCL 25 MG/ML IJ SOLN
6.2500 mg | INTRAMUSCULAR | Status: DC | PRN
  Administered 2017-09-29: 6.25 mg via INTRAVENOUS

## 2017-09-29 MED ORDER — DIPHENHYDRAMINE HCL 50 MG/ML IJ SOLN: 12.5000 mg | Freq: Four times a day (QID) | INTRAMUSCULAR | Status: DC | PRN

## 2017-09-29 MED ORDER — HYDROCODONE-ACETAMINOPHEN 7.5-325 MG PO TABS: 1.0000 | ORAL_TABLET | Freq: Once | ORAL | Status: DC | PRN

## 2017-09-29 MED ORDER — SCOPOLAMINE 1 MG/3DAYS TD PT72
MEDICATED_PATCH | TRANSDERMAL | Status: AC
  Administered 2017-09-29: 1.5 mg via TRANSDERMAL
  Filled 2017-09-29: qty 1

## 2017-09-29 MED ORDER — SUGAMMADEX SODIUM 200 MG/2ML IV SOLN
INTRAVENOUS | Status: AC
  Filled 2017-09-29: qty 2

## 2017-09-29 MED ORDER — PAROXETINE HCL 10 MG PO TABS
10.0000 mg | ORAL_TABLET | Freq: Every day | ORAL | Status: DC
  Administered 2017-09-30: 10 mg via ORAL
  Filled 2017-09-29 (×2): qty 1

## 2017-09-29 MED ORDER — VASOPRESSIN 20 UNIT/ML IV SOLN
INTRAVENOUS | Status: AC
  Filled 2017-09-29: qty 1

## 2017-09-29 MED ORDER — LACTATED RINGERS IV SOLN
INTRAVENOUS | Status: DC
  Administered 2017-09-29: 125 mL/h via INTRAVENOUS
  Administered 2017-09-29 (×2): via INTRAVENOUS

## 2017-09-29 MED ORDER — ACETAMINOPHEN 10 MG/ML IV SOLN
1000.0000 mg | Freq: Once | INTRAVENOUS | Status: DC | PRN
  Administered 2017-09-29: 1000 mg via INTRAVENOUS

## 2017-09-29 MED ORDER — LACTATED RINGERS IV SOLN
INTRAVENOUS | Status: DC
  Administered 2017-09-29 – 2017-09-30 (×3): via INTRAVENOUS

## 2017-09-29 SURGICAL SUPPLY — 37 items
CABLE HIGH FREQUENCY MONO STRZ (ELECTRODE) IMPLANT
CONT PATH 16OZ SNAP LID 3702 (MISCELLANEOUS) ×2 IMPLANT
COVER BACK TABLE 60X90IN (DRAPES) ×2 IMPLANT
COVER MAYO STAND STRL (DRAPES) ×2 IMPLANT
DECANTER SPIKE VIAL GLASS SM (MISCELLANEOUS) ×6 IMPLANT
DRSG OPSITE POSTOP 3X4 (GAUZE/BANDAGES/DRESSINGS) IMPLANT
DURAPREP 26ML APPLICATOR (WOUND CARE) ×2 IMPLANT
ELECT REM PT RETURN 9FT ADLT (ELECTROSURGICAL) ×2
ELECTRODE REM PT RTRN 9FT ADLT (ELECTROSURGICAL) ×1 IMPLANT
FILTER SMOKE EVAC LAPAROSHD (FILTER) ×2 IMPLANT
GLOVE BIO SURGEON STRL SZ 6.5 (GLOVE) ×6 IMPLANT
GLOVE BIOGEL PI IND STRL 7.0 (GLOVE) ×3 IMPLANT
GLOVE BIOGEL PI INDICATOR 7.0 (GLOVE) ×3
LEGGING LITHOTOMY PAIR STRL (DRAPES) ×2 IMPLANT
NEEDLE INSUFFLATION 120MM (ENDOMECHANICALS) ×2 IMPLANT
NS IRRIG 1000ML POUR BTL (IV SOLUTION) ×2 IMPLANT
PACK LAVH (CUSTOM PROCEDURE TRAY) ×2 IMPLANT
PACK ROBOTIC GOWN (GOWN DISPOSABLE) ×2 IMPLANT
PACK TRENDGUARD 450 HYBRID PRO (MISCELLANEOUS) ×1 IMPLANT
PACK TRENDGUARD 600 HYBRD PROC (MISCELLANEOUS) IMPLANT
PROTECTOR NERVE ULNAR (MISCELLANEOUS) ×4 IMPLANT
SET CYSTO W/LG BORE CLAMP LF (SET/KITS/TRAYS/PACK) IMPLANT
SET IRRIG TUBING LAPAROSCOPIC (IRRIGATION / IRRIGATOR) IMPLANT
SHEARS HARMONIC ACE PLUS 36CM (ENDOMECHANICALS) ×2 IMPLANT
SLEEVE XCEL OPT CAN 5 100 (ENDOMECHANICALS) ×4 IMPLANT
SUT VIC AB 1 CT1 18XBRD ANBCTR (SUTURE) ×2 IMPLANT
SUT VIC AB 1 CT1 8-18 (SUTURE) ×2
SUT VIC AB 2-0 CT1 (SUTURE) ×6 IMPLANT
SUT VIC AB 4-0 PS2 27 (SUTURE) ×2 IMPLANT
SUT VICRYL 0 TIES 12 18 (SUTURE) ×2 IMPLANT
SUT VICRYL 0 UR6 27IN ABS (SUTURE) IMPLANT
TOWEL OR 17X24 6PK STRL BLUE (TOWEL DISPOSABLE) ×4 IMPLANT
TRAY FOLEY W/BAG SLVR 14FR (SET/KITS/TRAYS/PACK) ×2 IMPLANT
TRENDGUARD 450 HYBRID PRO PACK (MISCELLANEOUS) ×2
TRENDGUARD 600 HYBRID PROC PK (MISCELLANEOUS)
TROCAR XCEL NON-BLD 5MMX100MML (ENDOMECHANICALS) ×2 IMPLANT
WARMER LAPAROSCOPE (MISCELLANEOUS) ×2 IMPLANT

## 2017-09-29 NOTE — Op Note (Signed)
NAME:  Charlene Johnson, Charlene Johnson                     ACCOUNT NO.:  MEDICAL RECORD NO.:  [id number]  LOCATION:                                 FACILITY:  PHYSICIAN:  Thornell Sartorius, MD             DATE OF BIRTH:  DATE OF PROCEDURE:  09/29/2017 DATE OF DISCHARGE:                              OPERATIVE REPORT   PREOPERATIVE DIAGNOSIS:  Menorrhagia.  POSTOPERATIVE DIAGNOSIS:  Menorrhagia.  PROCEDURE:  Laparoscopic-assisted vaginal hysterectomy with bilateral salpingectomy and skin tag removal.  SURGEON:  Thornell Sartorius, MD.  ASSISTANTCarlynn Purl, DO.  ANESTHESIA:  Local and general.  ESTIMATED BLOOD LOSS:  Approximately 300 cc.  INTRAVENOUS FLUIDS:  Per Anesthesia.  URINE OUTPUT:  Per Anesthesia.  Clear urine at the end of the procedure.  COMPLICATIONS:  None.  PATHOLOGY:  Uterus, tubes, and cervix as well as skin tags.  DESCRIPTION OF PROCEDURE:  After informed consent was reviewed with the patient including risks, benefits, and alternatives of the surgical procedure, she was transported to the operating table, placed on the table in supine position and then placed in the Yellofins stirrups.  Her arms were tucked.  Anesthesia was induced and found to be adequate.  She was then prepped and draped in the normal sterile fashion.  Using an open-sided speculum and the single-tooth tenaculum, a Hulka manipulator was placed on cervix.  Gloves were changed.  Attention was turned to the abdominal portion of the case.  Approximately 1 mm infraumbilical incision was made.  Using the Veress needle, pneumoperitoneum was obtained after passing the hanging drop test.  The port was placed under direct visualization.  The accessory ports were placed on both the right and left under direct visualization.  The pelvic survey revealed a normal-appearing uterus, tubes, and ovaries bilaterally.  The tube was grasped on the patient's left, elevated, and using a harmonic scalpel excised to the level of the  cornu.  At this level, the mesosalpinx and round ligament and cardinal ligaments were incised using the Harmonic scalpel to the level of the uterine artery.  At this level, bladder flap was created to the midline.  Attention was then turned to performing similar procedure on the right.  The tube was grasped and elevated and excised using Harmonic scalpel to the level of the cornu.  At this level, then the round ligament and the cardinal ligaments were excised to the level of the uterine artery.  The bladder flap was met up with the previously created bladder flap.  Instruments were secured and attention was turned to the vaginal portion of the case.  Initially, the cervix was instilled with 20 cc of vasopressin.  The cervix was circumscribed with Bovie cautery.  Attempt was made to enter anteriorly, which was unsuccessful.  Attempt was made to enter posteriorly with some difficulty due to fibroid on entry to the uterus.  We entered posteriorly, then the uterosacral ligaments were identified and incised and held bilaterally in a stepwise fashion.  The cardinal ligaments were ligated and held with 0 Vicryl.  The uterus was delivered.  The posterior cuff was ran.  The  uterosacral ligaments were plicated.  The cuff was closed with 2-0 Vicryl in a running fashion.  Bilaterally skin tags were noted in patient;s pantiline, she had requested removal.  They were grasped and elevated with forceps.  Removed in entirity with scapel.  Silver nitrate used to cauterize base.    Gloves and gowns were changed.  Attention was turned back to the abdominal portion.  A brief pelvic survey performed revealed no active bleeding.  The ureters were identified bilaterally by peristalsis.  The gas was removed from the abdomen.  The ports were closed with 4-0 Vicryl as a deep stitch and Dermabond on the skin.  The patient tolerated the procedure well. Sponge, lap, and needle counts were correct x2 per the operating  room staff.     Thornell Sartorius, MD     JB/MEDQ  D:  09/29/2017  T:  09/29/2017  Job:  [id number]

## 2017-09-29 NOTE — Progress Notes (Addendum)
Day of Surgery Procedure(s) (LRB): LAPAROSCOPIC ASSISTED VAGINAL HYSTERECTOMY WITH SALPINGECTOMY (Bilateral)  Subjective: Patient reports incisional pain and tolerating PO.    Objective: I have reviewed patient's vital signs and intake and output.  General: alert and no distress Resp: clear to auscultation bilaterally Cardio: regular rate and rhythm GI: soft, non-tender; bowel sounds normal; no masses,  no organomegaly Extremities: extremities normal, atraumatic, no cyanosis or edema  Assessment: s/p Procedure(s): LAPAROSCOPIC ASSISTED VAGINAL HYSTERECTOMY WITH SALPINGECTOMY (Bilateral): stable and progressing well  Plan: Advance diet Encourage ambulation Advance to PO medication  Routine PostOp care.     LOS: 0 days    Iris Tatsch Bovard-Stuckert 09/29/2017, 5:16 PM

## 2017-09-29 NOTE — Anesthesia Procedure Notes (Signed)
Procedure Name: Intubation Date/Time: 09/29/2017 8:46 AM Performed by: Genevie Ann, CRNA Pre-anesthesia Checklist: Patient identified, Emergency Drugs available, Suction available, Patient being monitored and Timeout performed Patient Re-evaluated:Patient Re-evaluated prior to induction Oxygen Delivery Method: Circle system utilized Preoxygenation: Pre-oxygenation with 100% oxygen Induction Type: IV induction Ventilation: Mask ventilation without difficulty and Oral airway inserted - appropriate to patient size Laryngoscope Size: Mac and 3 Grade View: Grade I Tube type: Oral Tube size: 7.0 mm Number of attempts: 1 Placement Confirmation: ETT inserted through vocal cords under direct vision,  positive ETCO2 and breath sounds checked- equal and bilateral Secured at: 21 cm Dental Injury: Teeth and Oropharynx as per pre-operative assessment

## 2017-09-29 NOTE — Transfer of Care (Signed)
Immediate Anesthesia Transfer of Care Note  Patient: Charlene Johnson  Procedure(s) Performed: LAPAROSCOPIC ASSISTED VAGINAL HYSTERECTOMY WITH SALPINGECTOMY (Bilateral Abdomen)  Patient Location: PACU  Anesthesia Type:General  Level of Consciousness: awake, alert  and oriented  Airway & Oxygen Therapy: Patient Spontanous Breathing and Patient connected to nasal cannula oxygen  Post-op Assessment: Report given to RN and Post -op Vital signs reviewed and stable  Post vital signs: Reviewed and stable  Last Vitals:  Vitals Value Taken Time  BP 157/71 09/29/2017 11:16 AM  Temp 36.8 C 09/29/2017 11:16 AM  Pulse 89 09/29/2017 11:19 AM  Resp 23 09/29/2017 11:19 AM  SpO2 93 % 09/29/2017 11:19 AM  Vitals shown include unvalidated device data.  Last Pain:  Vitals:   09/29/17 0719  TempSrc: Oral  PainSc: 1       Patients Stated Pain Goal: 4 (44/69/50 7225)  Complications: No apparent anesthesia complications

## 2017-09-29 NOTE — Interval H&P Note (Signed)
History and Physical Interval Note:  09/29/2017 7:48 AM  [name]  has presented today for surgery, with the diagnosis of Menorrhagia  The various methods of treatment have been discussed with the patient and family. After consideration of risks, benefits and other options for treatment, the patient has consented to  Procedure(s): LAPAROSCOPIC ASSISTED VAGINAL HYSTERECTOMY WITH SALPINGECTOMY (Bilateral) as a surgical intervention .  The patient's history has been reviewed, patient examined, no change in status, stable for surgery.  I have reviewed the patient's chart and labs.  Questions were answered to the patient's satisfaction.     Charlene Johnson

## 2017-09-29 NOTE — Anesthesia Postprocedure Evaluation (Signed)
Anesthesia Post Note  Patient: Charlene Johnson  Procedure(s) Performed: LAPAROSCOPIC ASSISTED VAGINAL HYSTERECTOMY WITH SALPINGECTOMY (Bilateral Abdomen)     Patient location during evaluation: [facility] Anesthesia Type: General Level of consciousness: awake and alert Pain management: pain level controlled Vital Signs Assessment: post-procedure vital signs reviewed and stable Respiratory status: spontaneous breathing, nonlabored ventilation, respiratory function stable and patient connected to nasal cannula oxygen Cardiovascular status: blood pressure returned to baseline and stable Postop Assessment: no apparent nausea or vomiting Anesthetic complications: no    Last Vitals:  Vitals:   09/29/17 1301 09/29/17 1400  BP: (!) 141/72 139/74  Pulse: 93 88  Resp: 16 20  Temp: 36.8 C 36.6 C  SpO2: 98% 99%    Last Pain:  Vitals:   09/29/17 1400  TempSrc: Oral  PainSc:    Pain Goal: Patients Stated Pain Goal: 3 (09/29/17 1301)               Barnet Glasgow

## 2017-09-29 NOTE — Brief Op Note (Signed)
09/29/2017  11:03 AM  PATIENT:  [name]  52 y.o. female  PRE-OPERATIVE DIAGNOSIS:  Menorrhagia  POST-OPERATIVE DIAGNOSIS:  menorrhagia  PROCEDURE:  Procedure(s): LAPAROSCOPIC ASSISTED VAGINAL HYSTERECTOMY WITH SALPINGECTOMY (Bilateral) also skin tag removal - vulva  SURGEON:  Surgeon(s) and Role:    * Bovard-Stuckert, Jeren Dufrane, MD - Primary  PHYSICIAN ASSISTANT: Banga, Cecilia DO  ANESTHESIA:   local and general  EBL:  300 mL   BLOOD ADMINISTERED:none  DRAINS: Urinary Catheter (Foley)   LOCAL MEDICATIONS USED:  LIDOCAINE   SPECIMEN:  Source of Specimen:  uterus, tubes and cervix; also skin tags  DISPOSITION OF SPECIMEN:  PATHOLOGY  COUNTS:  YES  TOURNIQUET:  * No tourniquets in log *  DICTATION: .Other Dictation: Dictation Number [id number]  PLAN OF CARE: Admit for overnight observation  PATIENT DISPOSITION:  PACU - hemodynamically stable.   Delay start of Pharmacological VTE agent (>24hrs) due to surgical blood loss or risk of bleeding: not applicable

## 2017-09-30 ENCOUNTER — Encounter (HOSPITAL_COMMUNITY): Payer: Self-pay | Admitting: Obstetrics and Gynecology

## 2017-09-30 DIAGNOSIS — N920 Excessive and frequent menstruation with regular cycle: Secondary | ICD-10-CM | POA: Diagnosis not present

## 2017-09-30 LAB — BASIC METABOLIC PANEL
ANION GAP: 10 (ref 5–15)
BUN: 12 mg/dL (ref 6–20)
CHLORIDE: 106 mmol/L (ref 101–111)
CO2: 23 mmol/L (ref 22–32)
Calcium: 8.5 mg/dL — ABNORMAL LOW (ref 8.9–10.3)
Creatinine, Ser: 0.71 mg/dL (ref 0.44–1.00)
GFR calc non Af Amer: >60 mL/min (ref >60)
Glucose, Bld: 127 mg/dL — ABNORMAL HIGH (ref 65–99)
Potassium: 4 mmol/L (ref 3.5–5.1)
Sodium: 139 mmol/L (ref 135–145)

## 2017-09-30 LAB — CBC
HCT: 35.8 % — ABNORMAL LOW (ref 36.0–46.0)
Hemoglobin: 11.6 g/dL — ABNORMAL LOW (ref 12.0–15.0)
MCH: 27 pg (ref 26.0–34.0)
MCHC: 32.4 g/dL (ref 30.0–36.0)
MCV: 83.3 fL (ref 78.0–100.0)
Platelets: 230 10*3/uL (ref 150–400)
RBC: 4.3 MIL/uL (ref 3.87–5.11)
RDW: 15.3 % (ref 11.5–15.5)
WBC: 12.1 10*3/uL — ABNORMAL HIGH (ref 4.0–10.5)

## 2017-09-30 MED ORDER — NAPROXEN 500 MG PO TABS: 500.0000 mg | ORAL_TABLET | Freq: Two times a day (BID) | ORAL | 1 refills | Status: DC | PRN

## 2017-09-30 MED ORDER — OXYCODONE-ACETAMINOPHEN 5-325 MG PO TABS: 1.0000 | ORAL_TABLET | Freq: Four times a day (QID) | ORAL | 0 refills | Status: DC | PRN

## 2017-09-30 NOTE — Addendum Note (Signed)
Addendum  created 09/30/17 0844 by Flossie Dibble, CRNA   Sign clinical note

## 2017-09-30 NOTE — Progress Notes (Signed)
Patient provided discharge teaching with teach-back. All questions answered and patient verbalized understanding of procedure, follow up care, prescriptions, etc.

## 2017-09-30 NOTE — Anesthesia Postprocedure Evaluation (Signed)
Anesthesia Post Note  Patient: Raychel Dowler  Procedure(s) Performed: LAPAROSCOPIC ASSISTED VAGINAL HYSTERECTOMY WITH SALPINGECTOMY (Bilateral Abdomen)     Patient location during evaluation: [facility] Anesthesia Type: General Level of consciousness: awake and alert Pain management: satisfactory to patient Vital Signs Assessment: post-procedure vital signs reviewed and stable Respiratory status: spontaneous breathing and respiratory function stable Cardiovascular status: stable Postop Assessment: adequate PO intake Anesthetic complications: no    Last Vitals:  Vitals:   09/30/17 0616 09/30/17 0815  BP: 128/62 113/65  Pulse: 62 62  Resp: 16 18  Temp: 37.1 C   SpO2: 97% 96%    Last Pain:  Vitals:   09/30/17 0616  TempSrc: Oral  PainSc:    Pain Goal: Patients Stated Pain Goal: 3 (09/29/17 2158)               Katherina Mires

## 2017-09-30 NOTE — Discharge Summary (Signed)
Physician Discharge Summary  Patient ID: Charlene Johnson MRN: [id number] DOB/AGE: [birth year] 52 y.o.  Admit date: 09/29/2017 Discharge date: 09/30/2017  Admission Diagnoses:  Discharge Diagnoses:  Principal Problem:   S/P laparoscopic assisted vaginal hysterectomy (LAVH) Active Problems:   Status post bilateral salpingectomy   Discharged Condition: good  Hospital Course: Admitted for surgery - underwent LAVH/BS without comp/diff.  Routine Postop care.  Ambulating, voiding, tolerating po.  Labs stable.   Consults: None  Significant Diagnostic Studies: labs: CBC, BMP  Treatments: IV hydration and surgery: LAVH/BS  Discharge Exam: Blood pressure 128/62, pulse 62, temperature 98.7 F (37.1 C), temperature source Oral, resp. rate 16, height 5\' 3"  (1.6 m), weight 85.7 kg (189 lb), SpO2 97 %. General appearance: alert and no distress Resp: clear to auscultation bilaterally Cardio: regular rate and rhythm GI: soft, non-tender; bowel sounds normal; no masses,  no organomegaly Extremities: extremities normal, atraumatic, no cyanosis or edema Incision/Wound:C/D/I  Disposition: Discharge disposition: 01-Home or Self Care       Discharge Instructions    Call MD for:  persistant nausea and vomiting   Complete by:  As directed    Call MD for:  redness, tenderness, or signs of infection (pain, swelling, redness, odor or green/yellow discharge around incision site)   Complete by:  As directed    Call MD for:  severe uncontrolled pain   Complete by:  As directed    Diet - low sodium heart healthy   Complete by:  As directed    Discharge instructions   Complete by:  As directed    Call [phone] with questions or problems   Driving Restrictions   Complete by:  As directed    While taking strong pain medicine   Increase activity slowly   Complete by:  As directed    Lifting restrictions   Complete by:  As directed    No greater than 10-15lbs for 4-6 weeks   May shower / Bathe    Complete by:  As directed    May walk up steps   Complete by:  As directed    Sexual Activity Restrictions   Complete by:  As directed    Pelvic rest - no douching, tampons or sex for 6 weeks     Allergies as of 09/30/2017   No Known Allergies     Medication List    STOP taking these medications   naproxen sodium 220 MG tablet Commonly known as:  ALEVE   SRONYX 0.1-20 MG-MCG tablet Generic drug:  levonorgestrel-ethinyl estradiol     TAKE these medications   gemfibrozil 600 MG tablet Commonly known as:  LOPID Take 1 pill bid What changed:    how much to take  how to take this  when to take this  additional instructions   naproxen 500 MG tablet Commonly known as:  NAPROSYN Take 1 tablet (500 mg total) by mouth 2 (two) times daily as needed.   oxyCODONE-acetaminophen 5-325 MG tablet Commonly known as:  PERCOCET/ROXICET Take 1-2 tablets by mouth every 6 (six) hours as needed for severe pain (moderate to severe pain (when tolerating fluids)).   PARoxetine 10 MG tablet Commonly known as:  PAXIL Take 10 mg by mouth daily.   Vitamin D (Ergocalciferol) 50000 units Caps capsule Commonly known as:  DRISDOL Take 50,000 Units by mouth every 7 (seven) days. On Saturday      Follow-up Information    Bovard-Stuckert, Aveen Stansel, MD. Schedule an appointment as soon as possible for a  visit in 2 week(s).   Specialty:  Obstetrics and Gynecology Why:  incision check and 6 wk for postop check.   Contact information: [address] [place] [address] [phone]           Signed: Janyth Contes 09/30/2017, 7:42 AM

## 2017-09-30 NOTE — Plan of Care (Signed)
  Problem: Nutrition: Goal: Adequate nutrition will be maintained Outcome: Progressing   Problem: Pain Managment: Goal: General experience of comfort will improve Outcome: Progressing   Problem: Safety: Goal: Ability to remain free from injury will improve Outcome: Progressing   Problem: Education: Goal: Knowledge of the prescribed therapeutic regimen will improve Outcome: Progressing

## 2017-09-30 NOTE — Progress Notes (Addendum)
1 Day Post-Op Procedure(s) (LRB): LAPAROSCOPIC ASSISTED VAGINAL HYSTERECTOMY WITH SALPINGECTOMY (Bilateral)  Subjective: Patient reports incisional pain, tolerating PO and + flatus.    Objective: I have reviewed patient's vital signs, intake and output and labs.  General: alert and no distress Resp: clear to auscultation bilaterally Cardio: regular rate and rhythm GI: soft, non-tender; bowel sounds normal; no masses,  no organomegaly and incision: clean, dry and intact Extremities: extremities normal, atraumatic, no cyanosis or edema  Assessment: s/p Procedure(s): LAPAROSCOPIC ASSISTED VAGINAL HYSTERECTOMY WITH SALPINGECTOMY (Bilateral): stable and progressing well  Plan: Advance diet Encourage ambulation Advance to PO medication Discharge home when voiding, pain controlled, ambulating and tolerating po. Will d/c with naprosyn and percocet   LOS: 0 days    Charlene Johnson 09/30/2017, 7:28 AM

## 2017-10-06 ENCOUNTER — Ambulatory Visit (HOSPITAL_BASED_OUTPATIENT_CLINIC_OR_DEPARTMENT_OTHER): Payer: Medicare HMO

## 2017-10-18 ENCOUNTER — Ambulatory Visit (HOSPITAL_BASED_OUTPATIENT_CLINIC_OR_DEPARTMENT_OTHER): Payer: Medicare HMO

## 2017-10-31 ENCOUNTER — Telehealth (HOSPITAL_BASED_OUTPATIENT_CLINIC_OR_DEPARTMENT_OTHER): Payer: Self-pay | Admitting: Naturopath

## 2017-10-31 DIAGNOSIS — D649 Anemia, unspecified: Secondary | ICD-10-CM

## 2017-10-31 NOTE — Telephone Encounter (Signed)
Patient Result Comments     Viewed by Darnelle Maffucci on 08/11/2017 9:24 AM   Written by Eldred Manges, ARNP on 08/11/2017 7:34 AM   Your complete blood count is abnormal. I would like to repeat it as well as do a few moe tests. Please follow-up for an appointment.      Pt calling to inquire about lab orders.  Routed to [name] to advise if she would like pt to have labs drawn before she is seen for an appt, or labs to be determined at appt.

## 2017-10-31 NOTE — Telephone Encounter (Addendum)
(  TEXTING IS AN OPTION FOR [facility] ONLY)  Is this a [facility] clinic? No      RETURN CALL: Detailed message on voicemail only - [phone]      SUBJECT:  Orders Request      VERBAL OR FAXED ORDER REQUEST: Internal  ORDERING PROVIDER: Eldred Manges, ARNP  ORDERS ARE FOR: Blood  ADDITIONAL INFORMATION: Patient states that [name] wanted her to repeat labs but no orders are in chart. Please advise once orders are in place so patient can go to lab.    Patient would also like to discuss swelling of feet when walking.

## 2017-11-01 NOTE — Telephone Encounter (Signed)
FYI to pt re message from PCP re labs. Left message on pt's private voice mail.

## 2017-11-01 NOTE — Telephone Encounter (Signed)
Labs are in.  It would be good if she could go to the lab a few days prior to appointment.

## 2017-11-10 ENCOUNTER — Ambulatory Visit (HOSPITAL_BASED_OUTPATIENT_CLINIC_OR_DEPARTMENT_OTHER): Payer: Medicare HMO

## 2017-11-21 ENCOUNTER — Ambulatory Visit: Payer: Self-pay | Admitting: Family Medicine

## 2017-11-21 VITALS — BP 140/74 | HR 72 | Resp 16 | Ht 63.0 in | Wt 186.0 lb

## 2017-11-21 DIAGNOSIS — Z0189 Encounter for other specified special examinations: Principal | ICD-10-CM

## 2017-11-21 DIAGNOSIS — Z008 Encounter for other general examination: Secondary | ICD-10-CM

## 2017-11-21 NOTE — Progress Notes (Signed)
Subjective: Annual biometrics screening  Patient presents for her annual biometric screening. Patient reports that she does not currently eat a well-rounded, healthy diet.  Patient's diet is high in carbohydrates.  Patient knows that she needs to change this and plans to in the near future.  Patient denies getting regular physical activity.  Patient does not currently have a primary care provider but sees her OB/GYN regularly.  Patient also sees cardiology regularly, reports a family history of hypertriglyceridemia.  Patient had a lipid panel on 06/01/2017, which will be used for her biometric screening.  The provider that ordered this testing discussed the results with the patient and recommended follow-up.  Patient has an upcoming follow-up appointment with cardiology within the next month, where this will be reassessed.  Patient denies wanting this redrawn today.  Patient's fasting blood sugar on 06/01/2017 was 95. PCP: None currently.  Patient works for [employer] Patient denies any other issues or concerns.   Review of Systems Unremarkable  Objective  Physical Exam General: Awake, alert and oriented. No acute distress. Well developed, hydrated and nourished. Appears stated age.  HEENT: Supple neck without adenopathy. Sclera is non-icteric. The ear canal is clear without discharge. The tympanic membrane is normal in appearance with normal landmarks and cone of light. Nasal mucosa is pink and moist. Oral mucosa is pink and moist. The pharynx is normal in appearance without tonsillar swelling or exudates.  Skin: Skin in warm, dry and intact without rashes or lesions. Appropriate color for ethnicity. Cardiac: Heart rate and rhythm are normal. No murmurs, gallops, or rubs are auscultated.  Respiratory: The chest wall is symmetric and without deformity. No signs of respiratory distress. Lung sounds are clear in all lobes bilaterally without rales, ronchi, or wheezes.  Neurological: The patient is awake,  alert and oriented to person, place, and time with normal speech.  Memory is normal and thought processes intact. No gait abnormalities are appreciated.  Psychiatric: Appropriate mood and affect.   Assessment Annual biometrics screening  Plan   Encouraged routine visits with primary care provider.  Provided patient with resources and encouraged her to establish care with a primary care provider within the next month. Patient's blood pressure is 140/74 today.  Discussed normal values.  Advised patient to monitor this regularly and report abnormal values to her primary care provider. Encouraged patient to get regular exercise and eat a healthy, well-rounded diet.

## 2017-11-23 ENCOUNTER — Ambulatory Visit (HOSPITAL_BASED_OUTPATIENT_CLINIC_OR_DEPARTMENT_OTHER): Payer: Medicare HMO

## 2017-12-05 ENCOUNTER — Telehealth (HOSPITAL_BASED_OUTPATIENT_CLINIC_OR_DEPARTMENT_OTHER): Payer: Self-pay | Admitting: Naturopath

## 2017-12-05 DIAGNOSIS — F419 Anxiety disorder, unspecified: Secondary | ICD-10-CM

## 2017-12-05 MED ORDER — FLUOXETINE HCL 20 MG OR CAPS
60.0000 mg | ORAL_CAPSULE | Freq: Every day | ORAL | 1 refills | Status: DC
Start: 2017-12-05 — End: 2018-06-01

## 2017-12-05 NOTE — Telephone Encounter (Signed)
Prescription was sent electronically to [facility] [phone]

## 2017-12-05 NOTE — Telephone Encounter (Signed)
Patient run out of Fluoxetine 20 mg.  She is requesting if this can be re-filled today. I will pend and route the request to [name] to authorize

## 2017-12-06 ENCOUNTER — Telehealth (HOSPITAL_BASED_OUTPATIENT_CLINIC_OR_DEPARTMENT_OTHER): Payer: Self-pay | Admitting: Naturopath

## 2017-12-06 DIAGNOSIS — R52 Pain, unspecified: Secondary | ICD-10-CM

## 2017-12-06 DIAGNOSIS — E7800 Pure hypercholesterolemia, unspecified: Secondary | ICD-10-CM

## 2017-12-06 MED ORDER — IBUPROFEN 600 MG OR TABS
ORAL_TABLET | ORAL | 0 refills | Status: DC
Start: 2017-12-06 — End: 2019-08-09

## 2017-12-06 MED ORDER — ATORVASTATIN CALCIUM 10 MG OR TABS
10.0000 mg | ORAL_TABLET | Freq: Every day | ORAL | 1 refills | Status: DC
Start: 2017-12-06 — End: 2018-06-01

## 2017-12-06 NOTE — Telephone Encounter (Signed)
Received refill request for Atorvastatin Calcium 10 MG Oral Tab and Ibuprofen 600 MG Oral Tab. Medications pended for [place] to authorize.

## 2017-12-06 NOTE — Telephone Encounter (Signed)
Prescriptions authorized and electronically sent to [facility].

## 2017-12-13 ENCOUNTER — Ambulatory Visit (HOSPITAL_BASED_OUTPATIENT_CLINIC_OR_DEPARTMENT_OTHER): Payer: Medicare HMO

## 2018-01-23 ENCOUNTER — Telehealth (HOSPITAL_BASED_OUTPATIENT_CLINIC_OR_DEPARTMENT_OTHER): Payer: Self-pay | Admitting: Naturopath

## 2018-01-23 ENCOUNTER — Ambulatory Visit: Payer: Self-pay

## 2018-01-23 NOTE — Telephone Encounter (Signed)
Reason for Disposition  . All other patients with chest pain    Protocols used: CHEST PAIN-ADULT-[place]    Pt calling to make an appt with [facility]. Diverted to [facility] for triage for chest pain. Pt says last night when she went to the bathroom had a few moments of chest tightness, seemingly near her hernia scar. No pain right now.

## 2018-01-23 NOTE — Telephone Encounter (Signed)
Per [facility] triage RN note:  "Pt calling to make an appt with [facility]. Diverted to CCL for triage for chest pain. Pt says last night when she went to the bathroom had a few moments of chest tightness, seemingly near her hernia scar. No pain right now."    Routed to [facility] pool with disposition "See Today in Office".    Called patient, left VM message to call 911 or go to ED for any current emergent symptoms. Otherwise, go to urgent care or call clinic so we can follow up. Also sent an ecare message.

## 2018-01-31 ENCOUNTER — Other Ambulatory Visit: Payer: Self-pay | Admitting: Obstetrics and Gynecology

## 2018-03-03 ENCOUNTER — Other Ambulatory Visit (HOSPITAL_BASED_OUTPATIENT_CLINIC_OR_DEPARTMENT_OTHER): Payer: Self-pay | Admitting: Naturopath

## 2018-03-15 ENCOUNTER — Other Ambulatory Visit (HOSPITAL_BASED_OUTPATIENT_CLINIC_OR_DEPARTMENT_OTHER): Payer: Self-pay | Admitting: Naturopath

## 2018-03-15 DIAGNOSIS — B372 Candidiasis of skin and nail: Secondary | ICD-10-CM

## 2018-03-15 MED ORDER — NYSTATIN 100000 UNIT/GM EX CREA
TOPICAL_CREAM | Freq: Two times a day (BID) | CUTANEOUS | 1 refills | Status: DC
Start: 2018-03-15 — End: 2018-04-24

## 2018-04-24 ENCOUNTER — Other Ambulatory Visit (HOSPITAL_BASED_OUTPATIENT_CLINIC_OR_DEPARTMENT_OTHER): Payer: Self-pay | Admitting: Naturopath

## 2018-04-24 DIAGNOSIS — B372 Candidiasis of skin and nail: Secondary | ICD-10-CM

## 2018-04-25 NOTE — Telephone Encounter (Signed)
This medication is outside of the [facility]'s protocols. Please sign and close the encounter if you approve:  Nystatin cream    If this medication is denied please have your staff inform the patient and schedule an appointment if necessary.

## 2018-04-26 MED ORDER — NYSTATIN 100000 UNIT/GM EX CREA
TOPICAL_CREAM | CUTANEOUS | 0 refills | Status: DC
Start: 2018-04-26 — End: 2018-12-08

## 2018-05-03 ENCOUNTER — Encounter (HOSPITAL_BASED_OUTPATIENT_CLINIC_OR_DEPARTMENT_OTHER): Payer: Self-pay | Admitting: Naturopath

## 2018-05-03 DIAGNOSIS — Z1231 Encounter for screening mammogram for malignant neoplasm of breast: Secondary | ICD-10-CM

## 2018-05-09 ENCOUNTER — Ambulatory Visit (HOSPITAL_BASED_OUTPATIENT_CLINIC_OR_DEPARTMENT_OTHER): Payer: Medicare HMO | Admitting: Naturopath

## 2018-05-09 ENCOUNTER — Ambulatory Visit (HOSPITAL_BASED_OUTPATIENT_CLINIC_OR_DEPARTMENT_OTHER): Payer: Medicare HMO

## 2018-05-11 ENCOUNTER — Ambulatory Visit (HOSPITAL_BASED_OUTPATIENT_CLINIC_OR_DEPARTMENT_OTHER): Payer: Medicare HMO | Admitting: Naturopath

## 2018-05-12 ENCOUNTER — Other Ambulatory Visit: Payer: Self-pay

## 2018-05-24 ENCOUNTER — Ambulatory Visit: Payer: Managed Care, Other (non HMO) | Admitting: Emergency Medicine

## 2018-05-24 VITALS — BP 146/86

## 2018-05-24 DIAGNOSIS — Z0189 Encounter for other specified special examinations: Principal | ICD-10-CM

## 2018-05-24 DIAGNOSIS — Z008 Encounter for other general examination: Secondary | ICD-10-CM

## 2018-05-24 NOTE — Progress Notes (Signed)
  Subjective:     Patient ID: [name], female   DOB: 10-Sep-1965, 52 y.o.   MRN: [id number]  Here for annual biometric screening. She does not have a PCP.  Past medical history: Reviewed in chart.  She had vaginal hysterectomy April 2019 and has recovered well and follows up with her OB/GYN. Hypertriglyceridemia. Saw a cardiologist this year for atypical chest pain with negative work-up.  Currently, review of systems negative except she is concerned about weight gain.  Some fatigue.      Objective:   Physical Exam Pleasant female, no distress.  Alert, cooperative.  Blood pressure 146/86, reviewed vital signs and nursing note-weight 196, and we discussed.  Other vital signs recorded on biometric screening hardcopy.-We discussed today. HEENT: Within normal limits Neck supple, no adenopathy or thyromegaly or masses Heart: Regular rate and rhythm without murmur gallop or rub Lungs: Clear to auscultation  Assessment:     Biometric screening. History hypertriglyceridemia.    Plan:     I advised her to establish with PCP to completely evaluate and work-up fatigue and weight gain. Discussed healthy eating habits and regular exercise and weight loss and follow-up with PCP. Routine biometric screening labs ordered, including fasting lipid panel and glucose. I offered to draw other labs such as CMP and TSH and CBC, but she declined any other testing at this time. We attempted blood draw, but unsuccessful today, and patient states that she usually is extremely difficult blood draw.-Therefore, we completed forms which I signed for [facility] order sheet for [facility] to draw fasting lipid and glucose. She states that BP has normally been within normal limits, and that it might be white coat syndrome, but I advised her to monitor BP and follow-up with PCP

## 2018-05-29 ENCOUNTER — Other Ambulatory Visit: Payer: Self-pay | Admitting: Family Medicine

## 2018-05-29 ENCOUNTER — Other Ambulatory Visit: Payer: Self-pay | Admitting: General Surgery

## 2018-05-30 LAB — LIPID PANEL WITH LDL/HDL RATIO
Cholesterol, Total: 254 mg/dL — ABNORMAL HIGH (ref 100–199)
HDL: 39 mg/dL — ABNORMAL LOW (ref >39)
LDL CALC: 157 mg/dL — AB (ref 0–99)
LDl/HDL Ratio: 4 ratio — ABNORMAL HIGH (ref 0.0–3.2)
Triglycerides: 289 mg/dL — ABNORMAL HIGH (ref 0–149)
VLDL Cholesterol Cal: 58 mg/dL — ABNORMAL HIGH (ref 5–40)

## 2018-05-30 LAB — GLUCOSE, RANDOM: GLUCOSE: 100 mg/dL — AB (ref 65–99)

## 2018-05-31 ENCOUNTER — Other Ambulatory Visit (HOSPITAL_BASED_OUTPATIENT_CLINIC_OR_DEPARTMENT_OTHER): Payer: Self-pay | Admitting: Naturopath

## 2018-05-31 DIAGNOSIS — F419 Anxiety disorder, unspecified: Secondary | ICD-10-CM

## 2018-05-31 DIAGNOSIS — E7800 Pure hypercholesterolemia, unspecified: Secondary | ICD-10-CM

## 2018-06-01 MED ORDER — FLUOXETINE HCL 20 MG OR CAPS
ORAL_CAPSULE | ORAL | 0 refills | Status: DC
Start: 2018-06-01 — End: 2018-07-28

## 2018-06-01 MED ORDER — ATORVASTATIN CALCIUM 10 MG OR TABS
ORAL_TABLET | ORAL | 0 refills | Status: DC
Start: 2018-06-01 — End: 2018-07-28

## 2018-06-01 NOTE — Telephone Encounter (Signed)
Patient is due for yearly exam.  One refill authorized.  Please reschedule follow up visit.

## 2018-06-09 NOTE — Telephone Encounter (Signed)
Called and left patient a message to schedule annual exam appt.

## 2018-06-19 ENCOUNTER — Other Ambulatory Visit (HOSPITAL_BASED_OUTPATIENT_CLINIC_OR_DEPARTMENT_OTHER): Payer: Self-pay | Admitting: Naturopath

## 2018-06-19 DIAGNOSIS — F331 Major depressive disorder, recurrent, moderate: Secondary | ICD-10-CM

## 2018-06-22 MED ORDER — BUPROPION HCL ER (SR) 150 MG OR TB12
150.0000 mg | EXTENDED_RELEASE_TABLET | Freq: Two times a day (BID) | ORAL | 0 refills | Status: DC
Start: 2018-06-22 — End: 2018-07-28

## 2018-06-22 NOTE — Telephone Encounter (Signed)
Left message for patient to schedule yearly exam.

## 2018-06-22 NOTE — Telephone Encounter (Signed)
Patient is due for yearly exam.  One refill authorized.  Please schedule follow up visit.

## 2018-06-29 ENCOUNTER — Ambulatory Visit (HOSPITAL_BASED_OUTPATIENT_CLINIC_OR_DEPARTMENT_OTHER): Payer: Medicare HMO | Admitting: Naturopath

## 2018-07-06 ENCOUNTER — Encounter (HOSPITAL_BASED_OUTPATIENT_CLINIC_OR_DEPARTMENT_OTHER): Payer: Medicare HMO | Admitting: Naturopath

## 2018-07-11 ENCOUNTER — Encounter (HOSPITAL_BASED_OUTPATIENT_CLINIC_OR_DEPARTMENT_OTHER): Payer: Medicare HMO | Admitting: Naturopath

## 2018-07-11 ENCOUNTER — Telehealth (HOSPITAL_BASED_OUTPATIENT_CLINIC_OR_DEPARTMENT_OTHER): Payer: Self-pay | Admitting: Naturopath

## 2018-07-11 NOTE — Telephone Encounter (Signed)
[employer] form was received today.  I reviewed with [name]  For the Mode Exception Questionnaire.  Patient have cancelled her appt with [name]:   09/20/17  05/09/18  05/11/18  06/29/18  07/06/18  07/11/18  Patient made another appt on 07/28/2018. Per Lisabeth Pick, she will fill out and sign the form after her visit on 07/28/2018.  I relayed the message to patient on her designated voicemail.

## 2018-07-14 ENCOUNTER — Ambulatory Visit: Payer: Medicare HMO | Attending: Naturopath

## 2018-07-14 DIAGNOSIS — Z1231 Encounter for screening mammogram for malignant neoplasm of breast: Secondary | ICD-10-CM | POA: Insufficient documentation

## 2018-07-28 ENCOUNTER — Ambulatory Visit: Payer: Medicare HMO | Attending: Naturopath | Admitting: Naturopath

## 2018-07-28 ENCOUNTER — Encounter (HOSPITAL_BASED_OUTPATIENT_CLINIC_OR_DEPARTMENT_OTHER): Payer: Self-pay | Admitting: Naturopath

## 2018-07-28 VITALS — BP 140/81 | HR 68 | Wt 370.0 lb

## 2018-07-28 DIAGNOSIS — L719 Rosacea, unspecified: Secondary | ICD-10-CM | POA: Insufficient documentation

## 2018-07-28 DIAGNOSIS — E782 Mixed hyperlipidemia: Secondary | ICD-10-CM | POA: Insufficient documentation

## 2018-07-28 DIAGNOSIS — I10 Essential (primary) hypertension: Secondary | ICD-10-CM | POA: Insufficient documentation

## 2018-07-28 DIAGNOSIS — R011 Cardiac murmur, unspecified: Secondary | ICD-10-CM | POA: Insufficient documentation

## 2018-07-28 DIAGNOSIS — B372 Candidiasis of skin and nail: Secondary | ICD-10-CM | POA: Insufficient documentation

## 2018-07-28 DIAGNOSIS — Z0000 Encounter for general adult medical examination without abnormal findings: Secondary | ICD-10-CM | POA: Insufficient documentation

## 2018-07-28 DIAGNOSIS — F419 Anxiety disorder, unspecified: Secondary | ICD-10-CM | POA: Insufficient documentation

## 2018-07-28 DIAGNOSIS — N3941 Urge incontinence: Secondary | ICD-10-CM | POA: Insufficient documentation

## 2018-07-28 DIAGNOSIS — Z131 Encounter for screening for diabetes mellitus: Secondary | ICD-10-CM | POA: Insufficient documentation

## 2018-07-28 DIAGNOSIS — Z6841 Body Mass Index (BMI) 40.0 and over, adult: Secondary | ICD-10-CM

## 2018-07-28 DIAGNOSIS — L309 Dermatitis, unspecified: Secondary | ICD-10-CM | POA: Insufficient documentation

## 2018-07-28 DIAGNOSIS — R7989 Other specified abnormal findings of blood chemistry: Secondary | ICD-10-CM | POA: Insufficient documentation

## 2018-07-28 DIAGNOSIS — E7800 Pure hypercholesterolemia, unspecified: Secondary | ICD-10-CM | POA: Insufficient documentation

## 2018-07-28 DIAGNOSIS — Z1211 Encounter for screening for malignant neoplasm of colon: Secondary | ICD-10-CM | POA: Insufficient documentation

## 2018-07-28 DIAGNOSIS — F331 Major depressive disorder, recurrent, moderate: Secondary | ICD-10-CM | POA: Insufficient documentation

## 2018-07-28 LAB — COMPREHENSIVE METABOLIC PANEL
ALT (GPT): 14 U/L (ref 7–33)
AST (GOT): 16 U/L (ref 9–38)
Albumin: 3.9 g/dL (ref 3.5–5.2)
Alkaline Phosphatase (Total): 91 U/L (ref 34–121)
Anion Gap: 8 (ref 4–12)
Bilirubin (Total): 0.4 mg/dL (ref 0.2–1.3)
Calcium: 9.2 mg/dL (ref 8.9–10.2)
Carbon Dioxide, Total: 29 meq/L (ref 22–32)
Chloride: 102 meq/L (ref 98–108)
Creatinine: 0.67 mg/dL (ref 0.38–1.02)
GFR, Calc, African American: >60 mL/min/{1.73_m2} (ref >59)
GFR, Calc, European American: >60 mL/min/{1.73_m2} (ref >59)
Glucose: 108 mg/dL (ref 62–125)
Potassium: 4.1 meq/L (ref 3.6–5.2)
Protein (Total): 7.6 g/dL (ref 6.0–8.2)
Sodium: 139 meq/L (ref 135–145)
Urea Nitrogen: 14 mg/dL (ref 8–21)

## 2018-07-28 LAB — CBC, DIFF
% Basophils: 0 %
% Eosinophils: 2 %
% Immature Granulocytes: 1 %
% Lymphocytes: 32 %
% Monocytes: 5 %
% Neutrophils: 60 %
% Nucleated RBC: 0 %
Absolute Eosinophil Count: 0.23 10*3/uL (ref 0.00–0.50)
Absolute Lymphocyte Count: 3.8 10*3/uL (ref 1.00–4.80)
Basophils: 0.05 10*3/uL (ref 0.00–0.20)
Hematocrit: 34 % — ABNORMAL LOW (ref 36–45)
Hemoglobin: 10.5 g/dL — ABNORMAL LOW (ref 11.5–15.5)
Immature Granulocytes: 0.06 10*3/uL — ABNORMAL HIGH (ref 0.00–0.05)
MCH: 25.4 pg — ABNORMAL LOW (ref 27.3–33.6)
MCHC: 30.5 g/dL — ABNORMAL LOW (ref 32.2–36.5)
MCV: 83 fL (ref 81–98)
Monocytes: 0.63 10*3/uL (ref 0.00–0.80)
Neutrophils: 6.99 10*3/uL (ref 1.80–7.00)
Nucleated RBC: 0 10*3/uL
Platelet Count: 302 10*3/uL (ref 150–400)
RBC: 4.14 10*6/uL (ref 3.80–5.00)
RDW-CV: 17.1 % — ABNORMAL HIGH (ref 11.6–14.4)
WBC: 11.76 10*3/uL — ABNORMAL HIGH (ref 4.3–10.0)

## 2018-07-28 LAB — CHOLESTEROL (TOTAL & HDL)
Cholesterol/HDL Ratio: 3
HDL Cholesterol: 53 mg/dL (ref >39)
Total Cholesterol: 160 mg/dL (ref <200)

## 2018-07-28 LAB — HEMOGLOBIN A1C, RAPID: Hemoglobin A1C: 6.3 % — ABNORMAL HIGH (ref 4.0–6.0)

## 2018-07-28 MED ORDER — METRONIDAZOLE 0.75 % EX GEL
Freq: Two times a day (BID) | CUTANEOUS | 3 refills | Status: DC
Start: 2018-07-28 — End: 2020-12-23

## 2018-07-28 MED ORDER — ATORVASTATIN CALCIUM 10 MG OR TABS
10.0000 mg | ORAL_TABLET | Freq: Every day | ORAL | 3 refills | Status: DC
Start: 2018-07-28 — End: 2018-09-06

## 2018-07-28 MED ORDER — BUPROPION HCL ER (SR) 150 MG OR TB12
150.0000 mg | EXTENDED_RELEASE_TABLET | Freq: Two times a day (BID) | ORAL | 3 refills | Status: DC
Start: 2018-07-28 — End: 2018-09-14

## 2018-07-28 MED ORDER — OXYBUTYNIN CHLORIDE 5 MG OR TABS
5.0000 mg | ORAL_TABLET | Freq: Two times a day (BID) | ORAL | 12 refills | Status: DC
Start: 2018-07-28 — End: 2019-02-22

## 2018-07-28 MED ORDER — FLUOXETINE HCL 20 MG OR CAPS
60.0000 mg | ORAL_CAPSULE | Freq: Every day | ORAL | 3 refills | Status: DC
Start: 2018-07-28 — End: 2019-02-22

## 2018-07-28 MED ORDER — TRIAMCINOLONE ACETONIDE 0.1 % EX CREA
TOPICAL_CREAM | Freq: Two times a day (BID) | CUTANEOUS | 3 refills | Status: AC
Start: 2018-07-28 — End: ?

## 2018-07-28 MED ORDER — AMLODIPINE BESYLATE 5 MG OR TABS
5.0000 mg | ORAL_TABLET | Freq: Every day | ORAL | 3 refills | Status: DC
Start: 2018-07-28 — End: 2018-09-06

## 2018-07-28 MED ORDER — CLOTRIMAZOLE 1 % EX CREA
TOPICAL_CREAM | Freq: Two times a day (BID) | CUTANEOUS | 2 refills | Status: DC
Start: 2018-07-28 — End: 2022-02-02

## 2018-07-28 NOTE — Patient Instructions (Signed)
Follow-up appointment 2:30 pm 2/12  Start Amlodipine for blood pressure  Make sure you get your lab work done today   Work on increasing activity
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
## 2018-07-31 LAB — PATHOLOGISTS REVIEW: Pathologist Review: NORMAL

## 2018-08-02 ENCOUNTER — Encounter (HOSPITAL_BASED_OUTPATIENT_CLINIC_OR_DEPARTMENT_OTHER): Payer: Medicare HMO | Admitting: Naturopath

## 2018-08-04 ENCOUNTER — Other Ambulatory Visit (HOSPITAL_BASED_OUTPATIENT_CLINIC_OR_DEPARTMENT_OTHER): Payer: Self-pay | Admitting: Naturopath

## 2018-08-04 DIAGNOSIS — D72829 Elevated white blood cell count, unspecified: Secondary | ICD-10-CM

## 2018-08-04 DIAGNOSIS — D649 Anemia, unspecified: Secondary | ICD-10-CM

## 2018-08-12 ENCOUNTER — Other Ambulatory Visit (HOSPITAL_BASED_OUTPATIENT_CLINIC_OR_DEPARTMENT_OTHER): Payer: Self-pay | Admitting: Internal Medicine

## 2018-08-12 DIAGNOSIS — D649 Anemia, unspecified: Secondary | ICD-10-CM

## 2018-08-12 DIAGNOSIS — D72821 Monocytosis (symptomatic): Secondary | ICD-10-CM

## 2018-08-12 DIAGNOSIS — D729 Disorder of white blood cells, unspecified: Secondary | ICD-10-CM

## 2018-08-12 NOTE — Progress Notes (Signed)
Reason for eConsult:  Eldred Manges, ARNP submitted the following request:  NOTE: Please ensure all studies listed in this template are complete before ordering an eConsult.   I am requesting an eConsult for this 53 year old female with anemia.    My clinical question is: What further evaluation is recommended for anemia and leokocytosis intermittently since 2005  The following studies are required and available in [facility] or [facility]:    - CBC with differential, Reticulocyte count, iron, ferritin, TIBC   - Vitamin B12 (and methyl malonic acid if B12 low or if MCV > 100), folate,   - LDH, Creatinine, LFTs.  Lab Results       Component                Value               Date                       WBC                      11.76               07/28/2018                 RBC                      4.14                07/28/2018                 HEMOGLOBIN               10.5                07/28/2018                 HEMATOCRIT               34                  07/28/2018                 MCH                      25.4                07/28/2018                 MCHC                     30.5                07/28/2018                 RDWCV                    17.1                07/28/2018                 PERNEUT                  60                  07/28/2018                 PERLYMPH  32                  07/28/2018                 PERMONO                  5                   07/28/2018                 PEREOS                   2                   07/28/2018                 PERBASO                  0                   07/28/2018                 ANEUT                    6.99                07/28/2018                 ALYMPH                   3.80                07/28/2018                 AEOS                     0.23                07/28/2018                 ABASO                    0.05                07/28/2018                 AIMG                     0.06                07/28/2018                 [facility]                                      04/28/2017             See CBC - no additional findings       PLTMORPH                                     04/28/2017             See CBC - no additional findings  No results found for: RETICP, RETABS  No results found for: IRON, TIBC, FE  No  results found for: IRON, FERRITIN, TIBC  No results found for: VITB12, FOLATE  No results found for: LDH  Lab Results       Component                Value               Date                       CREATININE               0.67                07/28/2018             Lab Results       Component                Value               Date                       ALT                      14                  07/28/2018                 AST                      16                  07/28/2018                 ALK                      91                  07/28/2018                 ALBUMIN                  3.9                 07/28/2018                 PROTEIN                  7.6                 07/28/2018            If this clinical question is deemed too complex for eConsult, please  route back to me and I will discuss further with the patient.  Amado Nash, ARNP  08/04/2018  _____________________________________________________________________  After careful review of the patient's results above and the patient's information available in the medical record,  the following are my findings and recommendations:  Jeannene Patella, MD  08/12/2018    Given more than one cell line is affected in 53 yo F with progressive normocytic anemia and intermittent neutropenia and monocytosis, I would consider checking basic anemia work up (folate, vitamin B12, iron panel, retic count) and referring to Heme clinic for additional workup.    I spent a total time of 8 minutes reviewing and communicating the above findings and recommendations.    "This eConsult is based solely on the  clinical information available to me in the patient's medical record and is provided without  benefit of a comprehensive evaluation or physical examination of the patient. The information contained in this eConsult must be interpreted in light of any clinical issues or changes in patient status that were not known to me at the time this eConsult was completed. You must rely on your own informed clinical judgment for decision making. If necessary, we can schedule the patient for an in-office consultation."

## 2018-08-17 ENCOUNTER — Ambulatory Visit: Payer: Medicare HMO | Attending: Cardiovascular Disease

## 2018-08-17 ENCOUNTER — Other Ambulatory Visit (HOSPITAL_COMMUNITY): Payer: Self-pay

## 2018-08-17 DIAGNOSIS — R011 Cardiac murmur, unspecified: Secondary | ICD-10-CM | POA: Insufficient documentation

## 2018-08-22 ENCOUNTER — Encounter (HOSPITAL_BASED_OUTPATIENT_CLINIC_OR_DEPARTMENT_OTHER): Payer: Medicare HMO | Admitting: Naturopath

## 2018-08-31 ENCOUNTER — Encounter (HOSPITAL_BASED_OUTPATIENT_CLINIC_OR_DEPARTMENT_OTHER): Payer: Medicare HMO | Admitting: Naturopath

## 2018-09-05 ENCOUNTER — Telehealth (HOSPITAL_BASED_OUTPATIENT_CLINIC_OR_DEPARTMENT_OTHER): Payer: Self-pay | Admitting: Naturopath

## 2018-09-05 DIAGNOSIS — L309 Dermatitis, unspecified: Secondary | ICD-10-CM

## 2018-09-05 NOTE — Telephone Encounter (Signed)
Pt called. Requesting a prescription for an Ointment for itch on leg and arms due to dry skin.      Pharmacy     [facility] [phone] [place] [phone]

## 2018-09-06 ENCOUNTER — Ambulatory Visit: Payer: Medicare HMO | Attending: Naturopath | Admitting: Naturopath

## 2018-09-06 VITALS — BP 137/81 | Wt 370.0 lb

## 2018-09-06 DIAGNOSIS — R21 Rash and other nonspecific skin eruption: Secondary | ICD-10-CM | POA: Insufficient documentation

## 2018-09-06 DIAGNOSIS — Z6841 Body Mass Index (BMI) 40.0 and over, adult: Secondary | ICD-10-CM

## 2018-09-06 DIAGNOSIS — B86 Scabies: Secondary | ICD-10-CM | POA: Insufficient documentation

## 2018-09-06 DIAGNOSIS — E7800 Pure hypercholesterolemia, unspecified: Secondary | ICD-10-CM | POA: Insufficient documentation

## 2018-09-06 DIAGNOSIS — R7301 Impaired fasting glucose: Secondary | ICD-10-CM | POA: Insufficient documentation

## 2018-09-06 DIAGNOSIS — I10 Essential (primary) hypertension: Secondary | ICD-10-CM | POA: Insufficient documentation

## 2018-09-06 DIAGNOSIS — D649 Anemia, unspecified: Secondary | ICD-10-CM | POA: Insufficient documentation

## 2018-09-06 LAB — IRON BINDING CAPACITY (W/IRON, TRANSFERRIN & TRANSF SAT)
Iron, SRM: 26 ug/dL — ABNORMAL LOW (ref 31–171)
Total Iron Binding Capacity: 375 ug/dL (ref 270–535)
Transferrin Saturation: 7 % — ABNORMAL LOW (ref 10–45)
Transferrin: 268 mg/dL (ref 192–382)

## 2018-09-06 LAB — RETICULOCYTE COUNT
Absolute Reticulocyte Count: 86 10*9/L (ref 25–125)
RETIC HEMOGLOBIN EQUIVALENT: 23.3 pg — ABNORMAL LOW (ref 28.0–38.0)
Reticulocyte Count, %: 2.2 % (ref 0.5–2.5)

## 2018-09-06 LAB — FOLATE & VITAMIN B12
Folate, SRM: 6.5 ng/mL (ref >5.8)
Vitamin B12 (Cobalamin): 242 pg/mL (ref 180–914)

## 2018-09-06 MED ORDER — METFORMIN HCL 500 MG OR TABS
ORAL_TABLET | ORAL | 4 refills | Status: DC
Start: 2018-09-06 — End: 2019-02-22

## 2018-09-06 MED ORDER — PERMETHRIN 5 % EX CREA
TOPICAL_CREAM | CUTANEOUS | 0 refills | Status: DC
Start: 2018-09-06 — End: 2018-12-27

## 2018-09-06 MED ORDER — ATORVASTATIN CALCIUM 20 MG OR TABS
20.0000 mg | ORAL_TABLET | Freq: Every day | ORAL | 3 refills | Status: DC
Start: 2018-09-06 — End: 2019-02-22

## 2018-09-06 MED ORDER — AMLODIPINE BESYLATE 10 MG OR TABS
10.0000 mg | ORAL_TABLET | Freq: Every day | ORAL | 3 refills | Status: DC
Start: 2018-09-06 — End: 2019-02-22

## 2018-09-06 NOTE — Patient Instructions (Addendum)
Medication changes:    1.  Start Metformin for blood sugar reduction  500 mg daily at night for 5 days, then increase to 500 mg twice daily for two weeks, then increase to 500 mg in the morning and 1000 mg at night.  In one week increase to 1000 mg twice daily  Follow-up 3 months fasting (nothing but water for 10 hours)    2.  Increase Amlodipine to 10 mg daily      3.  Increase Atorvastatin to 20 mg daily       For your skin:    One treatment of Permethrin  just in case it is scabies.  Continue Triamcinolone three times daily and apply Vaseline or barrier cream over over triamcinolone.  Use Mupirocin cream three times daily on open lesions.   Follow-up in one week    I will discuss with Cardiology to see if they would like you to be seen in clinic.  If needed they will call you to schedule    You will get a call to schedule with Hematology

## 2018-09-07 DIAGNOSIS — R7303 Prediabetes: Secondary | ICD-10-CM | POA: Insufficient documentation

## 2018-09-07 DIAGNOSIS — I272 Pulmonary hypertension, unspecified: Secondary | ICD-10-CM | POA: Insufficient documentation

## 2018-09-07 MED ORDER — MUPIROCIN 2 % EX OINT
TOPICAL_OINTMENT | CUTANEOUS | 0 refills | Status: DC
Start: 2018-09-07 — End: 2023-04-26

## 2018-09-07 NOTE — Progress Notes (Signed)
SUBJECTIVE:   Holly Blair is a 53 year old female patient who presents with the following concern:     Chief Complaint: lab result review    She is here to review recent lab results.   HgA1C: 6.3  CBC: leukocytosis and anemia   Path review: Mild leukocytosis without normal morphologic features  Cholesterol: 160, 53  Echocardiogram: Normal LV size and systolic function (EF 09%). No regional wall motion abnormalities.  Normal RV size and function.   Trace to mild aortic insufficiency. Mild-moderate aortic stenosis. Otherwise normal valvular function.   Mild pulmonary hypertension. Estimated PASP 39-8mmHg based on an estimated right atrial pressure of 5-31mmHg.    She is taking 10 mg Atorvastatin and 5 mg Amlodipine daily.  She denies chest pain, SOB, palpitations, increased thirst, urinary frequency.  She does have lower extremity swelling (biilateral) and a rash on her arms and legs.    Rash started a couple weeks ago and is limited to her forearms, hands, thighs and lower legs. She describes it as an itchy red rash. No changes in body or household products.  No recent travel.  She has only slept at home.  No fever.     Review of Systems   Constitution: Negative for chills, fever and malaise/fatigue.   Cardiovascular: Positive for leg swelling. Negative for chest pain, irregular heartbeat and palpitations.   Respiratory: Negative for cough, shortness of breath and wheezing.    Endocrine: Negative for polydipsia, polyphagia and polyuria.   Skin: Positive for color change, dry skin, itching and rash.   Musculoskeletal: Negative for joint pain and joint swelling.       Patient Active Problem List   Diagnosis   . Depression, major, recurrent ([place])   . Chronic knee pain   . Chronic back pain   . Senile nuclear cataract   . Myopia with astigmatism and presbyopia   . Mental developmental delay   . Morbid obesity with BMI of 50.0-59.9, adult ([place])   . Hyperlipidemia   . Systolic murmur   . Anxiety   . Rosacea   . Essential  hypertension       Current Outpatient Medications   Medication Sig Dispense Refill   . Acetaminophen 500 MG Oral Tab Take 1 tablet (500 mg) by mouth every 6 hours as needed for pain. (Patient taking differently: Take 500 mg by mouth every 6 hours as needed for pain. ) 60 tablet 0   . amLODIPine 10 MG tablet Take 1 tablet (10 mg) by mouth daily. 90 tablet 3   . atorvastatin 20 MG tablet Take 1 tablet (20 mg) by mouth daily. 90 tablet 3   . buPROPion SR 150 MG 12 hr tablet Take 1 tablet (150 mg) by mouth 2 times a day. 180 tablet 3   . clotrimazole 1 % cream Apply to affected area on abdomen 2 times a day. For fungal rash. 45 g 2   . FLUoxetine 20 MG capsule Take 3 capsules (60 mg) by mouth daily. 270 capsule 3   . Ibuprofen 600 MG Oral Tab take 1 tablet by mouth every 6 hours with food AND PLENTY OF WATER if needed for pain 90 tablet 0   . metFORMIN 500 MG tablet 500 mg daily at night for 5 days, increase to 500 mg twice daily, in two weeks increase to 500 mg in the morning and 1000 mg at night.  In one week increase to 1000 mg twice daily 120 tablet 4   .  metroNIDAZOLE 0.75 % topical gel Apply to affected area on face 2 times a day. 1 Tube 3   . NYSTATIN 100000 UNIT/GM cream Apply To the affected areas under breasts twice daily as directed 30 g 0   . oxybutynin 5 MG tablet Take 1 tablet (5 mg) by mouth 2 times a day. Take up to 3 times a day 90 tablet 12   . permethrin 5 % cream Apply from head to feet. Leave on for 8-14 hours, then wash off with soap and water. May reapply in 1 week if live mites appear. 60 g 0   . triamcinolone 0.1 % cream Apply to affected area on leg(s) 2 times a day. 1 Tube 3     No current facility-administered medications for this visit.        Review of patient's allergies indicates:  No Known Allergies    OBJECTIVE:  General: Alert, no acute distress   BP 137/81   Wt (!) 370 lb (167.8 kg)   BMI 62.05 kg/m   General appearance: healthy, alert, no distress  Mood/affect: Pleasant female.  Mood and affect appropriate.  Skin: multiple small, erythematous papules scattered on upper thighs, ankles, feet, hands and forearms.  Several papules are are excoriated on legs and ankles      ASSESSMENT/PLAN:    Holly Blair was seen today for follow up visit.    Diagnoses and all orders for this visit:    Impaired fasting blood sugar  Start Metformin .  See patient instructions.  Follow-up three months for lab testing   -     metFORMIN 500 MG tablet; 500 mg daily at night for 5 days, increase to 500 mg twice daily, in two weeks increase to 500 mg in the morning and 1000 mg at night.  In one week increase to 1000 mg twice daily  -     Glucose, Fasting  -     HEMOGLOBIN A1C, RAPID    Rash  Visible appearance is consistent with Scabies  -     permethrin 5 % cream; Apply from head to feet. Leave on for 8-14 hours, then wash off with soap and water. May reapply in 1 week if live mites appear.  -     mupirocin 2 % ointment; Apply to open lesions on lower legs and ankles three times daily on open lesions for prevention of secondary infection  Follow-up one week    High cholesterol  Increase dose of Atorvastatin with echocardiogram findings of  Mild-moderate aortic stenosis and mild pulmonary HTN  -     atorvastatin 20 MG tablet; Take 1 tablet (20 mg) by mouth daily.  -     Lipid Panel; Future  Future Plan: establish with Cardiology after COVID outbreak    Essential hypertension  -     amLODIPine 10 MG tablet; Take 1 tablet (10 mg) by mouth daily.    Anemia, unspecified type  -     Folate and Vitamin B12  -     Iron Binding Capacity and Total Iron  -     Reticulocyte Count  -     REFERRAL TO HEMATOLOGY      Patient Instructions   Medication changes:    1.  Start Metformin for blood sugar reduction  500 mg daily at night for 5 days, then increase to 500 mg twice daily for two weeks, then increase to 500 mg in the morning and 1000 mg at night.  In one week  increase to 1000 mg twice daily  Follow-up 3 months fasting (nothing but  water for 10 hours)    2.  Increase Amlodipine to 10 mg daily    3.  Increase Atorvastatin to 20 mg daily       For your skin:    One treatment of Permethrin  just in case it is scabies.  Continue Triamcinolone three times daily and apply Vaseline or barrier cream over over triamcinolone.  Use Mupirocin cream three times daily on open lesions.   Follow-up in one week    I will discuss with Cardiology to see if they would like you to be seen in clinic.  If needed they will call you to schedule    You will get a call to schedule with Hematology       Greater than 50% of this visit was spent face to face with patient in counseling,   coordination of care,  discussing treatment options, and  education regarding above problems. Total time spent was over 32 minutes.

## 2018-09-08 NOTE — Telephone Encounter (Signed)
Saw patient in clinic

## 2018-09-14 ENCOUNTER — Other Ambulatory Visit (HOSPITAL_BASED_OUTPATIENT_CLINIC_OR_DEPARTMENT_OTHER): Payer: Self-pay | Admitting: Naturopath

## 2018-09-14 DIAGNOSIS — F331 Major depressive disorder, recurrent, moderate: Secondary | ICD-10-CM

## 2018-09-14 MED ORDER — BUPROPION HCL ER (SR) 150 MG OR TB12
150.0000 mg | EXTENDED_RELEASE_TABLET | Freq: Two times a day (BID) | ORAL | 3 refills | Status: DC
Start: 2018-09-14 — End: 2018-10-26

## 2018-09-15 ENCOUNTER — Telehealth (HOSPITAL_BASED_OUTPATIENT_CLINIC_OR_DEPARTMENT_OTHER): Payer: Self-pay | Admitting: Naturopath

## 2018-09-15 DIAGNOSIS — D729 Disorder of white blood cells, unspecified: Secondary | ICD-10-CM

## 2018-09-15 NOTE — Telephone Encounter (Signed)
Patient says she needs blood work done also before her appt in June with [facility].

## 2018-09-15 NOTE — Telephone Encounter (Signed)
I can examine her skin with telehealth if she has a computer or phone she can use

## 2018-09-15 NOTE — Telephone Encounter (Signed)
Exie Parody from [facility] calling about patient's Hematology referral. Provider need some lab orders before her appt in June.    Creatinine level

## 2018-09-15 NOTE — Telephone Encounter (Signed)
Called pt to schedule telehealth visit as per Don Broach last note. If she calls back please schedule for her.

## 2018-09-15 NOTE — Telephone Encounter (Signed)
Can I have Elan or a nurse approve this appt to be seen in person? Pt says that it is for a rash on her arms and legs. She said Elan wanted to f/u in person.  If not, I can always change it to telehealth. But want to make sure Elan and correctly examine the rash through video.

## 2018-09-15 NOTE — Telephone Encounter (Signed)
Copied from last note 09/06/2018:    "One treatment of Permethrin  just in case it is scabies.  Continue Triamcinolone three times daily and apply Vaseline or barrier cream over over triamcinolone.  Use Mupirocin cream three times daily on open lesions.   Follow-up in one week"    Plan: routed to [facility], waiting for advice on in-person visit or telehealth visit. Will contact patient once decided.

## 2018-09-15 NOTE — Telephone Encounter (Signed)
Creatinine Lab pended.

## 2018-09-18 NOTE — Telephone Encounter (Signed)
Ecared pt letting her know a lab test was ordered.

## 2018-09-18 NOTE — Telephone Encounter (Signed)
Lab ordered.

## 2018-09-22 ENCOUNTER — Encounter (HOSPITAL_BASED_OUTPATIENT_CLINIC_OR_DEPARTMENT_OTHER): Payer: Self-pay | Admitting: Naturopath

## 2018-09-22 ENCOUNTER — Encounter (HOSPITAL_BASED_OUTPATIENT_CLINIC_OR_DEPARTMENT_OTHER): Payer: Medicare HMO | Admitting: Naturopath

## 2018-09-22 ENCOUNTER — Telehealth (HOSPITAL_BASED_OUTPATIENT_CLINIC_OR_DEPARTMENT_OTHER): Payer: Medicare HMO | Admitting: Naturopath

## 2018-10-04 ENCOUNTER — Ambulatory Visit: Payer: Medicare HMO | Admitting: Internal Medicine

## 2018-10-04 ENCOUNTER — Telehealth (HOSPITAL_BASED_OUTPATIENT_CLINIC_OR_DEPARTMENT_OTHER): Payer: Self-pay | Admitting: Naturopath

## 2018-10-04 ENCOUNTER — Encounter (HOSPITAL_BASED_OUTPATIENT_CLINIC_OR_DEPARTMENT_OTHER): Payer: Self-pay | Admitting: Internal Medicine

## 2018-10-04 DIAGNOSIS — I272 Pulmonary hypertension, unspecified: Secondary | ICD-10-CM

## 2018-10-04 DIAGNOSIS — R931 Abnormal findings on diagnostic imaging of heart and coronary circulation: Secondary | ICD-10-CM

## 2018-10-04 DIAGNOSIS — M7989 Other specified soft tissue disorders: Secondary | ICD-10-CM

## 2018-10-04 NOTE — Telephone Encounter (Signed)
Pt called re: lesions/rash on legs. She said that water is coming out of the rash/lesion and she has been experiencing pain as well rated at 6/10 and would like to speak to care team about this. She also said that the mupirocin ointment does not seem to help.

## 2018-10-04 NOTE — Telephone Encounter (Signed)
Based on telemed visit, she needs in-person evaluation for this.  I encouraged her to go to a local urgent care but she prefers to see Elan on Friday.

## 2018-10-04 NOTE — Progress Notes (Signed)
Telemedicine Encounter ([facility]istant Site)    I conducted this encounter from [facility] via secure, live, face-to-face video conference with the patient. Holly Blair was located outside her apartment building without anyone else present.  Prior to the interview, the risks and benefits of telemedicine were discussed with the patient and verbal consent was obtained.   Specifically,    You have chosen to receive care through the use of telemedicine. Telemedicine enables health care providers at different locations to provide safe, effective, and convenient care through the use of technology. As with any health care service, there are risks associated with the use of telemedicine, including equipment failure, poor image resolution, and information security issues. We will ask before your visit if you understand the risks and benefits of telemedicine, have the opportunity to ask questions, and give your consent prior to your visit.    This visit is being conducted via Zoom in place of an in-person clinic visit due to the current SARS-CoV2 ([place]) epidemic, in order to reduce the risk of transmission of this highly contagious virus.    I confirmed the patient's name, date of birth, and callback number.    I introduced myself and showed my badge.    I explained that this visit will be billed the same as a face-to-face visit.      Chief Complaint   Patient presents with   . Rash       HISTORY OF PRESENT ILLNESS:  Bilateral leg swelling started about 1 month ago.  Getting worse and worse.  Had echo on Feb 27 showing EF 65%, mild-moderate AS, and mild pulmonary hypertension.    Became red a few days ago.  Then yesterday got blisters on the left leg only.  Draining clear sticky fluid.    No fever.  No cough.  Pain is severe.    PHYSICAL EXAM      General:   [x]  Alert []  Lethargic   [x]  Well-appearing []  Ill-appearing   [x]  In no acute distress []  In acute distress     Respiratory:  [x]  Speaking in full sentences comfortably []   Unable to speak in full sentences comfortably   [x]  Normal work of breathing []  Increased work of breathing   [x]  No cough during visit []  Coughing during visit     I can only get a fleeting view of her legs through her device but the left leg appears grossly swollen, beefy red, with large blisters.    ASSESSMENT AND PLAN:  Possible cellulitis vs stasis dermatitis  Concern for DVT, IVC clot, hepatic vein clot causing acute swelling  Likely needs vascular studies, possible compression dressings    I urged her to be seen in person at a local Urgent Care today but she prefers to wait till Friday when she can arrange transportation to [facility].    Follow up plan: Holly Blair April 17 at 12:20 PM    Future Appointments   Date Time Provider [place]   11/21/2018  9:15 AM Alinda Money, MD [place]

## 2018-10-04 NOTE — Telephone Encounter (Signed)
Has had rash on legs x 1 month. Rash is not improving. Left leg rash is weeping clear fluid since this morning.

## 2018-10-05 ENCOUNTER — Other Ambulatory Visit (HOSPITAL_BASED_OUTPATIENT_CLINIC_OR_DEPARTMENT_OTHER): Payer: Self-pay | Admitting: Naturopath

## 2018-10-05 NOTE — Addendum Note (Signed)
Addended by: Eldred Manges on: 10/05/2018 02:28 PM     Modules accepted: Orders

## 2018-10-05 NOTE — Telephone Encounter (Signed)
I just spoke with [name].  She is going to go to an Urgent [facility] in her neighborhood today.  She will notify us when she has an appointment and goes.

## 2018-10-05 NOTE — Telephone Encounter (Addendum)
Patient cancelled appointment.  Phone call to make sure she is being treated elsewhere. Message left for patient.

## 2018-10-05 NOTE — Telephone Encounter (Signed)
Patient canceled appt because she could not coordinate a ride. Requesting a call back from [facility] please.

## 2018-10-06 ENCOUNTER — Encounter (HOSPITAL_BASED_OUTPATIENT_CLINIC_OR_DEPARTMENT_OTHER): Payer: Medicare HMO | Admitting: Naturopath

## 2018-10-06 NOTE — Telephone Encounter (Signed)
Talked to [place] today.  She has an appointment at [facility] at 2:30pm

## 2018-10-13 ENCOUNTER — Inpatient Hospital Stay: Payer: Self-pay

## 2018-10-23 ENCOUNTER — Other Ambulatory Visit (HOSPITAL_BASED_OUTPATIENT_CLINIC_OR_DEPARTMENT_OTHER): Payer: Self-pay | Admitting: Naturopath

## 2018-10-23 DIAGNOSIS — R52 Pain, unspecified: Secondary | ICD-10-CM

## 2018-10-23 NOTE — Telephone Encounter (Signed)
The dose on the request is different than the dose on the med list.

## 2018-10-26 ENCOUNTER — Other Ambulatory Visit (HOSPITAL_BASED_OUTPATIENT_CLINIC_OR_DEPARTMENT_OTHER): Payer: Self-pay | Admitting: Naturopath

## 2018-10-26 DIAGNOSIS — F331 Major depressive disorder, recurrent, moderate: Secondary | ICD-10-CM

## 2018-10-26 MED ORDER — ACETAMINOPHEN 325 MG OR TABS
650.0000 mg | ORAL_TABLET | ORAL | 1 refills | Status: DC | PRN
Start: 2018-10-26 — End: 2022-07-07

## 2018-10-27 MED ORDER — BUPROPION HCL ER (SR) 150 MG OR TB12
150.0000 mg | EXTENDED_RELEASE_TABLET | Freq: Two times a day (BID) | ORAL | 1 refills | Status: DC
Start: 2018-10-27 — End: 2019-07-19

## 2018-11-03 ENCOUNTER — Telehealth (HOSPITAL_BASED_OUTPATIENT_CLINIC_OR_DEPARTMENT_OTHER): Payer: Self-pay | Admitting: Naturopath

## 2018-11-03 ENCOUNTER — Encounter (HOSPITAL_BASED_OUTPATIENT_CLINIC_OR_DEPARTMENT_OTHER): Payer: Self-pay

## 2018-11-03 NOTE — Telephone Encounter (Signed)
Message left on voice mail to return my call.    Await return call from [place]. Last seen by [name] 09/06/18. She is currently taking amlodipine 10 mg po daily.

## 2018-11-03 NOTE — Telephone Encounter (Signed)
Please try patient back and try to schedule a later apt into jenna or [facility] schedule

## 2018-11-03 NOTE — Telephone Encounter (Signed)
Message left on voice mail to return my call.

## 2018-11-03 NOTE — Telephone Encounter (Signed)
Pt cannot make 10.10am appt today. She said she has a different appt for that time so she cannot make it. Scheduled for 5/19 instead. RN wanted pt to have an appt today so I attempted to call her but she did not pick up. Left vm to call clinic back.

## 2018-11-03 NOTE — Telephone Encounter (Signed)
Holly Blair reports intermittent right sided mouth numbness x 1 week. Episodes last for 5/10 minutes. She is not having numbness now.  Denies vision, speech, cognitive changes, balance issues, weakness, facial drooping and headache.  Telemedicine today with [name].

## 2018-11-03 NOTE — Telephone Encounter (Signed)
Spoke with pt with pt who said she has a conflict with coming in today to see a provider in [facility]. Pt lives in [place] and has an appt today at [facility] there at [place] with one of her [place] providers (might be infectious disease?)    Discussed with pt that she should be checked out in her local area in [place] since she does not have transportation and that she will be at [place] today anyway.    Plan: Pt agrees to ask [place] re an urgent care clinic whiles she is there.   Alternatively, pt states that she knows of an urgent clinic in [facility] she can go to.    Pt still has tele med appt scheduled for Tues, 5/19 and pt will wait to cancel it to make sure she still doesn't need it. Otherwise she will cancel.   fyi to PCP

## 2018-11-03 NOTE — Telephone Encounter (Signed)
I think telemed is fine

## 2018-11-03 NOTE — Telephone Encounter (Signed)
Right side of mouth goes numb while taking Amlodipine 10mg  tab

## 2018-11-07 ENCOUNTER — Ambulatory Visit: Payer: Medicare HMO | Attending: Naturopath | Admitting: Naturopath

## 2018-11-07 ENCOUNTER — Encounter (HOSPITAL_BASED_OUTPATIENT_CLINIC_OR_DEPARTMENT_OTHER): Payer: Self-pay | Admitting: Naturopath

## 2018-11-07 DIAGNOSIS — Z972 Presence of dental prosthetic device (complete) (partial): Secondary | ICD-10-CM | POA: Insufficient documentation

## 2018-11-07 DIAGNOSIS — R200 Anesthesia of skin: Secondary | ICD-10-CM | POA: Insufficient documentation

## 2018-11-07 DIAGNOSIS — K0889 Other specified disorders of teeth and supporting structures: Secondary | ICD-10-CM | POA: Insufficient documentation

## 2018-11-07 NOTE — Progress Notes (Signed)
Is this visit being conducted using Telemedicine with live, interactive video and audio? Telemedicine (live, interactive video & audio)     Distant Site Telemedicine Encounter    I conducted this encounter from [facility] via secure, live, face-to-face video conference with the patient. Holly Blair was located at home. Prior to the interview, the risks and benefits of telemedicine were discussed with the patient and verbal consent was obtained.    This visit was conducted via Telehealth in lieu of an in-person encounter in my clinic due to the current COVID-19 outbreak and the risk that an in-person visit could pose to the patient, clinic staff, or the general public.    I confirmed the patient's name, date of birth, and callback number.  I introduced myself and showed my badge.  I explained that this visit will be billed the same as a face-to-face visit.    Telemedicine Consent   You have chosen to receive care through the use of telemedicine. Telemedicine enables health care providers at different locations to provide safe, effective, and convenient care through the use of technology. As with any health care service, there are risks associated with the use of telemedicine, including equipment failure, poor image resolution, and information security issues.     I cannot provide the same evaluation as in a face-to-face visit. I may need you to come in for further evaluation or care. If at any time your would like to be seen in-person, we will terminate the visit and connect you to the most feasible in-person care. The technology is encrypted and secure; however, no technology is 100% hack-proof. The technology is dependent on a reliable internet connection    --Do you understand the risks and benefits of telemedicine as I have explained them to you? Yes  --Have your questions regarding telemedicine been answered? Yes  --Do you consent to the use of telemedicine in your medical care today? Yes    I received consent from  the patient to have the visit by secure audio and video, including the statement:       SUBJECTIVE:   Holly Blair is a 53 year old female patient who presents with the following concern:     Chief Complaint:     She has developed numbness on the right side of her face. The numbness is intermittent and it started last week. The numbness is outside of her cheek and also on her teeth and gums but not in the inside of her cheek. The numbness is only toward the posterior part of the mouth and cheek (closer to ear).  The numbness episodes only last about 10 minutes and she can have several episodes in a day.  She denies other symptoms while having the episodes including weakness, pain, headaches.  She does not have any teeth in this area. She has dentures but does not wear them because of poor fitting. Her symptoms are not worsening since onset.   She denies weakness, slurred speech, mouth/facial pain, numbness in extremities, headaches and confusion.   She is getting Ceftriaxone infusions for treatment for abscess/cellulitis.  She has notified nurses of mouth numbness.  She has had some increased fatigue due to side effects of antibiotics but no other unusual symptoms.     Review of Systems   Constitution: Negative for fever and malaise/fatigue.   HENT: Negative for congestion and sore throat.    Respiratory: Negative for cough.    Skin: Negative for rash.   Musculoskeletal: Negative for muscle weakness.  Neurological: Positive for numbness. Negative for brief paralysis, disturbances in coordination, focal weakness, headaches, light-headedness, paresthesias and weakness.         Patient Active Problem List   Diagnosis   . Depression, major, recurrent ([place])   . Chronic knee pain   . Chronic back pain   . Senile nuclear cataract   . Myopia with astigmatism and presbyopia   . Mental developmental delay   . Morbid obesity with BMI of 50.0-59.9, adult ([place])   . Hyperlipidemia   . Systolic murmur   . Anxiety   . Rosacea   .  Essential hypertension   . Pulmonary HTN ([place])   . Prediabetes       Current Outpatient Medications   Medication Sig Dispense Refill   . acetaminophen 325 MG tablet Take 2 tablets (650 mg) by mouth every 4 hours as needed (pain). 60 tablet 1   . Acetaminophen 500 MG Oral Tab Take 1 tablet (500 mg) by mouth every 6 hours as needed for pain. (Patient taking differently: Take 500 mg by mouth every 6 hours as needed for pain. ) 60 tablet 0   . amLODIPine 10 MG tablet Take 1 tablet (10 mg) by mouth daily. 90 tablet 3   . atorvastatin 20 MG tablet Take 1 tablet (20 mg) by mouth daily. 90 tablet 3   . buPROPion SR 150 MG 12 hr tablet Take 1 tablet (150 mg) by mouth 2 times a day. 180 tablet 1   . clotrimazole 1 % cream Apply to affected area on abdomen 2 times a day. For fungal rash. 45 g 2   . FLUoxetine 20 MG capsule Take 3 capsules (60 mg) by mouth daily. 270 capsule 3   . Ibuprofen 600 MG Oral Tab take 1 tablet by mouth every 6 hours with food AND PLENTY OF WATER if needed for pain 90 tablet 0   . metFORMIN 500 MG tablet 500 mg daily at night for 5 days, increase to 500 mg twice daily, in two weeks increase to 500 mg in the morning and 1000 mg at night.  In one week increase to 1000 mg twice daily 120 tablet 4   . metroNIDAZOLE 0.75 % topical gel Apply to affected area on face 2 times a day. 1 Tube 3   . mupirocin 2 % ointment Apply to open lesions on lower legs and ankles three times daily 1 Tube 0   . NYSTATIN 100000 UNIT/GM cream Apply To the affected areas under breasts twice daily as directed 30 g 0   . oxybutynin 5 MG tablet Take 1 tablet (5 mg) by mouth 2 times a day. Take up to 3 times a day 90 tablet 12   . permethrin 5 % cream Apply from head to feet. Leave on for 8-14 hours, then wash off with soap and water. May reapply in 1 week if live mites appear. 60 g 0   . triamcinolone 0.1 % cream Apply to affected area on leg(s) 2 times a day. 1 Tube 3     No current facility-administered medications for this visit.         Review of patient's allergies indicates:  No Known Allergies    OBJECTIVE:  General: Alert, no acute distress  General appearance: healthy, alert, no distress  Mood/affect: Pleasant female. Mood and affect appropriate.  Symmetric smile and ability to puff out her cheeks symmetrically     ASSESSMENT/PLAN:    Marlin was seen today for follow-up .  Diagnoses and all orders for this visit:    Numbness around mouth and right facial numbness  Low likelihood of stoke because numbness is not associated with any weakness or paralysis. Follow-up one week. If numbness is still present I will refer for evaluation with ENT.   -     REFERRAL TO ORAL MED/DENTISTRY      Ill-fitting dentures  -     REFERRAL TO ORAL MED/DENTISTRY

## 2018-11-09 ENCOUNTER — Other Ambulatory Visit (HOSPITAL_BASED_OUTPATIENT_CLINIC_OR_DEPARTMENT_OTHER): Payer: Self-pay | Admitting: Hematology

## 2018-11-16 ENCOUNTER — Telehealth (HOSPITAL_BASED_OUTPATIENT_CLINIC_OR_DEPARTMENT_OTHER): Payer: Self-pay | Admitting: Naturopath

## 2018-11-16 ENCOUNTER — Ambulatory Visit (HOSPITAL_BASED_OUTPATIENT_CLINIC_OR_DEPARTMENT_OTHER): Payer: Medicare HMO | Admitting: Naturopath

## 2018-11-16 DIAGNOSIS — Z6841 Body Mass Index (BMI) 40.0 and over, adult: Secondary | ICD-10-CM

## 2018-11-16 DIAGNOSIS — D509 Iron deficiency anemia, unspecified: Secondary | ICD-10-CM | POA: Diagnosis present

## 2018-11-16 DIAGNOSIS — F329 Major depressive disorder, single episode, unspecified: Secondary | ICD-10-CM | POA: Diagnosis present

## 2018-11-16 DIAGNOSIS — E785 Hyperlipidemia, unspecified: Secondary | ICD-10-CM | POA: Diagnosis present

## 2018-11-16 DIAGNOSIS — G4733 Obstructive sleep apnea (adult) (pediatric): Secondary | ICD-10-CM | POA: Diagnosis present

## 2018-11-16 DIAGNOSIS — I350 Nonrheumatic aortic (valve) stenosis: Secondary | ICD-10-CM | POA: Diagnosis present

## 2018-11-16 DIAGNOSIS — R6250 Unspecified lack of expected normal physiological development in childhood: Secondary | ICD-10-CM | POA: Diagnosis present

## 2018-11-16 DIAGNOSIS — I272 Pulmonary hypertension, unspecified: Secondary | ICD-10-CM | POA: Diagnosis present

## 2018-11-16 DIAGNOSIS — I5033 Acute on chronic diastolic (congestive) heart failure: Secondary | ICD-10-CM | POA: Diagnosis present

## 2018-11-16 DIAGNOSIS — F419 Anxiety disorder, unspecified: Secondary | ICD-10-CM | POA: Diagnosis present

## 2018-11-16 DIAGNOSIS — Z7984 Long term (current) use of oral hypoglycemic drugs: Secondary | ICD-10-CM

## 2018-11-16 DIAGNOSIS — J9601 Acute respiratory failure with hypoxia: Secondary | ICD-10-CM | POA: Diagnosis present

## 2018-11-16 DIAGNOSIS — I878 Other specified disorders of veins: Secondary | ICD-10-CM | POA: Diagnosis present

## 2018-11-16 DIAGNOSIS — I110 Hypertensive heart disease with heart failure: Principal | ICD-10-CM | POA: Diagnosis present

## 2018-11-16 DIAGNOSIS — R0602 Shortness of breath: Secondary | ICD-10-CM

## 2018-11-16 NOTE — Progress Notes (Signed)
Is this visit being conducted using Telemedicine with live, interactive video and audio? Telemedicine (live, interactive video & audio)     Distant Site Telemedicine Encounter    I conducted this encounter from [facility] via secure, live, face-to-face video conference with the patient. Jodi was located at home. Prior to the interview, the risks and benefits of telemedicine were discussed with the patient and verbal consent was obtained.    Telemedicine Consent  This visit was conducted via Telehealth in lieu of an in-person encounter in my clinic due to the current COVID-19 outbreak and the risk that an in-person visit could pose to the patient, clinic staff, or the general public.  I confirmed the patient's name, date of birth, and callback number:YES  I introduced myself and showed my badge: YES  I explained that this visit will be billed the same as a face-to-face visit and patient consented to use telemedicine for their medical care today:YES  Patient reported all questions regarding telemedicine have been answered: YES    I received consent from the patient to have the visit by secure audio and video, including the statement: "I cannot provide the same evaluation as in a face-to-face visit. I may need you to come in for further evaluation or care. If at any time your would like to be seen in-person, we will terminate the visit and connect you to the most feasible in-person care. The technology is encrypted and secure; however, no technology is 100% hack-proof. The technology is dependent on a reliable internet connection."    SUBJECTIVE:   Sharunda is a 53 year old female patient who presents with the following concern:     Chief Complaint: SOB    She is experiencing SOB with walking around her house (into kitchen r bathroom).  It started acutely yesterday.  She has an inhaler that she used and she is not sure if it helped.  She denies cough, fever, chest tightness, chest pain, weakness, and inability to  smell/taste.  She is recovering from an abscess and surgical repair so her mobility has been significantly limited.     Review of Systems   Constitution: Negative for fever.   HENT: Negative for sore throat.    Respiratory: Positive for shortness of breath. Negative for cough, sputum production and wheezing.    Neurological: Negative for dizziness, light-headedness and weakness.         Patient Active Problem List   Diagnosis   . Depression, major, recurrent ([place])   . Chronic knee pain   . Chronic back pain   . Senile nuclear cataract   . Myopia with astigmatism and presbyopia   . Mental developmental delay   . Morbid obesity with BMI of 50.0-59.9, adult ([place])   . Hyperlipidemia   . Systolic murmur   . Anxiety   . Rosacea   . Essential hypertension   . Pulmonary HTN ([place])   . Prediabetes       Current Outpatient Medications   Medication Sig Dispense Refill   . acetaminophen 325 MG tablet Take 2 tablets (650 mg) by mouth every 4 hours as needed (pain). 60 tablet 1   . Acetaminophen 500 MG Oral Tab Take 1 tablet (500 mg) by mouth every 6 hours as needed for pain. (Patient taking differently: Take 500 mg by mouth every 6 hours as needed for pain. ) 60 tablet 0   . amLODIPine 10 MG tablet Take 1 tablet (10 mg) by mouth daily. 90 tablet 3   .  atorvastatin 20 MG tablet Take 1 tablet (20 mg) by mouth daily. 90 tablet 3   . buPROPion SR 150 MG 12 hr tablet Take 1 tablet (150 mg) by mouth 2 times a day. 180 tablet 1   . clotrimazole 1 % cream Apply to affected area on abdomen 2 times a day. For fungal rash. 45 g 2   . FLUoxetine 20 MG capsule Take 3 capsules (60 mg) by mouth daily. 270 capsule 3   . Ibuprofen 600 MG Oral Tab take 1 tablet by mouth every 6 hours with food AND PLENTY OF WATER if needed for pain 90 tablet 0   . metFORMIN 500 MG tablet 500 mg daily at night for 5 days, increase to 500 mg twice daily, in two weeks increase to 500 mg in the morning and 1000 mg at night.  In one week increase to 1000 mg twice daily  120 tablet 4   . metroNIDAZOLE 0.75 % topical gel Apply to affected area on face 2 times a day. 1 Tube 3   . mupirocin 2 % ointment Apply to open lesions on lower legs and ankles three times daily 1 Tube 0   . NYSTATIN 100000 UNIT/GM cream Apply To the affected areas under breasts twice daily as directed 30 g 0   . oxybutynin 5 MG tablet Take 1 tablet (5 mg) by mouth 2 times a day. Take up to 3 times a day 90 tablet 12   . permethrin 5 % cream Apply from head to feet. Leave on for 8-14 hours, then wash off with soap and water. May reapply in 1 week if live mites appear. 60 g 0   . triamcinolone 0.1 % cream Apply to affected area on leg(s) 2 times a day. 1 Tube 3     No current facility-administered medications for this visit.        Review of patient's allergies indicates:  No Known Allergies    OBJECTIVE:  General: Alert, no acute distress  General appearance: healthy, alert, no distress  Mood/affect: Pleasant female. Mood and affect appropriate.  Patient is able to talk in full sentences but sounds slightly out of breath     ASSESSMENT/PLAN:  Mouna was seen today for breathing problem.    Diagnoses and all orders for this visit:    SOB (shortness of breath)  Morbidly obese patient with acute SOB in light of recent activity is most concerning for PE. However she has had multiple clinic visits and public transportation rides in the last two weeks and Covid is also a concerned.  Recommended ED visit to have both of these things evaluated vs trying to manage her in outpatient setting.   Patient agrees to go to [facility] ED    Greater than 50% of this visit was spent face to face with patient in counseling,   coordination of care,  discussing treatment options, and  education regarding above problems. Total time spent was over 13 minutes.

## 2018-11-16 NOTE — Telephone Encounter (Signed)
RN Phone Triage for Possible COVID-19  Last Updated Nov 06, 2018    Chief Complaint:  Evaluation for possible COVID-19     HPI:   Holly Blair is a 53 year old female who is being evaluated for possible COVID-19 symptoms or concerns.     Has the patient had NEW symptoms of:    Yes No   []    [x]   Cough    []    [x]   Fever (T > 38C or 100.37F)    [x]    []   Shortness of breath  When walking short distances   []    [x]   Myalgias    []    [x]   Chills   []    [x]   Rhinorrhea   []    [x]   Sore throat    []    [x]   Anosmia   []    [x]   Ageusia   []    [x]   Headache   []    [x]   GI symptoms (e.g. nausea, vomiting, diarrhea)    When did symptoms start?   yesterday    Other symptoms/additional HPI?   no    Risk Evaluation Questions  In the 14 days prior to symptom onset:  - Had close contact with anyone diagnosed with COVID-19?  - Been a resident, a visitor, or worked in a skilled nursing facility?    Close contact means within approximately 6 feet of a COVID-19 case for a prolonged period of time. Close contact can occur while caring for, living with, visiting, or sharing a healthcare room or waiting area with a COVID-19 case or while having direct contact with infectious secretions of a COVID-19 case [e.g., being coughed on].   No   Older adult (age ? 58 years)?   NO   Immunocompromising condition (e.g. current cancer, solid organ or bone marrow transplant, diabetes, hemodialysis, HIV, cirrhosis, rheumatoid arthritis, lupus)?    Use of an immunosuppressive medication (e.g. prednisone, rituximab, chemotherapy)?    If yes, refer to Immunocompromised Patients Protocol.   No   Chronic lung disease (e.g. COPD, asthma)?   No     Pregnant woman?    If yes, refer to Pregnancy Protocol.   No   Person living homeless or in congregate facilities (such as dorms, fraternities, sororities, shelters, jail, prison, skilled nursing facilities, adult family homes)?   No   Health care worker or in a public safety occupation (e.g. Event organiser,  firefighter, EMS)?    For symptomatic [employer] employees, please review the FAQ for Employees with Respiratory Infection Symptoms.    No   Working in a Environmental manager occupation?    e.g. grocery store, [employer], gas station, public utilities or transportation occupations   No   Reference: [place] COVID-19 Response site          Auditory Physical Exam:  General:   [x]  Sounds alert []  Sounds lethargic   [x]  In no acute distress []  In acute distress     Respiratory:  [x]  Speaking in full sentences comfortably []  Unable to speak in full sentences comfortably   []  No cough during conversation []  Coughing during conversation       Assessment:  Does the patient meet criteria for COVID-19 testing? Routing to provider to determine need for testing     Telemedicine visit today.  Transferred to PSS to schedule telemedicine.  Holly Blair will call [phone] if sx increase.

## 2018-11-16 NOTE — Telephone Encounter (Signed)
Pt called re: SOB x 2 days when walking to the bathroom or somewhere not very far. She said she doesn't have fever and other symptoms of Covid-19.

## 2018-11-16 NOTE — Telephone Encounter (Signed)
Telemedicine visit has been scheduled for Thursday, 11/16/2018 at 11.00 AM

## 2018-11-19 ENCOUNTER — Inpatient Hospital Stay
Admission: EM | Admit: 2018-11-19 | Discharge: 2018-11-21 | DRG: 291 | Disposition: A | Discharge status: Home / Self Care | Payer: Medicare HMO | Attending: Internal Medicine | Admitting: Internal Medicine

## 2018-11-19 ENCOUNTER — Inpatient Hospital Stay (HOSPITAL_COMMUNITY): Payer: Self-pay | Admitting: Internal Medicine

## 2018-11-19 ENCOUNTER — Other Ambulatory Visit: Payer: Self-pay | Admitting: Emergency Medicine

## 2018-11-19 DIAGNOSIS — R0600 Dyspnea, unspecified: Secondary | ICD-10-CM

## 2018-11-19 DIAGNOSIS — R0789 Other chest pain: Secondary | ICD-10-CM

## 2018-11-19 DIAGNOSIS — J9601 Acute respiratory failure with hypoxia: Secondary | ICD-10-CM

## 2018-11-19 DIAGNOSIS — D649 Anemia, unspecified: Secondary | ICD-10-CM

## 2018-11-19 DIAGNOSIS — R918 Other nonspecific abnormal finding of lung field: Secondary | ICD-10-CM

## 2018-11-19 DIAGNOSIS — R079 Chest pain, unspecified: Secondary | ICD-10-CM

## 2018-11-19 DIAGNOSIS — R0601 Orthopnea: Secondary | ICD-10-CM

## 2018-11-19 DIAGNOSIS — R600 Localized edema: Secondary | ICD-10-CM

## 2018-11-19 DIAGNOSIS — Z20828 Contact with and (suspected) exposure to other viral communicable diseases: Secondary | ICD-10-CM

## 2018-11-19 DIAGNOSIS — R0602 Shortness of breath: Secondary | ICD-10-CM

## 2018-11-19 LAB — LAB ADD ON ORDER

## 2018-11-19 LAB — CBC, DIFF
% Basophils: 0 %
% Eosinophils: 3 %
% Immature Granulocytes: 1 %
% Lymphocytes: 22 %
% Monocytes: 6 %
% Neutrophils: 68 %
% Nucleated RBC: 0 %
Absolute Eosinophil Count: 0.29 10*3/uL (ref 0.00–0.50)
Absolute Lymphocyte Count: 2.33 10*3/uL (ref 1.00–4.80)
Basophils: 0.04 10*3/uL (ref 0.00–0.20)
Hematocrit: 28 % — ABNORMAL LOW (ref 36–45)
Hemoglobin: 8.1 g/dL — ABNORMAL LOW (ref 11.5–15.5)
Immature Granulocytes: 0.06 10*3/uL — ABNORMAL HIGH (ref 0.00–0.05)
MCH: 23.7 pg — ABNORMAL LOW (ref 27.3–33.6)
MCHC: 28.6 g/dL — ABNORMAL LOW (ref 32.2–36.5)
MCV: 83 fL (ref 81–98)
Monocytes: 0.64 10*3/uL (ref 0.00–0.80)
Neutrophils: 7.47 10*3/uL — ABNORMAL HIGH (ref 1.80–7.00)
Nucleated RBC: 0 10*3/uL
Platelet Count: 400 10*3/uL (ref 150–400)
RBC: 3.42 10*6/uL — ABNORMAL LOW (ref 3.80–5.00)
RDW-CV: 20.2 % — ABNORMAL HIGH (ref 11.6–14.4)
WBC: 10.83 10*3/uL — ABNORMAL HIGH (ref 4.3–10.0)

## 2018-11-19 LAB — COMPREHENSIVE METABOLIC PANEL
ALT (GPT): 11 U/L (ref 7–33)
AST (GOT): 14 U/L (ref 9–38)
Albumin: 3.4 g/dL — ABNORMAL LOW (ref 3.5–5.2)
Alkaline Phosphatase (Total): 102 U/L (ref 34–121)
Anion Gap: 2 — ABNORMAL LOW (ref 4–12)
Bilirubin (Total): 0.6 mg/dL (ref 0.2–1.3)
Calcium: 9.4 mg/dL (ref 8.9–10.2)
Carbon Dioxide, Total: 28 meq/L (ref 22–32)
Chloride: 106 meq/L (ref 98–108)
Creatinine: 0.8 mg/dL (ref 0.38–1.02)
GFR, Calc, African American: >60 mL/min/{1.73_m2} (ref >59)
GFR, Calc, European American: >60 mL/min/{1.73_m2} (ref >59)
Glucose: 98 mg/dL (ref 62–125)
Potassium: 3.8 meq/L (ref 3.6–5.2)
Protein (Total): 8 g/dL (ref 6.0–8.2)
Sodium: 136 meq/L (ref 135–145)
Urea Nitrogen: 10 mg/dL (ref 8–21)

## 2018-11-19 LAB — SARS-COV-2 (COVID-19) QUALITATIVE RAPID PCR: COVID-19 Coronavirus Qual PCR Result: NOT DETECTED

## 2018-11-19 LAB — BLOOD GAS, VENOUS, W/ HGB
Base Excess, Blood, VEN: 2.8 meq/L (ref 0.0–3.0)
Bicarbonate, VEN: 29 meq/L — ABNORMAL HIGH (ref 23–27)
Hemoglobin: 8.6 g/dL — ABNORMAL LOW (ref 11.5–15.5)
O2 Saturation, VEN: 27 % — ABNORMAL LOW (ref 70–75)
pCO2, VEN: 53 mmHg — ABNORMAL HIGH (ref 42–50)
pH, VEN: 7.35 (ref 7.32–7.40)
pO2, VEN: 21 mmHg — ABNORMAL LOW (ref 35–40)

## 2018-11-19 LAB — B_TYPE NATRIURETIC PEPTIDE: B_Type Natriuretic Peptide: 110 pg/mL — ABNORMAL HIGH (ref <101)

## 2018-11-19 LAB — TROPONIN_I
Troponin_I Interpretation: NORMAL
Troponin_I: <0.03 ng/mL (ref <0.04)

## 2018-11-19 LAB — PROCALCITONIN (NWH): Procalcitonin: <0.05 ng/mL (ref 0–0.49)

## 2018-11-19 LAB — C_REACTIVE PROTEIN: C_Reactive Protein: 31.9 mg/L — ABNORMAL HIGH (ref 0.0–10.0)

## 2018-11-19 LAB — D-DIMER,QUANT: D_Dimer, Quant: 3.02 ug{FEU}/mL — ABNORMAL HIGH (ref 0.00–0.59)

## 2018-11-19 LAB — LIPASE: Lipase: 9 U/L (ref <70)

## 2018-11-19 LAB — L LACTATE, VENOUS WB (TO U/H LAB WITHIN 30 MIN): L Lactate (Direct), Venous Whole Blood: 1 mmol/L (ref 0.6–1.9)

## 2018-11-20 ENCOUNTER — Other Ambulatory Visit (HOSPITAL_COMMUNITY): Payer: Self-pay

## 2018-11-20 ENCOUNTER — Other Ambulatory Visit (HOSPITAL_COMMUNITY): Payer: Medicare HMO

## 2018-11-20 ENCOUNTER — Encounter (HOSPITAL_COMMUNITY): Payer: Self-pay

## 2018-11-20 DIAGNOSIS — R0902 Hypoxemia: Secondary | ICD-10-CM

## 2018-11-20 DIAGNOSIS — R0602 Shortness of breath: Secondary | ICD-10-CM

## 2018-11-20 DIAGNOSIS — I890 Lymphedema, not elsewhere classified: Secondary | ICD-10-CM

## 2018-11-20 DIAGNOSIS — Z6841 Body Mass Index (BMI) 40.0 and over, adult: Secondary | ICD-10-CM

## 2018-11-20 DIAGNOSIS — I272 Pulmonary hypertension, unspecified: Secondary | ICD-10-CM

## 2018-11-20 DIAGNOSIS — D649 Anemia, unspecified: Secondary | ICD-10-CM

## 2018-11-20 DIAGNOSIS — I87332 Chronic venous hypertension (idiopathic) with ulcer and inflammation of left lower extremity: Secondary | ICD-10-CM

## 2018-11-20 DIAGNOSIS — R0600 Dyspnea, unspecified: Secondary | ICD-10-CM

## 2018-11-20 LAB — CBC, DIFF
% Basophils: 1 %
% Eosinophils: 2 %
% Immature Granulocytes: 1 %
% Lymphocytes: 25 %
% Monocytes: 6 %
% Neutrophils: 65 %
% Nucleated RBC: 0 %
Absolute Eosinophil Count: 0.22 10*3/uL (ref 0.00–0.50)
Absolute Lymphocyte Count: 2.57 10*3/uL (ref 1.00–4.80)
Basophils: 0.06 10*3/uL (ref 0.00–0.20)
Hematocrit: 28 % — ABNORMAL LOW (ref 36–45)
Hemoglobin: 8.3 g/dL — ABNORMAL LOW (ref 11.5–15.5)
Immature Granulocytes: 0.06 10*3/uL — ABNORMAL HIGH (ref 0.00–0.05)
MCH: 24.3 pg — ABNORMAL LOW (ref 27.3–33.6)
MCHC: 29.7 g/dL — ABNORMAL LOW (ref 32.2–36.5)
MCV: 82 fL (ref 81–98)
Monocytes: 0.62 10*3/uL (ref 0.00–0.80)
Neutrophils: 6.97 10*3/uL (ref 1.80–7.00)
Nucleated RBC: 0 10*3/uL
Platelet Count: 372 10*3/uL (ref 150–400)
RBC: 3.41 10*6/uL — ABNORMAL LOW (ref 3.80–5.00)
RDW-CV: 20.1 % — ABNORMAL HIGH (ref 11.6–14.4)
WBC: 10.5 10*3/uL — ABNORMAL HIGH (ref 4.3–10.0)

## 2018-11-20 LAB — BASIC METABOLIC PANEL
Anion Gap: 9 (ref 4–12)
Calcium: 8.7 mg/dL — ABNORMAL LOW (ref 8.9–10.2)
Carbon Dioxide, Total: 26 meq/L (ref 22–32)
Chloride: 103 meq/L (ref 98–108)
Creatinine: 0.81 mg/dL (ref 0.38–1.02)
GFR, Calc, African American: >60 mL/min/{1.73_m2} (ref >59)
GFR, Calc, European American: >60 mL/min/{1.73_m2} (ref >59)
Glucose: 126 mg/dL — ABNORMAL HIGH (ref 62–125)
Potassium: 3.8 meq/L (ref 3.6–5.2)
Sodium: 138 meq/L (ref 135–145)
Urea Nitrogen: 10 mg/dL (ref 8–21)

## 2018-11-20 LAB — EKG 12 LEAD
Atrial Rate: 76 {beats}/min
P Axis: 66 degrees
P-R Interval: 158 ms
Q-T Interval: 386 ms
QRS Duration: 74 ms
QTC Calculation: 434 ms
R Axis: -3 degrees
T Axis: 59 degrees
Ventricular Rate: 76 {beats}/min

## 2018-11-20 LAB — IRON BINDING CAPACITY (W/IRON, TRANSFERRIN & TRANSF SAT)
Iron, SRM: 24 ug/dL — ABNORMAL LOW (ref 31–171)
Total Iron Binding Capacity: 305 ug/dL (ref 270–535)
Transferrin Saturation: 8 % — ABNORMAL LOW (ref 10–45)
Transferrin: 218 mg/dL (ref 192–382)

## 2018-11-20 LAB — FERRITIN: Ferritin: 47 ng/mL (ref 10–180)

## 2018-11-21 ENCOUNTER — Telehealth (HOSPITAL_BASED_OUTPATIENT_CLINIC_OR_DEPARTMENT_OTHER): Payer: Self-pay | Admitting: Naturopath

## 2018-11-21 ENCOUNTER — Telehealth (HOSPITAL_BASED_OUTPATIENT_CLINIC_OR_DEPARTMENT_OTHER): Payer: Self-pay | Admitting: Sleep Medicine

## 2018-11-21 ENCOUNTER — Ambulatory Visit: Payer: Medicare HMO | Admitting: Hematology

## 2018-11-21 ENCOUNTER — Encounter (HOSPITAL_BASED_OUTPATIENT_CLINIC_OR_DEPARTMENT_OTHER): Payer: Self-pay | Admitting: Hematology

## 2018-11-21 DIAGNOSIS — D509 Iron deficiency anemia, unspecified: Secondary | ICD-10-CM

## 2018-11-21 DIAGNOSIS — I5031 Acute diastolic (congestive) heart failure: Secondary | ICD-10-CM

## 2018-11-21 DIAGNOSIS — L97921 Non-pressure chronic ulcer of unspecified part of left lower leg limited to breakdown of skin: Secondary | ICD-10-CM

## 2018-11-21 DIAGNOSIS — I272 Pulmonary hypertension, unspecified: Secondary | ICD-10-CM

## 2018-11-21 LAB — CBC (HEMOGRAM)
Hematocrit: 26 % — ABNORMAL LOW (ref 36–45)
Hemoglobin: 7.7 g/dL — ABNORMAL LOW (ref 11.5–15.5)
MCH: 24 pg — ABNORMAL LOW (ref 27.3–33.6)
MCHC: 29.6 g/dL — ABNORMAL LOW (ref 32.2–36.5)
MCV: 81 fL (ref 81–98)
Platelet Count: 325 10*3/uL (ref 150–400)
RBC: 3.21 10*6/uL — ABNORMAL LOW (ref 3.80–5.00)
RDW-CV: 19.9 % — ABNORMAL HIGH (ref 11.6–14.4)
WBC: 9.56 10*3/uL (ref 4.3–10.0)

## 2018-11-21 LAB — BASIC METABOLIC PANEL
Anion Gap: 11 (ref 4–12)
Calcium: 8.6 mg/dL — ABNORMAL LOW (ref 8.9–10.2)
Carbon Dioxide, Total: 27 meq/L (ref 22–32)
Chloride: 102 meq/L (ref 98–108)
Creatinine: 0.85 mg/dL (ref 0.38–1.02)
Glucose: 112 mg/dL (ref 62–125)
Potassium: 3.5 meq/L — ABNORMAL LOW (ref 3.6–5.2)
Sodium: 140 meq/L (ref 135–145)
Urea Nitrogen: 11 mg/dL (ref 8–21)
eGFR by CKD-EPI: >60 mL/min/{1.73_m2} (ref >59)

## 2018-11-21 NOTE — Telephone Encounter (Signed)
[facility] leg.

## 2018-11-21 NOTE — Telephone Encounter (Signed)
RETURN CALL: Voicemail - Detailed Message      SUBJECT:  General Message     MESSAGE: Pt was discharged from the hospital and wants to set a sleep study appointment with Dr. Nicolasa Ducking.

## 2018-11-22 ENCOUNTER — Encounter (HOSPITAL_BASED_OUTPATIENT_CLINIC_OR_DEPARTMENT_OTHER): Payer: Self-pay

## 2018-11-22 NOTE — Telephone Encounter (Signed)
Clinic called patient and scheduled for her TELEMEDICINE with Dr Nicolasa Ducking on 6/9 at 1:30pm- [place]

## 2018-11-24 ENCOUNTER — Ambulatory Visit: Payer: Medicare HMO | Attending: Naturopath | Admitting: Naturopath

## 2018-11-24 ENCOUNTER — Encounter (HOSPITAL_BASED_OUTPATIENT_CLINIC_OR_DEPARTMENT_OTHER): Payer: Self-pay | Admitting: Naturopath

## 2018-11-24 ENCOUNTER — Encounter (HOSPITAL_BASED_OUTPATIENT_CLINIC_OR_DEPARTMENT_OTHER): Payer: Self-pay

## 2018-11-24 DIAGNOSIS — D509 Iron deficiency anemia, unspecified: Secondary | ICD-10-CM | POA: Insufficient documentation

## 2018-11-24 DIAGNOSIS — Z1159 Encounter for screening for other viral diseases: Secondary | ICD-10-CM | POA: Insufficient documentation

## 2018-11-24 DIAGNOSIS — Z1211 Encounter for screening for malignant neoplasm of colon: Secondary | ICD-10-CM | POA: Insufficient documentation

## 2018-11-24 DIAGNOSIS — Z5181 Encounter for therapeutic drug level monitoring: Secondary | ICD-10-CM | POA: Insufficient documentation

## 2018-11-24 LAB — BLOOD C/S
Culture: NO GROWTH
Culture: NO GROWTH

## 2018-11-24 NOTE — Progress Notes (Signed)
Is this visit being conducted using Telemedicine with live, interactive video and audio? Telemedicine (live, interactive video & audio)     Distant Site Telemedicine Encounter    I conducted this encounter from [facility] via secure, live, face-to-face video conference with the patient. Holly Blair was located at home. Prior to the interview, the risks and benefits of telemedicine were discussed with the patient and verbal consent was obtained.    Telemedicine Consent   This visit was conducted via Telehealth in lieu of an in-person encounter in my clinic due to the current COVID-19 outbreak and the risk that an in-person visit could pose to the patient, clinic staff, or the general public.  I confirmed the patient's name, date of birth, and callback number:YES  I introduced myself and showed my badge: YES  I explained that this visit will be billed the same as a face-to-face visit and patient consented to use telemedicine for their medical care today:YES  Patient reported all questions regarding telemedicine have been answered: YES    I received consent from the patient to have the visit by secure audio and video, including the statement: "I cannot provide the same evaluation as in a face-to-face visit. I may need you to come in for further evaluation or care. If at any time your would like to be seen in-person, we will terminate the visit and connect you to the most feasible in-person care. The technology is encrypted and secure; however, no technology is 100% hack-proof. The technology is dependent on a reliable internet connection."    SUBJECTIVE:   Holly Blair is a 53 year old female patient who presents with the following concern:     Chief Complaint: ED follow-up     She was seen in ED last week at [facility] for evaluation for SOB.  Notes are not available.  According to patient she was diagnosed with pneumonia and pleurisy.  She is now taking furosemide 20 mg daily and got IV antibiotics in the hospital.  She also was  prescribed ferrous sulphate for iron deficiency anemia and she was set up for an evaluation for sleep apnea.  She was able to speak with Dr. Marlowe Sax from [facility] and was recommended to continue the iron supplementation for 6-8 weeks and to have a Colonoscopy/Endoscopy.  She had a negative Covid test and also had a CT and an echocardiogram.   She is very concerned about potassium levels with taking Furosemide.   She denies SOB, dizziness, headaches and weakness.     Review of Systems   Constitution: Negative for chills and fever.   Cardiovascular: Negative for chest pain.   Respiratory: Negative for cough and shortness of breath.    Neurological: Negative for dizziness, headaches, loss of balance and sensory change.         Patient Active Problem List   Diagnosis   . Depression, major, recurrent ([place])   . Chronic knee pain   . Chronic back pain   . Senile nuclear cataract   . Myopia with astigmatism and presbyopia   . Mental developmental delay   . Morbid obesity with BMI of 50.0-59.9, adult ([place])   . Hyperlipidemia   . Systolic murmur   . Anxiety   . Rosacea   . Essential hypertension   . Pulmonary HTN ([place])   . Prediabetes       Current Outpatient Medications   Medication Sig Dispense Refill   . acetaminophen 325 MG tablet Take 2 tablets (650 mg) by mouth  every 4 hours as needed (pain). 60 tablet 1   . Acetaminophen 500 MG Oral Tab Take 1 tablet (500 mg) by mouth every 6 hours as needed for pain. (Patient taking differently: Take 500 mg by mouth every 6 hours as needed for pain. ) 60 tablet 0   . amLODIPine 10 MG tablet Take 1 tablet (10 mg) by mouth daily. 90 tablet 3   . atorvastatin 20 MG tablet Take 1 tablet (20 mg) by mouth daily. 90 tablet 3   . buPROPion SR 150 MG 12 hr tablet Take 1 tablet (150 mg) by mouth 2 times a day. 180 tablet 1   . clotrimazole 1 % cream Apply to affected area on abdomen 2 times a day. For fungal rash. 45 g 2   . FLUoxetine 20 MG capsule Take 3 capsules (60 mg) by mouth daily. 270  capsule 3   . Ibuprofen 600 MG Oral Tab take 1 tablet by mouth every 6 hours with food AND PLENTY OF WATER if needed for pain 90 tablet 0   . metFORMIN 500 MG tablet 500 mg daily at night for 5 days, increase to 500 mg twice daily, in two weeks increase to 500 mg in the morning and 1000 mg at night.  In one week increase to 1000 mg twice daily 120 tablet 4   . metroNIDAZOLE 0.75 % topical gel Apply to affected area on face 2 times a day. 1 Tube 3   . mupirocin 2 % ointment Apply to open lesions on lower legs and ankles three times daily 1 Tube 0   . NYSTATIN 100000 UNIT/GM cream Apply To the affected areas under breasts twice daily as directed 30 g 0   . oxybutynin 5 MG tablet Take 1 tablet (5 mg) by mouth 2 times a day. Take up to 3 times a day 90 tablet 12   . permethrin 5 % cream Apply from head to feet. Leave on for 8-14 hours, then wash off with soap and water. May reapply in 1 week if live mites appear. 60 g 0   . triamcinolone 0.1 % cream Apply to affected area on leg(s) 2 times a day. 1 Tube 3     No current facility-administered medications for this visit.        Review of patient's allergies indicates:  No Known Allergies    OBJECTIVE:  General: Alert, no acute distress  General appearance: healthy, alert, no distress  Mood/affect: Pleasant female. Mood and affect appropriate.  Speaking in full sentences without difficulty.  No cough during visit.    ASSESSMENT/PLAN:    Holly Blair was seen today for er follow up.    Diagnoses and all orders for this visit:    Medication monitoring encounter  -     Electrolytes    Colon cancer screening  -     REFERRAL TO GASTROENTEROLOGY-PROCEDURE    Iron deficiency anemia, unspecified iron deficiency anemia type  -     REFERRAL TO GASTROENTEROLOGY-PROCEDURE    Encounter for hepatitis C screening test for low risk patient  -     Hepatitis C Ab w/o PCR    Follow-up two weeks

## 2018-11-27 ENCOUNTER — Ambulatory Visit: Payer: Medicare HMO | Attending: Naturopath

## 2018-11-27 ENCOUNTER — Ambulatory Visit: Payer: Medicare HMO | Attending: Internal Medicine | Admitting: Internal Medicine

## 2018-11-27 VITALS — BP 120/80 | HR 64 | Temp 98.5°F | Resp 20 | Ht 64.0 in | Wt 341.8 lb

## 2018-11-27 DIAGNOSIS — Z5181 Encounter for therapeutic drug level monitoring: Secondary | ICD-10-CM | POA: Insufficient documentation

## 2018-11-27 DIAGNOSIS — Z1159 Encounter for screening for other viral diseases: Secondary | ICD-10-CM | POA: Insufficient documentation

## 2018-11-27 DIAGNOSIS — R238 Other skin changes: Secondary | ICD-10-CM | POA: Insufficient documentation

## 2018-11-27 DIAGNOSIS — I87312 Chronic venous hypertension (idiopathic) with ulcer of left lower extremity: Secondary | ICD-10-CM | POA: Insufficient documentation

## 2018-11-27 DIAGNOSIS — L97921 Non-pressure chronic ulcer of unspecified part of left lower leg limited to breakdown of skin: Secondary | ICD-10-CM | POA: Insufficient documentation

## 2018-11-27 DIAGNOSIS — I890 Lymphedema, not elsewhere classified: Secondary | ICD-10-CM | POA: Insufficient documentation

## 2018-11-27 DIAGNOSIS — L97929 Non-pressure chronic ulcer of unspecified part of left lower leg with unspecified severity: Secondary | ICD-10-CM | POA: Insufficient documentation

## 2018-11-27 LAB — ELECTROLYTES
Anion Gap: 8 (ref 4–12)
Carbon Dioxide, Total: 29 meq/L (ref 22–32)
Chloride: 101 meq/L (ref 98–108)
Potassium: 3.8 meq/L (ref 3.6–5.2)
Sodium: 138 meq/L (ref 135–145)

## 2018-11-27 NOTE — Progress Notes (Signed)
Pt has a nickel size superficial lesion on the left pretib.

## 2018-11-27 NOTE — Progress Notes (Signed)
Wound Care Instructions     Left Lower Leg     Wash   Warm water pat dry     Dressing   Compression Instructions:  Apply multilayer compression dressing to Left Lower leg     Cleanse wound with normal saline or water and cleanser,   Apply barrier cream to top of calf to secure prn,   Primary dressing:mepilex transfer   Secondary dressing: conform gauze   Apply Multilayer compression wrap: coban 2 layer   Stockinette or nylon to cover wrap,   Change dressing Weekly.   Elevate legs 4-6 times daily.   Keep dressing dry, and contact wound care center or home health agency if dressing becomes wet or slides down.   Never use scissors to remove dressing, always unravel dressing carefully if removal necessary.     KEEP THE WRAP DRY- IF YOU SHOWER PURCHASE A SHOWER BOOT TO COVER THE WRAP     Change  Come to the wound center for a nurse visit   Change wrap       MEASURE FOR [employer] WRAP

## 2018-11-27 NOTE — Patient Instructions (Addendum)
Wound Care Instructions     Left Lower Leg     Wash   Warm water pat dry     Dressing   Compression Instructions:  Apply multilayer compression dressing to Left Lower leg     Cleanse wound with normal saline or water and cleanser,   Apply barrier cream to top of calf to secure prn,   Primary dressing: mepilex transfer   Secondary dressing: conform gauze   Apply Multilayer compression wrap: coban 2 layer   Stockinette or nylon to cover wrap,   Change dressing Weekly.   Elevate legs 4-6 times daily.   Keep dressing dry, and contact wound care center or home health agency if dressing becomes wet or slides down.   Never use scissors to remove dressing, always unravel dressing carefully if removal necessary.     KEEP THE WRAP DRY- IF YOU SHOWER PURCHASE A SHOWER BOOT TO COVER THE WRAP     Change  Come to the wound center for a nurse visit   Change wrap       MEASURE FOR [employer] WRAP

## 2018-11-27 NOTE — Progress Notes (Signed)
Chief Complaints:  Nonhealing left leg ulcer associated with a abscess status post I&D.    History of present Illness:  53 years old lady was referred to [facility] wound care clinic for evaluation of nonhealing left leg ulcer associated with a surgical procedure after patient underwent I&D.  She was diagnosed with an abscess in the early part of April 2020.  As per patient all this started at the end of March 2020 when she developed redness and swelling of the left leg which  was progressively getting better.  She was later hospitalized and received antibiotic during her hospitalization.  She reported interval improvement of her left leg.  She came to the wound care clinic with a Coban compression to the left lower extremity.    Past Medical History:   Diagnosis Date   . Acne    . Anxiety state, unspecified    . Arthritis    . Cataract    . Chronic back pain    . Chronic knee pain    . Depression, major, recurrent ([place])    . Heart murmur    . Hernia    . Obesity      Past Surgical History:   Procedure Laterality Date   . LIG/TRNSXJ FLP TUBE ABDL/VAG APPR UNI/BI       Review of patient's allergies indicates:  No Known Allergies  Current Outpatient Medications   Medication Sig Dispense Refill   . acetaminophen 325 MG tablet Take 2 tablets (650 mg) by mouth every 4 hours as needed (pain). 60 tablet 1   . Acetaminophen 500 MG Oral Tab Take 1 tablet (500 mg) by mouth every 6 hours as needed for pain. (Patient taking differently: Take 500 mg by mouth every 6 hours as needed for pain. ) 60 tablet 0   . amLODIPine 10 MG tablet Take 1 tablet (10 mg) by mouth daily. 90 tablet 3   . atorvastatin 20 MG tablet Take 1 tablet (20 mg) by mouth daily. 90 tablet 3   . buPROPion SR 150 MG 12 hr tablet Take 1 tablet (150 mg) by mouth 2 times a day. 180 tablet 1   . clotrimazole 1 % cream Apply to affected area on abdomen 2 times a day. For fungal rash. 45 g 2   . FLUoxetine 20 MG capsule Take 3 capsules (60 mg) by mouth  daily. 270 capsule 3   . furosemide 20 MG tablet Take 20 mg by mouth daily.     . Ibuprofen 600 MG Oral Tab take 1 tablet by mouth every 6 hours with food AND PLENTY OF WATER if needed for pain 90 tablet 0   . IRON (FERROUS SULFATE) OR Take by mouth.     . metFORMIN 500 MG tablet 500 mg daily at night for 5 days, increase to 500 mg twice daily, in two weeks increase to 500 mg in the morning and 1000 mg at night.  In one week increase to 1000 mg twice daily 120 tablet 4   . metroNIDAZOLE 0.75 % topical gel Apply to affected area on face 2 times a day. 1 Tube 3   . mupirocin 2 % ointment Apply to open lesions on lower legs and ankles three times daily 1 Tube 0   . NYSTATIN 100000 UNIT/GM cream Apply To the affected areas under breasts twice daily as directed 30 g 0   . oxybutynin 5 MG tablet Take 1 tablet (5 mg) by mouth 2 times a day. Take  up to 3 times a day 90 tablet 12   . permethrin 5 % cream Apply from head to feet. Leave on for 8-14 hours, then wash off with soap and water. May reapply in 1 week if live mites appear. 60 g 0   . triamcinolone 0.1 % cream Apply to affected area on leg(s) 2 times a day. 1 Tube 3     No current facility-administered medications for this visit.      Family History     Problem (# of Occurrences) Relation (Name,Age of Onset)    Cancer (1) Mother: lung cancer, long history of smkoing    Colorectal Cancer (1) Maternal Grandmother    Hypertension (1) Maternal Grandfather       Negative family history of: Uterine Cancer, Prolapse, Incontinence        Social History     Socioeconomic History   . Marital status: Significant other     Spouse name: Not on file   . Number of children: Not on file   . Years of education: Not on file   . Highest education level: Not on file   Occupational History   . Occupation: Fish farm manager: DISABLED     Comment: [employer], IT trainer   Social Needs   . Financial resource strain: Not on file   . Food insecurity     Worry: Not on file     Inability: Not on  file   . Transportation needs     Medical: Not on file     Non-medical: Not on file   Tobacco Use   . Smoking status: Never Smoker   . Smokeless tobacco: Never Used   Substance and Sexual Activity   . Alcohol use: No   . Drug use: No   . Sexual activity: Never   Lifestyle   . Physical activity     Days per week: Not on file     Minutes per session: Not on file   . Stress: Not on file   Relationships   . Social Product manager on phone: Not on file     Gets together: Not on file     Attends religious service: Not on file     Active member of club or organization: Not on file     Attends meetings of clubs or organizations: Not on file     Relationship status: Not on file   . Intimate partner violence     Fear of current or ex partner: Not on file     Emotionally abused: Not on file     Physically abused: Not on file     Forced sexual activity: Not on file   Other Topics Concern   . Not on file   Social History Narrative    Living in women's shelter- transitional housing          Review of System(ROS)  Review of the system was completed on today's evaluation including  Constitutional: Denies fever, chills or shakes  HEENT: Denies headache, visual disturbance, tinnitus or hearing loss.  Respiratory: Denies shortness of breath, wheezing, hemoptysis, coughing  Cardiovascular: Denies chest pain, shortness of breath, palpitation or diaphoresis  GI/GU: GI and GU were also reported to be negative  Musculoskeletal: Denies significant joint discomfort, pain or swelling involving upper or lower extremities      On Examination:  Awake and alert not in active distress, sitting comfortably on the examination couch.  Vitals:  BP 120/80   Pulse 64   Temp 98.5 F (36.9 C) (Oral)   Resp 20   Ht '5\' 4"'  (1.626 m)   Wt (!) 341 lb 12.8 oz (155 kg)   BMI 58.67 kg/m       Wound Examination:  The left leg ulcer is healed and epithelialized.  She continues to have significant lymphedema of the left lower extremity there was  also significant skin desquamation and dry skin was noticed.    Wound Data:                  Edema:    Left Lower:   Left Lower Extremity (LLE)  LLE Calf - Cm w/ Point of Measurement: 53.5 cm  LLE Calf - Cm from the Medial Instep: 28 cm  Right Lower:  Right Lower Extremity (RLE)  RLE Calf - Cm w/ Point of Measurement: 47.5 cm  RLE Calf - Cm from the Medial Instep: 28 cm        Debridement:  No debridement was performed on today's evaluation however using a circular curette #7 more than 10 dry skins lesions were removed from the left leg    LABS:  WBC   Date Value Ref Range Status   11/21/2018 9.56 4.3 - 10.0 10*3/uL Final     Hemoglobin A1C   Date Value Ref Range Status   07/28/2018 6.3 (H) 4.0 - 6.0 % Final   02/09/2017 6.1 (H) 4.0 - 6.0 % Final   08/05/2016 5.8 4.0 - 6.0 % Final     No components found for: BMP  No results found for: ESR  No components found for: HSCRP  No components found for: ALB  No results found for: TTHY    Relevant imaging data was also reviewed on today's evaluation.      ASSESSMENT/PLAN:    1) History of left leg ulcer with cellulitis.  Patient also reported that she had an abscess which was drained.  On today's evaluation however ulcers are improved however she continues to have significant swelling of the left lower extremity.  She will benefit from long-term compression wraps.  Will take a measurement for CircAid in the meantime she will resume her 2 layers Coban compression to the left lower extremity.  Risk and benefits associated with compression wraps were discussed with patient.  She was also instructed to keep the leg elevated.  She will be back in 3 days for nurse visit.    2) lymphedema.  As mentioned above patient will benefit from long-term compression use.    Please refer to physician orders in the patient's chart for the dressing recommendation and frequency of dressing changes.    Wound care and dietary instructions were also provided to the patient on today's evaluation.   Patient verbalized understanding.  Patient was also instructed to come back for follow-up evaluation in 1 week.  All important questions which patient raised on today's evaluation were answered.          Ardelle Anton MD  11/27/18  [facility]  [address] [place] [address]  Ph:   [phone]  Fax: [phone]        This document was generated using [employer].  Occasionally wrong words or sound "like" words substitution may have occurred due to inherent limitation of voice recognition software.  Read the chart carefully and recognize, using context where  the substitution of the words may have occurred.  Although every effort was made to edit the content of the transcription, some typing errors may still be present.  If still further clarification is required please contact to the wound care clinic at [facility].

## 2018-11-28 ENCOUNTER — Ambulatory Visit (HOSPITAL_BASED_OUTPATIENT_CLINIC_OR_DEPARTMENT_OTHER): Payer: Medicare HMO | Attending: Sleep Medicine | Admitting: Sleep Medicine

## 2018-11-28 DIAGNOSIS — G4733 Obstructive sleep apnea (adult) (pediatric): Secondary | ICD-10-CM | POA: Insufficient documentation

## 2018-11-28 LAB — HEPATITIS C AB W/O PCR: Hepatitis C Ab Result w/o PCR: NONREACTIVE
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
## 2018-11-28 NOTE — Patient Instructions (Signed)
Nice to speak with you today.    I have ordered a sleep lab study where CPAP will be tried. You will need to get a COVID test prior to the study. Someone from the sleep center will contact you to make these arrangement.  If no one contacts you, you can call   [phone] to schedule.    After the study I will order CPAP.

## 2018-11-30 ENCOUNTER — Ambulatory Visit (HOSPITAL_BASED_OUTPATIENT_CLINIC_OR_DEPARTMENT_OTHER): Payer: Medicare HMO

## 2018-12-01 ENCOUNTER — Ambulatory Visit (HOSPITAL_BASED_OUTPATIENT_CLINIC_OR_DEPARTMENT_OTHER): Payer: Medicare HMO

## 2018-12-01 VITALS — BP 120/70 | HR 84 | Temp 98.3°F | Resp 20

## 2018-12-01 DIAGNOSIS — R238 Other skin changes: Secondary | ICD-10-CM

## 2018-12-01 DIAGNOSIS — L97921 Non-pressure chronic ulcer of unspecified part of left lower leg limited to breakdown of skin: Secondary | ICD-10-CM

## 2018-12-01 DIAGNOSIS — I890 Lymphedema, not elsewhere classified: Secondary | ICD-10-CM

## 2018-12-01 NOTE — Progress Notes (Signed)
Dressing applied per provider order.

## 2018-12-01 NOTE — Progress Notes (Signed)
Visit date: 12/01/18  Time: 1345      Assessment:  Holly Blair is an 53 year old female seen today for left lower leg compression wrap change/nurse visit today. She was unable to come to clinic yesterday due to a family emergency and rescheduled for today. No s/s of infection noted today.     Edema:    Left Lower:   Left Lower Extremity (LLE)  LLE Calf - Cm w/ Point of Measurement: 47.5 cm  LLE Calf - Cm from the Medial Instep: 28 cm  Right Lower:       Procedure/Plan:    Orders Placed This Encounter   . APPL MLTLAYR COMPRES LEG BELOW KNEE W/ANKLE FOOT     Wound Care Instructions     Left Lower Leg     Wash   Warm water pat dry     Dressing   Compression Instructions:   Apply multilayer compression dressing to Left Lower leg     Cleanse wound with normal saline or water and cleanser,   Apply barrier cream to top of calf to secure prn,   Primary dressing: mepilex transfer   Secondary dressing: conform gauze   Apply Multilayer compression wrap: coban 2 layer   Stockinette or nylon to cover wrap,   Change dressing Weekly.   Elevate legs 4-6 times daily.   Keep dressing dry, and contact wound care center or home health agency if dressing becomes wet or slides down.   Never use scissors to remove dressing, always unravel dressing carefully if removal necessary.     KEEP THE WRAP DRY- IF YOU SHOWER PURCHASE A SHOWER BOOT TO COVER THE WRAP     Order Specific Question:   Performed by provider?     Answer:   No       Patient Instructions   Wound Care Instructions     Left Lower Leg     Wash   Warm water pat dry     Dressing   Compression Instructions:   Apply multilayer compression dressing to Left Lower leg     Cleanse wound with normal saline or water and cleanser,   Apply barrier cream to top of calf to secure prn,   Primary dressing: mepilex transfer   Secondary dressing: conform gauze   Apply Multilayer compression wrap: coban 2 layer   Stockinette or nylon to cover wrap,   Change dressing Weekly.   Elevate legs 4-6  times daily.   Keep dressing dry, and contact wound care center or home health agency if dressing becomes wet or slides down.   Never use scissors to remove dressing, always unravel dressing carefully if removal necessary.     KEEP THE WRAP DRY- IF YOU SHOWER PURCHASE A SHOWER BOOT TO COVER THE WRAP             Return in about 3 days (around 12/04/2018) for routine follow-up.    Subjective:  Patient is tolerating her compression wraps very well. Patient states that she was offered juxtalite wraps for $68 out of pocket, however she deferred secondary to cost.     Objective:  Vitals: BP 120/70   Pulse 84   Temp 98.3 F (36.8 C) (Oral)   Resp 20     Van Clines, RN, MSN, [place], [employer]

## 2018-12-01 NOTE — Patient Instructions (Signed)
Wound Care Instructions     Left Lower Leg     Wash   Warm water pat dry     Dressing   Compression Instructions:   Apply multilayer compression dressing to Left Lower leg     Cleanse wound with normal saline or water and cleanser,   Apply barrier cream to top of calf to secure prn,   Primary dressing: mepilex transfer   Secondary dressing: conform gauze   Apply Multilayer compression wrap: coban 2 layer   Stockinette or nylon to cover wrap,   Change dressing Weekly.   Elevate legs 4-6 times daily.   Keep dressing dry, and contact wound care center or home health agency if dressing becomes wet or slides down.   Never use scissors to remove dressing, always unravel dressing carefully if removal necessary.     KEEP THE WRAP DRY- IF YOU SHOWER PURCHASE A SHOWER BOOT TO [place] THE WRAP

## 2018-12-04 ENCOUNTER — Ambulatory Visit (HOSPITAL_BASED_OUTPATIENT_CLINIC_OR_DEPARTMENT_OTHER): Payer: Medicare HMO | Admitting: Internal Medicine

## 2018-12-04 VITALS — BP 122/60 | HR 72 | Temp 97.9°F | Resp 16 | Wt 340.2 lb

## 2018-12-04 DIAGNOSIS — I87312 Chronic venous hypertension (idiopathic) with ulcer of left lower extremity: Secondary | ICD-10-CM

## 2018-12-04 DIAGNOSIS — I890 Lymphedema, not elsewhere classified: Secondary | ICD-10-CM

## 2018-12-04 DIAGNOSIS — L97929 Non-pressure chronic ulcer of unspecified part of left lower leg with unspecified severity: Secondary | ICD-10-CM

## 2018-12-04 DIAGNOSIS — L97921 Non-pressure chronic ulcer of unspecified part of left lower leg limited to breakdown of skin: Secondary | ICD-10-CM

## 2018-12-04 NOTE — Progress Notes (Signed)
No change in meds.     Left pretibial wound appears healed

## 2018-12-04 NOTE — Progress Notes (Signed)
Reason for Visit:  Follow-up nonhealing left leg venous ulcer along with significant edema    Interval History:  53 years old lady was back at [facility] wound care clinic for follow-up evaluation of nonhealing left leg ulcer associated with venous insufficiency and significant edema.  Patient reported the left leg ulcer is completely healed and closed.  We prescribed CircAid for her however her insurance denied it.    On Examination:  Awake and alert not in active distress, sitting comfortably on the examination couch.    Vitals:  BP 122/60   Pulse 72   Temp 97.9 F (36.6 C) (Oral)   Resp 16   Wt (!) 340 lb 3.2 oz (154.3 kg)   BMI 58.40 kg/m     Wound Examination:  Left leg ulcer is now completely healed and closed.  Wound Data:          Ulcer Data:          Edema:    Left Lower:   Left Lower Extremity (LLE)  LLE Calf - Cm w/ Point of Measurement: 47.5 cm  LLE Calf - Cm from the Medial Instep: 28 cm  Right Lower:  Right Lower Extremity (RLE)  RLE Calf - Cm from the Medial Instep: 28 cm      Debridement:  No debridement was performed on today's evaluation  LABS:  WBC   Date Value Ref Range Status   11/21/2018 9.56 4.3 - 10.0 10*3/uL Final     Hemoglobin A1C   Date Value Ref Range Status   07/28/2018 6.3 (H) 4.0 - 6.0 % Final   02/09/2017 6.1 (H) 4.0 - 6.0 % Final   08/05/2016 5.8 4.0 - 6.0 % Final     No components found for: BMP  No results found for: ESR  No components found for: HSCRP  No components found for: ALB  No results found for: TTHY    ASSESSMENT/PLAN:    1) nonhealing left leg ulcer, the ulcer itself is completely healed and closed.  Patient be discharged from the wound care clinic.  She was however instructed to continue to use compression she will be using compressive grip.  She could not afford to buy CircAid's.  She reported that the insurance is not covering her CircAid.  We also provided counseling to the patient regarding the importance of keeping the legs elevated while sitting.   She was instructed about restricting salt in free water intake in her diet.    2) venous insufficiency.  As mentioned above patient will benefit from long-term compression use and keeping the legs elevated.        Please refer to physician orders in the patient's chart for the dressing recommendation and frequency of dressing changes.    Wound care and dietary instructions were also provided to the patient on today's evaluation.  Patient verbalized understanding.  Patient will be discharged from the wound care clinic with instruction to come back if she develops recurrent ulcers.   All important questions which patient raised on today's evaluation were answered.          Ardelle Anton MD  12/04/18  [place]  [address], [place] [address]  Ph:   [phone]  Fax: [phone]      This document was generated using [employer].  Occasionally wrong words or sound "like" words substitution may have occurred due to inherent limitation  of voice recognition software.  Read the chart carefully and recognize, using context where the substitution of the words may have occurred.  Although every effort was made to edit the content of the transcription, some typing errors may still be present.  If still further clarification is required please contact to the wound care clinic at [facility].

## 2018-12-04 NOTE — Progress Notes (Signed)
Venous Insufficiency discharge teaching sheet     Keep your legs elevated for 30 minute periods for ___4__ times a day.  ? higher than the level of your heart.  ? higher than the level of your hips.  ? high as your hips.  ? as tolerated.    Use CIRCAID  stocking / compressogrip / compression garments  [employer]- [phone]   ? put on first thing in the morning and remove at bedtime.  ? if stocking used daily, replace in 6 months (2 pairs used - change in 1 year).   ? Do NOT wash stockings in washer or put in drier.  Follow directions.    ? Avoid injuring your legs and feet.    ? Do NOT scratch your legs.    ? Do NOT use hot pads, hot bottles, heat       lamps, hot or cold soaks or        ice packs on your legs.    ? Checks your legs and feet daily.    ? Keep legs and feet clean.   ? Dry thoroughly after bathing or showers, DRY between your toes.   ? If webspace (space between your toes) are macerated (wet with skin white       and  peeling), thread lambswool between your toes.  ? Lambswool can be purchased at [employer], in foot care aisle.  APPLY CREAM TO LEGS AS NEEDED FOR DRY SKIN

## 2018-12-04 NOTE — Patient Instructions (Signed)
Venous Insufficiency discharge teaching sheet     Keep your legs elevated for 30 minute periods for ___4__ times a day.  ? higher than the level of your heart.  ? higher than the level of your hips.  ? high as your hips.  ? as tolerated.    Use CIRCAID  stocking / compressogrip / compression garments  [employer]- [phone]   ? put on first thing in the morning and remove at bedtime.  ? if stocking used daily, replace in 6 months (2 pairs used - change in 1 year).   ? Do NOT wash stockings in washer or put in drier.  Follow directions.    ? Avoid injuring your legs and feet.    ? Do NOT scratch your legs.    ? Do NOT use hot pads, hot bottles, heat       lamps, hot or cold soaks or        ice packs on your legs.    ? Checks your legs and feet daily.    ? Keep legs and feet clean.   ? Dry thoroughly after bathing or showers, DRY between your toes.   ? If webspace (space between your toes) are macerated (wet with skin white       and  peeling), thread lambswool between your toes.  ? Lambswool can be purchased at [employer], in foot care aisle.  APPLY CREAM TO LEGS AS NEEDED FOR DRY SKIN

## 2018-12-06 ENCOUNTER — Encounter (INDEPENDENT_AMBULATORY_CARE_PROVIDER_SITE_OTHER): Payer: Self-pay

## 2018-12-08 ENCOUNTER — Ambulatory Visit: Payer: Medicare HMO | Attending: Naturopath | Admitting: Naturopath

## 2018-12-08 DIAGNOSIS — D509 Iron deficiency anemia, unspecified: Secondary | ICD-10-CM | POA: Insufficient documentation

## 2018-12-08 DIAGNOSIS — Z5181 Encounter for therapeutic drug level monitoring: Secondary | ICD-10-CM | POA: Insufficient documentation

## 2018-12-08 DIAGNOSIS — B372 Candidiasis of skin and nail: Secondary | ICD-10-CM | POA: Insufficient documentation

## 2018-12-08 MED ORDER — NYSTATIN 100000 UNIT/GM EX POWD
CUTANEOUS | 2 refills | Status: DC
Start: 2018-12-08 — End: 2020-01-01

## 2018-12-08 MED ORDER — NYSTATIN 100000 UNIT/GM EX CREA
TOPICAL_CREAM | CUTANEOUS | 0 refills | Status: DC
Start: 2018-12-08 — End: 2020-01-01
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
## 2018-12-11 ENCOUNTER — Encounter (HOSPITAL_BASED_OUTPATIENT_CLINIC_OR_DEPARTMENT_OTHER): Payer: Medicare HMO | Admitting: Naturopath

## 2018-12-27 ENCOUNTER — Other Ambulatory Visit (HOSPITAL_BASED_OUTPATIENT_CLINIC_OR_DEPARTMENT_OTHER): Payer: Self-pay | Admitting: Naturopath

## 2018-12-27 ENCOUNTER — Ambulatory Visit: Payer: Medicare HMO | Attending: Naturopath | Admitting: Naturopath

## 2018-12-27 VITALS — BP 122/72 | HR 76 | Wt 344.2 lb

## 2018-12-27 DIAGNOSIS — Z5181 Encounter for therapeutic drug level monitoring: Secondary | ICD-10-CM | POA: Insufficient documentation

## 2018-12-27 DIAGNOSIS — D509 Iron deficiency anemia, unspecified: Secondary | ICD-10-CM | POA: Insufficient documentation

## 2018-12-27 DIAGNOSIS — E7800 Pure hypercholesterolemia, unspecified: Secondary | ICD-10-CM

## 2018-12-27 DIAGNOSIS — Z0289 Encounter for other administrative examinations: Secondary | ICD-10-CM | POA: Insufficient documentation

## 2018-12-27 LAB — LAB ADD ON ORDER

## 2018-12-27 LAB — COMPREHENSIVE METABOLIC PANEL
ALT (GPT): 10 U/L (ref 7–33)
AST (GOT): 12 U/L (ref 9–38)
Albumin: 3.8 g/dL (ref 3.5–5.2)
Alkaline Phosphatase (Total): 97 U/L (ref 34–121)
Anion Gap: 5 (ref 4–12)
Bilirubin (Total): 0.7 mg/dL (ref 0.2–1.3)
Calcium: 9 mg/dL (ref 8.9–10.2)
Carbon Dioxide, Total: 30 meq/L (ref 22–32)
Chloride: 102 meq/L (ref 98–108)
Creatinine: 0.88 mg/dL (ref 0.38–1.02)
Glucose: 109 mg/dL (ref 62–125)
Potassium: 4.2 meq/L (ref 3.6–5.2)
Protein (Total): 7.9 g/dL (ref 6.0–8.2)
Sodium: 137 meq/L (ref 135–145)
Urea Nitrogen: 12 mg/dL (ref 8–21)
eGFR by CKD-EPI: >60 mL/min/{1.73_m2} (ref >59)

## 2018-12-27 LAB — CBC, DIFF
% Basophils: 0 %
% Eosinophils: 1 %
% Immature Granulocytes: 1 %
% Lymphocytes: 29 %
% Monocytes: 5 %
% Neutrophils: 64 %
% Nucleated RBC: 0 %
Absolute Eosinophil Count: 0.12 10*3/uL (ref 0.00–0.50)
Absolute Lymphocyte Count: 3.18 10*3/uL (ref 1.00–4.80)
Basophils: 0.03 10*3/uL (ref 0.00–0.20)
Hematocrit: 32 % — ABNORMAL LOW (ref 36–45)
Hemoglobin: 9.3 g/dL — ABNORMAL LOW (ref 11.5–15.5)
Immature Granulocytes: 0.05 10*3/uL (ref 0.00–0.05)
MCH: 23.7 pg — ABNORMAL LOW (ref 27.3–33.6)
MCHC: 28.8 g/dL — ABNORMAL LOW (ref 32.2–36.5)
MCV: 82 fL (ref 81–98)
Monocytes: 0.59 10*3/uL (ref 0.00–0.80)
Neutrophils: 7.01 10*3/uL — ABNORMAL HIGH (ref 1.80–7.00)
Nucleated RBC: 0 10*3/uL
Platelet Count: 367 10*3/uL (ref 150–400)
RBC: 3.92 10*6/uL (ref 3.80–5.00)
RDW-CV: 18.9 % — ABNORMAL HIGH (ref 11.6–14.4)
WBC: 10.98 10*3/uL — ABNORMAL HIGH (ref 4.3–10.0)

## 2018-12-27 LAB — LIPID PANEL
Cholesterol (LDL): 101 mg/dL (ref <130)
Cholesterol/HDL Ratio: 3.9
HDL Cholesterol: 42 mg/dL (ref >39)
Non-HDL Cholesterol: 123 mg/dL (ref 0–159)
Total Cholesterol: 165 mg/dL (ref <200)
Triglyceride: 109 mg/dL (ref <150)

## 2018-12-27 NOTE — Progress Notes (Signed)
SUBJECTIVE:   Holly Blair is a 53 year old female patient who presents with the following concern:     Chief Complaint: forms completion    She would like to have the DART application completed for transit.  She has OA of both knees which causes severe pain with ambulation.  She has to use a walked. She also has a gait abnormality, morbid obesity, and pulmonary HTN.    Review of Systems   Constitution: Negative for malaise/fatigue, weight gain and weight loss.   Cardiovascular: Negative for chest pain, irregular heartbeat and leg swelling.   Respiratory: Negative for cough, shortness of breath and wheezing.    Musculoskeletal: Positive for back pain and joint pain. Negative for falls.         Patient Active Problem List   Diagnosis   . Depression, major, recurrent ([place])   . Chronic knee pain   . Chronic back pain   . Senile nuclear cataract   . Myopia with astigmatism and presbyopia   . Morbid obesity with BMI of 50.0-59.9, adult ([place])   . Hyperlipidemia   . Systolic murmur   . Anxiety   . Rosacea   . Essential hypertension   . Pulmonary HTN ([place])   . Prediabetes       Current Outpatient Medications   Medication Sig Dispense Refill   . acetaminophen 325 MG tablet Take 2 tablets (650 mg) by mouth every 4 hours as needed (pain). 60 tablet 1   . Acetaminophen 500 MG Oral Tab Take 1 tablet (500 mg) by mouth every 6 hours as needed for pain. (Patient taking differently: Take 500 mg by mouth every 6 hours as needed for pain. ) 60 tablet 0   . amLODIPine 10 MG tablet Take 1 tablet (10 mg) by mouth daily. 90 tablet 3   . atorvastatin 20 MG tablet Take 1 tablet (20 mg) by mouth daily. 90 tablet 3   . buPROPion SR 150 MG 12 hr tablet Take 1 tablet (150 mg) by mouth 2 times a day. 180 tablet 1   . clotrimazole 1 % cream Apply to affected area on abdomen 2 times a day. For fungal rash. 45 g 2   . FLUoxetine 20 MG capsule Take 3 capsules (60 mg) by mouth daily. 270 capsule 3   . furosemide 20 MG tablet Take 20 mg by mouth daily.     .  Ibuprofen 600 MG Oral Tab take 1 tablet by mouth every 6 hours with food AND PLENTY OF WATER if needed for pain 90 tablet 0   . IRON (FERROUS SULFATE) OR Take by mouth.     . metFORMIN 500 MG tablet 500 mg daily at night for 5 days, increase to 500 mg twice daily, in two weeks increase to 500 mg in the morning and 1000 mg at night.  In one week increase to 1000 mg twice daily 120 tablet 4   . metroNIDAZOLE 0.75 % topical gel Apply to affected area on face 2 times a day. 1 Tube 3   . mupirocin 2 % ointment Apply to open lesions on lower legs and ankles three times daily 1 Tube 0   . nystatin 100000 UNIT/GM cream Apply To the affected areas under breasts twice daily as directed 30 g 0   . nystatin 100000 UNIT/GM powder Apply to affected area twice daily 1 bottle 2   . oxybutynin 5 MG tablet Take 1 tablet (5 mg) by mouth 2 times a day. Take  up to 3 times a day 90 tablet 12   . permethrin 5 % cream Apply from head to feet. Leave on for 8-14 hours, then wash off with soap and water. May reapply in 1 week if live mites appear. 60 g 0   . triamcinolone 0.1 % cream Apply to affected area on leg(s) 2 times a day. 1 Tube 3     No current facility-administered medications for this visit.        Review of patient's allergies indicates:  No Known Allergies    OBJECTIVE:  General: Alert, no acute distress   BP 122/72   Pulse 76   Wt (!) 344 lb 3.2 oz (156.1 kg)   BMI 59.08 kg/m   General appearance: healthy, alert, no distress  Mood/affect: Pleasant female. Mood and affect appropriate.    ASSESSMENT/PLAN:    [name] was seen today for forms completion and lab test.    Diagnoses and all orders for this visit:    Encounter for completion of form with patient  Dart Application completed and sent in    Medication monitoring encounter  -     Lab Add On Order, Non-Micro    Waiting for results of CBC, electrolytes, and lipids    She has an appointment with Cardiology in early August. Follow-up with me in 6-8 weeks

## 2019-01-23 ENCOUNTER — Ambulatory Visit (HOSPITAL_BASED_OUTPATIENT_CLINIC_OR_DEPARTMENT_OTHER): Payer: Medicare HMO | Admitting: Cardiovascular Disease

## 2019-01-23 ENCOUNTER — Encounter (HOSPITAL_BASED_OUTPATIENT_CLINIC_OR_DEPARTMENT_OTHER): Payer: Self-pay

## 2019-01-25 ENCOUNTER — Encounter (HOSPITAL_BASED_OUTPATIENT_CLINIC_OR_DEPARTMENT_OTHER): Payer: Self-pay

## 2019-01-30 ENCOUNTER — Encounter (HOSPITAL_BASED_OUTPATIENT_CLINIC_OR_DEPARTMENT_OTHER): Payer: Self-pay | Admitting: Naturopath

## 2019-01-30 ENCOUNTER — Telehealth (HOSPITAL_BASED_OUTPATIENT_CLINIC_OR_DEPARTMENT_OTHER): Payer: Medicare HMO | Admitting: Cardiovascular Disease

## 2019-01-30 ENCOUNTER — Encounter (HOSPITAL_BASED_OUTPATIENT_CLINIC_OR_DEPARTMENT_OTHER): Payer: Self-pay

## 2019-01-30 DIAGNOSIS — Z1211 Encounter for screening for malignant neoplasm of colon: Secondary | ICD-10-CM

## 2019-02-02 NOTE — Telephone Encounter (Signed)
Please send patient a fit test

## 2019-02-04 NOTE — Progress Notes (Signed)
[facility] PATIENT VISIT  TELEMEDICINE VISIT      Name:  Holly Blair  Date of Birth: Dec 18, 1965  Medical Record Number: [id number]  Primary Care Physician:  Eldred Manges, ARNP  Referring Provider:  Eldred Manges, ARNP    Date of Visit: 02/05/2019     Distant Site Telemedicine Encounter    I conducted this encounter from [facility] via secure, live, face-to-face video conference with the patient. Holly Blair was located at her home by herself. Prior to the interview, the risks and benefits of telemedicine were discussed with the patient and verbal consent was obtained.      REASON FOR REFERRAL:  Abnormal echocardiogram    HISTORY OF PRESENT ILLNESS:  Holly Blair is a 53 year old female with OSA, obesity, iron def. anemia, pulmonary hypertension, and chronic venous insufficiency who is seen today in clinic for evaluation of abnormal echocardiogram.  Holly Blair was referred for TTE this past February for evaluation of a heart murmur which was notable for normal ventricular size and function, mild-moderate AS, and mild pulmonary hypertension. She was referred for cardiology evaluation, however in the interim, she noted increasing shortness of breath with exertional dyspnea upon walking to the bathroom or kitchen. Also noted increasing water weight in her lower extremities during this time. She was evaluated at [facility] ED at the end of May for shortness of breath with workup including a CT-PE and TTE. She was treating for presumed pneumonia. CT chest notable for enlarged main pulmonary artery and TTE w/ diastolic dysfunction with elevated filling pressures. She was Rx furosemide at that time     Since being on diuretics, she has not had recurrence of her dyspnea, her b/l LE edema has improved considerably and her chronic leg ulcers have improved as well.  Her current weight is 330lbs, down from 344 at her last visit in July. She keeps a log of her BP's and  weights.    Currently, she denies shortness of breath, chest pain, PND, orthopnea, or syncope. She overall feels that since being on the diuretics she has improved beyond her previous baseline.       Past medical, past surgical, family, and social history was reviewed and updated as needed during patient's visit today.   PAST MEDICAL HISTORY  Patient Active Problem List   Diagnosis   . Depression, major, recurrent ([place])   . Chronic knee pain   . Chronic back pain   . Senile nuclear cataract   . Myopia with astigmatism and presbyopia   . Morbid obesity with BMI of 50.0-59.9, adult ([place])   . Hyperlipidemia   . Systolic murmur   . Anxiety   . Rosacea   . Essential hypertension   . Pulmonary HTN ([place])   . Prediabetes   . Heart failure with preserved ejection fraction [facility])       Past Surgical History:   Procedure Laterality Date   . LIG/TRNSXJ FLP TUBE ABDL/VAG APPR UNI/BI         ALLERGIES  Review of patient's allergies indicates:  No Known Allergies    CURRENT MEDICATIONS:  Current Outpatient Medications   Medication Sig Dispense Refill   . acetaminophen 325 MG tablet Take 2 tablets (650 mg) by mouth every 4 hours as needed (pain). 60 tablet 1   . amLODIPine 10 MG tablet Take 1 tablet (10 mg) by mouth daily. 90 tablet 3   . atorvastatin 20 MG tablet  Take 1 tablet (20 mg) by mouth daily. 90 tablet 3   . buPROPion SR 150 MG 12 hr tablet Take 1 tablet (150 mg) by mouth 2 times a day. 180 tablet 1   . clotrimazole 1 % cream Apply to affected area on abdomen 2 times a day. For fungal rash. 45 g 2   . FLUoxetine 20 MG capsule Take 3 capsules (60 mg) by mouth daily. 270 capsule 3   . furosemide 20 MG tablet Take 20 mg by mouth daily.     . Ibuprofen 600 MG Oral Tab take 1 tablet by mouth every 6 hours with food AND PLENTY OF WATER if needed for pain 90 tablet 0   . Iron (Ferrous Sulfate) 142 (45 Fe) MG tablet extended-release Take 1 tablet by mouth daily.      . metFORMIN 500 MG tablet 500 mg daily at night for 5 days,  increase to 500 mg twice daily, in two weeks increase to 500 mg in the morning and 1000 mg at night.  In one week increase to 1000 mg twice daily 120 tablet 4   . metroNIDAZOLE 0.75 % topical gel Apply to affected area on face 2 times a day. 1 Tube 3   . mupirocin 2 % ointment Apply to open lesions on lower legs and ankles three times daily 1 Tube 0   . nystatin 100000 UNIT/GM cream Apply To the affected areas under breasts twice daily as directed 30 g 0   . nystatin 100000 UNIT/GM powder Apply to affected area twice daily 1 bottle 2   . oxybutynin 5 MG tablet Take 1 tablet (5 mg) by mouth 2 times a day. Take up to 3 times a day 90 tablet 12   . triamcinolone 0.1 % cream Apply to affected area on leg(s) 2 times a day. 1 Tube 3     No current facility-administered medications for this visit.        FAMILY HISTORY   Family History     Problem (# of Occurrences) Relation (Name,Age of Onset)    Cancer (1) Mother: lung cancer, long history of smkoing    Colorectal Cancer (1) Maternal Grandmother    Hypertension (1) Maternal Grandfather       Negative family history of: Uterine Cancer, Prolapse, Incontinence          SOCIAL HISTORY:   Social History     Social History Narrative    Living in [employer] shelter- transitional housing      Tobacco: never  Alcohol: none  Drugs: none  Residence/employment: Lives in [place] but stays with her boyfriend in [place] since covid. Previously homeless.    REVIEW OF SYSTEMS:    As noted above. Otherwise, remaining ROS negative except as noted in history of present illness and past medical history.    PHYSICAL EXAM  There were no vitals taken for this visit.  General: Well-appearing   Neuro/Psych: CN's II - XII grossly intact and mood and affect appropriate    LABS:  WBC   Date Value Ref Range Status   12/27/2018 10.98 (H) 4.3 - 10.0 10*3/uL Final   11/21/2018 9.56 4.3 - 10.0 10*3/uL Final   11/20/2018 10.50 (H) 4.3 - 10.0 10*3/uL Final     B_Type Natriuretic Peptide   Date Value Ref  Range Status   11/19/2018 110 (H) <101 pg/mL Final     Wt Readings from Last 3 Encounters:   12/27/18 (!) 344 lb 3.2 oz (156.1  kg)   12/04/18 (!) 340 lb 3.2 oz (154.3 kg)   11/27/18 (!) 341 lb 12.8 oz (155 kg)     BP Readings from Last 3 Encounters:   12/27/18 122/72   12/04/18 122/60   12/01/18 120/70     Results for orders placed or performed in visit on 12/27/18   Lipid Panel   Result Value Ref Range    Cholesterol (Total) 165 <200 mg/dL    Triglyceride 109 <150 mg/dL    Cholesterol (HDL) 42 >39 mg/dL    Cholesterol (LDL) 101 <130 mg/dL    Non-HDL Cholesterol 123 0 - 159 mg/dL    Cholesterol/HDL Ratio 3.9     Lipid Panel, Additional Info. (NOTE)      Labs discussed with patient and provided in after visit summary.     PRIOR CARDIAC STUDIES:    ECHO:   TTE (date: 11/20/18)  1. Normal LV size and systolic function  2. Evidence of diastolic compliance abnormality  3. Mild aortic stenosis  4. Mitral annular calciafication with 4 mm gradient  E/e' is increased and indicative of increased filling pressures.    TTE (date: 08/17/18)  Normal LV size and systolic function (EF 67%). No regional wall motion abnormalities.  Normal RV size and function.   Trace to mild aortic insufficiency. Mild-moderate aortic stenosis. Otherwise normal valvular function.   Mild pulmonary hypertension. Estimated PASP 39-55mmHg based on an estimated right atrial pressure of 5-72mmHg.  No pericardial effusion.     Other Imaging:   CT Chest PE Protocol (date: 11/19/18)  No evidence of pulmonary embolism.  -Mildly enlarged bilateral axillary lymph nodes, indeterminate in etiology.   Consider further investigation if clinically indicated.  -Dilated main pulmonary artery and reversal of main pulmonary artery to aorta   ratio indicates presence of pulmonary hypertension.  -Combination of groundglass opacity, minimal septal thickening, and bilateral   pleural effusions represents presence of pulmonary edema.      ASSESSMENT/PLAN:  53yo woman with  history of OSA, obesity, IDA, pulmonary hypertension, and chronic venous insufficiency who presents for new patient evaluation of diastolic dysfunction on echocardiogram.    1.) HFpEF  Evidence of diastolic dysfunction with elevated filling pressures despite difficult studies. No overt evidence of infiltrative disorders. Symptoms markedly improved following diuretic therapy currently. Discussed the diagnosis of HFpEF and symptom management with diuretics. Will plan to have patient continue current dose of furosemide and be seen in-person to assess fluid status. Recommended good BP control and treatment of OSA.   -- continue furosemide 20mg   -- continue weight log  -- in-person visit in 27months    2.) Mild Aortic Stenosis  Difficult echo quality but likely at least mild aortic stenosis. Plan to repeat echo in 44yr and likely space out if unchanged.  -- repeat echocardiogram in 36yr    3.) HTN  Continue aggressive management of hypertension  -- continue amlodipine   -- return to clinic with BP log      Patient to return to clinic in 23months.    This patient was seen and discussed with Dr. Levy Sjogren      Patient Instructions   It was good to see you today. Today we discussed the diagnosis of heart failure with preserved ejection fraction. This means your heart is stiff (does not relax correctly) but squeezes correctly. This is managed with diuretics (fluid pill/furosemide). Please keep a log of your weights and blood pressure for next visit in 3 months. We will repeat  an echocardiogram in 31yr to look at the valves and heart function.        Zadie Cleverly, MD  Fellow, Division of Cardiology  Pager: [phone]  02/04/2019

## 2019-02-05 ENCOUNTER — Encounter (HOSPITAL_BASED_OUTPATIENT_CLINIC_OR_DEPARTMENT_OTHER): Payer: Self-pay

## 2019-02-05 ENCOUNTER — Ambulatory Visit: Payer: Medicare HMO | Attending: Cardiology | Admitting: Cardiology

## 2019-02-05 DIAGNOSIS — I350 Nonrheumatic aortic (valve) stenosis: Secondary | ICD-10-CM | POA: Insufficient documentation

## 2019-02-05 DIAGNOSIS — I10 Essential (primary) hypertension: Secondary | ICD-10-CM | POA: Insufficient documentation

## 2019-02-05 DIAGNOSIS — I5030 Unspecified diastolic (congestive) heart failure: Secondary | ICD-10-CM | POA: Insufficient documentation

## 2019-02-05 NOTE — Telephone Encounter (Signed)
FIT test sent to patient home address.  Patient was notified.

## 2019-02-05 NOTE — Progress Notes (Signed)
Attending Attestation:    I interviewed and examined the patient on the day of the encounter. I formulated a plan with Dr. Soledad Gerlach, as documented above. I agree with the signed noted, which I have edited where appropriate.    Briefly, Holly Blair is a 53 yo woman with h/o obesity, OSA, chronic venous stasis and HTN who was referred to cardiology clinic for further evaluation of abnormal echo findings. She reportedly had an echo performed in 07/2018 for murmur which showed mild-moderate AS and PH. She was admitted to [facility] for volume overload and repeat ECHO showed diastolic dysfunction, mild AS and trace-mild MS.    She reports weight reduction and improve edema on the Lasix 20 mg once a day. She is recording her weights daily in a log.    Will see her back in clinic in 3 months for in person assessment. She'll bring her BP/weight log.    She also had findings of mild AS/trace-mild MS on her ECHO. Will repeat in 1 year, if stable will extended out the screening intervals.    Author:  Levy Sjogren, [place], [place]

## 2019-02-05 NOTE — Patient Instructions (Signed)
It was good to see you today. Today we discussed the diagnosis of heart failure with preserved ejection fraction. This means your heart is stiff (does not relax correctly) but squeezes correctly. This is managed with diuretics (fluid pill/furosemide). Please keep a log of your weights and blood pressure for next visit in 3 months. We will repeat an echocardiogram in 26yr to look at the valves and heart function.

## 2019-02-22 ENCOUNTER — Encounter (HOSPITAL_BASED_OUTPATIENT_CLINIC_OR_DEPARTMENT_OTHER): Payer: Self-pay

## 2019-02-22 ENCOUNTER — Ambulatory Visit: Payer: Medicare HMO | Attending: Naturopath | Admitting: Naturopath

## 2019-02-22 DIAGNOSIS — I10 Essential (primary) hypertension: Secondary | ICD-10-CM | POA: Insufficient documentation

## 2019-02-22 DIAGNOSIS — F419 Anxiety disorder, unspecified: Secondary | ICD-10-CM | POA: Insufficient documentation

## 2019-02-22 DIAGNOSIS — Z1329 Encounter for screening for other suspected endocrine disorder: Secondary | ICD-10-CM | POA: Insufficient documentation

## 2019-02-22 DIAGNOSIS — F331 Major depressive disorder, recurrent, moderate: Secondary | ICD-10-CM | POA: Insufficient documentation

## 2019-02-22 DIAGNOSIS — N3941 Urge incontinence: Secondary | ICD-10-CM | POA: Insufficient documentation

## 2019-02-22 DIAGNOSIS — R7309 Other abnormal glucose: Secondary | ICD-10-CM | POA: Insufficient documentation

## 2019-02-22 DIAGNOSIS — E7800 Pure hypercholesterolemia, unspecified: Secondary | ICD-10-CM | POA: Insufficient documentation

## 2019-02-22 DIAGNOSIS — I272 Pulmonary hypertension, unspecified: Secondary | ICD-10-CM | POA: Insufficient documentation

## 2019-02-22 MED ORDER — METFORMIN HCL 1000 MG OR TABS
1000.0000 mg | ORAL_TABLET | Freq: Two times a day (BID) | ORAL | 3 refills | Status: DC
Start: 2019-02-22 — End: 2020-02-29

## 2019-02-22 MED ORDER — BUPROPION HCL ER (SR) 200 MG OR TB12
200.0000 mg | EXTENDED_RELEASE_TABLET | Freq: Two times a day (BID) | ORAL | 1 refills | Status: DC
Start: 2019-02-22 — End: 2019-07-19

## 2019-02-22 MED ORDER — ATORVASTATIN CALCIUM 20 MG OR TABS
20.0000 mg | ORAL_TABLET | Freq: Every day | ORAL | 3 refills | Status: DC
Start: 2019-02-22 — End: 2019-07-19

## 2019-02-22 MED ORDER — FLUOXETINE HCL 20 MG OR CAPS
60.0000 mg | ORAL_CAPSULE | Freq: Every day | ORAL | 3 refills | Status: DC
Start: 2019-02-22 — End: 2020-09-03

## 2019-02-22 MED ORDER — AMLODIPINE BESYLATE 10 MG OR TABS
10.0000 mg | ORAL_TABLET | Freq: Every day | ORAL | 3 refills | Status: DC
Start: 2019-02-22 — End: 2020-02-11

## 2019-02-22 MED ORDER — OXYBUTYNIN CHLORIDE 5 MG OR TABS
5.0000 mg | ORAL_TABLET | Freq: Two times a day (BID) | ORAL | 12 refills | Status: DC
Start: 2019-02-22 — End: 2020-12-23

## 2019-02-22 MED ORDER — FUROSEMIDE 20 MG OR TABS
20.0000 mg | ORAL_TABLET | Freq: Every day | ORAL | 1 refills | Status: DC
Start: 2019-02-22 — End: 2019-10-01

## 2019-02-22 NOTE — Progress Notes (Signed)
Is this visit being conducted using Telemedicine with live, interactive video and audio? Telemedicine (live, interactive video & audio)     Distant Site Telemedicine Encounter    I conducted this encounter from [facility] via secure, live, face-to-face video conference with the patient. Holly Blair was located at home. Prior to the interview, the risks and benefits of telemedicine were discussed with the patient and verbal consent was obtained.    Telemedicine Consent   This visit was conducted via Telehealth in lieu of an in-person encounter in my clinic due to the current COVID-19 outbreak and the risk that an in-person visit could pose to the patient, clinic staff, or the general public.  I confirmed the patient's name, date of birth, and callback number:YES  I introduced myself and showed my badge: YES  I explained that this visit will be billed the same as a face-to-face visit and patient consented to use telemedicine for their medical care today:YES  Patient reported all questions regarding telemedicine have been answered: YES    I received consent from the patient to have the visit by secure audio and video, including the statement: "I cannot provide the same evaluation as in a face-to-face visit. I may need you to come in for further evaluation or care. If at any time your would like to be seen in-person, we will terminate the visit and connect you to the most feasible in-person care. The technology is encrypted and secure; however, no technology is 100% hack-proof. The technology is dependent on a reliable internet connection."    SUBJECTIVE:   Holly Blair is a 53 year old female patient who presents with the following concern:     Chief Complaint: follow-up    She has been losing weight. She has increased walking and vegetable intake. She is checking weight regularly because of her diuretic.    She would like to get her thyroid checked. She is concerned that she might have low thyroid.     She ran out of  Fluoxetine, Buspirone, Metformin, Amlodipine, Oxybutynin, Furosemide and Atorvastatin.     She has not gotten an appointment with [facility]. She has called and left message. Referral is still in process.    Review of Systems   Constitution: Positive for malaise/fatigue and weight loss.   Cardiovascular: Negative for chest pain.   Respiratory: Negative for cough and shortness of breath.          Patient Active Problem List   Diagnosis   . Depression, major, recurrent ([place])   . Chronic knee pain   . Chronic back pain   . Senile nuclear cataract   . Myopia with astigmatism and presbyopia   . Morbid obesity with BMI of 50.0-59.9, adult ([place])   . Hyperlipidemia   . Systolic murmur   . Anxiety   . Rosacea   . Essential hypertension   . Pulmonary HTN ([place])   . Prediabetes   . Heart failure with preserved ejection fraction (HCC)       Current Outpatient Medications   Medication Sig Dispense Refill   . acetaminophen 325 MG tablet Take 2 tablets (650 mg) by mouth every 4 hours as needed (pain). 60 tablet 1   . amLODIPine 10 MG tablet Take 1 tablet (10 mg) by mouth daily. 90 tablet 3   . atorvastatin 20 MG tablet Take 1 tablet (20 mg) by mouth daily. 90 tablet 3   . buPROPion SR 150 MG 12 hr tablet Take 1 tablet (150  mg) by mouth 2 times a day. 180 tablet 1   . clotrimazole 1 % cream Apply to affected area on abdomen 2 times a day. For fungal rash. 45 g 2   . FLUoxetine 20 MG capsule Take 3 capsules (60 mg) by mouth daily. 270 capsule 3   . furosemide 20 MG tablet Take 20 mg by mouth daily.     . Ibuprofen 600 MG Oral Tab take 1 tablet by mouth every 6 hours with food AND PLENTY OF WATER if needed for pain 90 tablet 0   . Iron (Ferrous Sulfate) 142 (45 Fe) MG tablet extended-release Take 1 tablet by mouth daily.      . metFORMIN 500 MG tablet 500 mg daily at night for 5 days, increase to 500 mg twice daily, in two weeks increase to 500 mg in the morning and 1000 mg at night.  In one week increase to 1000 mg twice  daily 120 tablet 4   . metroNIDAZOLE 0.75 % topical gel Apply to affected area on face 2 times a day. 1 Tube 3   . mupirocin 2 % ointment Apply to open lesions on lower legs and ankles three times daily 1 Tube 0   . nystatin 100000 UNIT/GM cream Apply To the affected areas under breasts twice daily as directed 30 g 0   . nystatin 100000 UNIT/GM powder Apply to affected area twice daily 1 bottle 2   . oxybutynin 5 MG tablet Take 1 tablet (5 mg) by mouth 2 times a day. Take up to 3 times a day 90 tablet 12   . triamcinolone 0.1 % cream Apply to affected area on leg(s) 2 times a day. 1 Tube 3     No current facility-administered medications for this visit.        Review of patient's allergies indicates:  No Known Allergies    OBJECTIVE:  General: Alert, no acute distress  General appearance: healthy, alert, no distress  Mood/affect: Pleasant female. Mood and affect appropriate.    ASSESSMENT/PLAN:    Diagnoses and all orders for this visit:    Pulmonary HTN ([place])  -     furosemide 20 MG tablet; Take 1 tablet (20 mg) by mouth daily.    Urge incontinence of urine  -     oxybutynin 5 MG tablet; Take 1 tablet (5 mg) by mouth 2 times a day. Take up to 3 times a day    Anxiety  -     FLUoxetine 20 MG capsule; Take 3 capsules (60 mg) by mouth daily.    High cholesterol  -     atorvastatin 20 MG tablet; Take 1 tablet (20 mg) by mouth daily.    Essential hypertension  -     amLODIPine 10 MG tablet; Take 1 tablet (10 mg) by mouth daily.    Moderate episode of recurrent major depressive disorder (HCC)  -     buPROPion SR 200 MG 12 hr tablet; Take 1 tablet (200 mg) by mouth 2 times a day.    Elevated glucose  -     metFORMIN 1000 MG tablet; Take 1 tablet (1,000 mg) by mouth 2 times a day.    Thyroid disorder screen  -     TSH with Reflexive Free T4    Follow-up 3 months

## 2019-02-23 ENCOUNTER — Ambulatory Visit: Payer: Medicare HMO | Attending: Naturopath

## 2019-02-23 DIAGNOSIS — Z1329 Encounter for screening for other suspected endocrine disorder: Secondary | ICD-10-CM | POA: Insufficient documentation

## 2019-02-23 DIAGNOSIS — F331 Major depressive disorder, recurrent, moderate: Secondary | ICD-10-CM | POA: Insufficient documentation

## 2019-02-23 DIAGNOSIS — F419 Anxiety disorder, unspecified: Secondary | ICD-10-CM | POA: Insufficient documentation

## 2019-02-23 LAB — TSH WITH REFLEXIVE FREE T4: TSH with Reflexive Free T4: 2.581 u[IU]/mL (ref 0.400–5.000)

## 2019-03-05 ENCOUNTER — Other Ambulatory Visit
Admit: 2019-03-05 | Discharge: 2019-03-05 | Disposition: A | Discharge status: Home / Self Care | Payer: Medicare HMO | Attending: Naturopath | Admitting: Naturopath

## 2019-03-05 DIAGNOSIS — Z1211 Encounter for screening for malignant neoplasm of colon: Secondary | ICD-10-CM | POA: Insufficient documentation

## 2019-03-06 LAB — OCCULT BLOOD BY IA, STL: Occult Bld 1 Result: NEGATIVE

## 2019-03-12 ENCOUNTER — Telehealth (HOSPITAL_BASED_OUTPATIENT_CLINIC_OR_DEPARTMENT_OTHER): Payer: Self-pay

## 2019-03-12 ENCOUNTER — Other Ambulatory Visit (HOSPITAL_BASED_OUTPATIENT_CLINIC_OR_DEPARTMENT_OTHER): Payer: Self-pay | Admitting: Sleep Medicine

## 2019-03-12 DIAGNOSIS — Z1159 Encounter for screening for other viral diseases: Secondary | ICD-10-CM

## 2019-03-12 DIAGNOSIS — Z01812 Encounter for preprocedural laboratory examination: Secondary | ICD-10-CM

## 2019-03-12 NOTE — Telephone Encounter (Signed)
The following message has been sent to the patient via eCare messaging. A text message has been sent via ZipWhip to the patient's mobile phone advising to check eCare. The patient has previously agreed to receive text messages from [place].     Hello Holly Blair,     My name is Holly Blair and I am the Registered Nurse at the [place] [facility] [place].    The plan is for COVID-19 testing to be conducted within 2 days prior to the scheduled [facility] Sleep Study on Tuesday, April 03, 2019.  You will need to have COVID-19 testing on Sunday, April 01, 2019.      We recommend that you have this testing done at a [facility] testing facility, many of which offer walk-up and drive-up testing and testing is conducted outdoors.  We recommend this because your results will be prioritized within the [facility] system and will automatically show up in your medical record when available.    I am only able to schedule COVID-19 testing at [facility].  If you would like to schedule testing at [facility], you may call me directly at [phone] between the hours of 8 AM and 4 PM, Monday through Friday.  The phone number listed above is my direct phone number which has a secure voice mail.      If you would like to explore other testing location options, you may call the dedicated COVID-19 scheduling number at [phone] to speak with a contact center representative who will be able to schedule you at [facility] or any other locations.  This number is also available after I get off work at [employer], so this number may work better if you are unable to contact me directly while I am in the clinic.    We recommend that you reduce your risk of potential exposure to COVID-19 by continuing to practice social/physical distancing, wash your hands, avoid touching your face, stay home if you are able to, wear a mask if you go out, and avoid large crowds.     Let's please get this coordinated as soon as possible to  be sure that we do not have to reschedule your sleep study, as I know that testing has ramped up and some testing sites now have limited availability.     Take care of yourself,     Holly Keen RN, BSN  [place] at [facility]

## 2019-03-26 NOTE — Telephone Encounter (Signed)
The patient has been scheduled for COVID-19 testing at [facility]), on Sunday, April 01, 2019 at 10:45 AM.  The patient has agreed to this date and time.  A message has been sent via eCare, to the patient, with instructions and a basic map showing the location.     Kandee Keen RN, BSN  [place] at [employer]

## 2019-04-01 ENCOUNTER — Ambulatory Visit (HOSPITAL_BASED_OUTPATIENT_CLINIC_OR_DEPARTMENT_OTHER): Payer: Medicare HMO | Attending: Sleep Medicine

## 2019-04-01 DIAGNOSIS — Z1159 Encounter for screening for other viral diseases: Secondary | ICD-10-CM | POA: Insufficient documentation

## 2019-04-01 DIAGNOSIS — Z01812 Encounter for preprocedural laboratory examination: Secondary | ICD-10-CM | POA: Insufficient documentation

## 2019-04-01 DIAGNOSIS — Z20828 Contact with and (suspected) exposure to other viral communicable diseases: Secondary | ICD-10-CM

## 2019-04-01 LAB — COVID-19 CORONAVIRUS QUALITATIVE PCR: COVID-19 Coronavirus Qual PCR Result: NOT DETECTED

## 2019-04-01 NOTE — Progress Notes (Signed)
Patient was seen on 04/01/2019 at the [facility] drive up site where a sample of dual nasal pharyngeal collection was taken. The specimen was sent to the [place] lab for COVID-19 testing.  Patient will be informed of test results within 48 hours.  Patient received informational instructions on self-care.    The specimen was collected by: CW

## 2019-04-02 ENCOUNTER — Telehealth (HOSPITAL_BASED_OUTPATIENT_CLINIC_OR_DEPARTMENT_OTHER): Payer: Self-pay

## 2019-04-02 NOTE — Telephone Encounter (Signed)
The following message has been sent to the patient via eCare messaging.  A text message has been sent via ZipWhip to the patient's mobile phone advising to check eCare. The patient has previously agreed to receive text messages from [place].    Hello Holly Blair,     My name is Izola Price and I am the Registered Nurse at the [place] [facility] [place].     Your COVID-19 test that was conducted on April 01, 2019 is negative/none detected.    This means that at the time you were tested for COVID-19 you had either not been exposed to the coronavirus or if you had been exposed, there was not enough of the virus built up in your body to be detected.  In the time between now and your sleep study scheduled for Tuesday, April 03, 2019, please reduce your risk of potential exposure to COVID-19 by continuing to practice social/physical distancing, stay home if you are able to, wear a mask if you go out, avoid large crowds, avoid touching your face, and wash your hands.    People with COVID-19 have had a wide range of symptoms reported - ranging from mild symptoms to severe illness. Symptoms may appear 2-14 days after exposure to the virus. People with the following symptoms may have COVID-19:    Fever or chills  Cough  Shortness of breath or difficulty breathing  Fatigue  Muscle or body aches  Headache  New loss of taste or smell  Sore throat  Congestion or runny nose  Nausea or vomiting  Diarrhea    This list from the CDC [facility] for Disease Control and Prevention) does not include all possible symptoms.  If you experience any of the above symptoms or are concerned about possible exposure or infection, please contact your primary care provider.      If you have any of these symptoms and/or a confirmed positive COVID-19 test within 2-3 weeks after having your sleep study with [place], please call me at the number below.     Please respond to this message or call me directly at [phone] between the hours of 8 AM and 4  PM, Monday through Friday, if you have any questions.  The phone number listed above is my direct phone number which has a secure voice mail.     Take care of yourself,      Kandee Keen RN, BSN  [place] at [facility]

## 2019-04-03 ENCOUNTER — Telehealth (HOSPITAL_BASED_OUTPATIENT_CLINIC_OR_DEPARTMENT_OTHER): Payer: Self-pay | Admitting: Sleep Medicine

## 2019-04-03 ENCOUNTER — Other Ambulatory Visit (HOSPITAL_BASED_OUTPATIENT_CLINIC_OR_DEPARTMENT_OTHER): Payer: Medicare HMO

## 2019-04-03 NOTE — Telephone Encounter (Signed)
RETURN CALL: Detailed message on voicemail only    SUBJECT:  Appointment Request     REASON FOR VISIT: Sleep Study    PREFERRED DATE/TIME: flexible    ADDITIONAL INFORMATION: The CCR followed the instructions from the clinic's service manual.

## 2019-04-04 ENCOUNTER — Encounter (HOSPITAL_BASED_OUTPATIENT_CLINIC_OR_DEPARTMENT_OTHER): Payer: Self-pay

## 2019-04-05 ENCOUNTER — Encounter (HOSPITAL_BASED_OUTPATIENT_CLINIC_OR_DEPARTMENT_OTHER): Payer: Self-pay

## 2019-04-08 ENCOUNTER — Other Ambulatory Visit: Payer: Self-pay

## 2019-04-09 NOTE — Telephone Encounter (Signed)
An in-lab sleep study has been scheduled.

## 2019-04-13 ENCOUNTER — Other Ambulatory Visit (HOSPITAL_BASED_OUTPATIENT_CLINIC_OR_DEPARTMENT_OTHER): Payer: Self-pay | Admitting: Sleep Medicine

## 2019-04-13 ENCOUNTER — Encounter (HOSPITAL_BASED_OUTPATIENT_CLINIC_OR_DEPARTMENT_OTHER): Payer: Self-pay

## 2019-04-13 DIAGNOSIS — Z20828 Contact with and (suspected) exposure to other viral communicable diseases: Secondary | ICD-10-CM

## 2019-04-13 DIAGNOSIS — Z01812 Encounter for preprocedural laboratory examination: Secondary | ICD-10-CM

## 2019-04-16 ENCOUNTER — Other Ambulatory Visit: Payer: Self-pay | Admitting: General Surgery

## 2019-04-24 ENCOUNTER — Other Ambulatory Visit (HOSPITAL_BASED_OUTPATIENT_CLINIC_OR_DEPARTMENT_OTHER): Payer: Medicare HMO

## 2019-05-01 ENCOUNTER — Other Ambulatory Visit (HOSPITAL_BASED_OUTPATIENT_CLINIC_OR_DEPARTMENT_OTHER)
Admit: 2019-05-01 | Discharge: 2019-05-01 | Disposition: A | Discharge status: Home / Self Care | Payer: Medicare HMO | Attending: Sleep Medicine | Admitting: Sleep Medicine

## 2019-05-01 ENCOUNTER — Ambulatory Visit: Payer: Medicare HMO | Attending: Internal Medicine

## 2019-05-01 DIAGNOSIS — Z20828 Contact with and (suspected) exposure to other viral communicable diseases: Secondary | ICD-10-CM | POA: Insufficient documentation

## 2019-05-01 DIAGNOSIS — Z01812 Encounter for preprocedural laboratory examination: Secondary | ICD-10-CM | POA: Insufficient documentation

## 2019-05-01 DIAGNOSIS — Z20822 Contact with and (suspected) exposure to covid-19: Secondary | ICD-10-CM

## 2019-05-01 LAB — COVID-19 CORONAVIRUS QUALITATIVE PCR: COVID-19 Coronavirus Qual PCR Result: NOT DETECTED

## 2019-05-02 ENCOUNTER — Ambulatory Visit (HOSPITAL_BASED_OUTPATIENT_CLINIC_OR_DEPARTMENT_OTHER): Payer: Medicare HMO | Attending: Sleep Medicine

## 2019-05-02 ENCOUNTER — Telehealth (HOSPITAL_BASED_OUTPATIENT_CLINIC_OR_DEPARTMENT_OTHER): Payer: Self-pay

## 2019-05-02 DIAGNOSIS — G4733 Obstructive sleep apnea (adult) (pediatric): Secondary | ICD-10-CM | POA: Insufficient documentation

## 2019-05-02 NOTE — Progress Notes (Signed)
Patient was seen on 05/01/2019 at the [place] [facility] COVID 19 TEST SITE drive up site where a sample of dual nasal pharyngeal collection was taken. The specimen was sent to the [place] lab for COVID-19 testing.  Patient will be informed of test results within 48 hours.  Patient received informational instructions on self-care.    The specimen was collected by: Jennavie Martinek, MA-R

## 2019-05-02 NOTE — Patient Instructions (Signed)
COVID-19 Antibody Testing  Also known as COVID-19 Serological Testing    The COVID-19 antibody test is a non-urgent laboratory test. To ensure your safety, we recommend discussing with your doctor the risks and benefits of having this test performed at a health care facility.    When is antibody testing performed?  This testing is done weeks AFTER you have potential or confirmed COVID-19 symptoms. Testing is done for:   Patients who were tested and diagnosed with COVID-19   Patients who had symptoms that could have been due to COVID-19 and were never tested    Patients who were exposed to COVID-19 more than 10 days ago      Is antibody testing used if I am currently experiencing symptoms?  No. This test is not used to diagnose acute infections.       What are the possible results I may receive from antibody testing?  You may receive a positive or negative result from this test.    Positive result:  If your test result comes back positive, it means you had a previous COVID-19 infection. Although we are not testing patients who are currently symptomatic, it could also indicate a current COVID-19 infection.     Negative result:  If your test result comes back negative, it means you likely have not had a COVID-19 infection. Depending on when your test was performed, it could also be too early to show antibodies. In certain cases, such as those patients who are immunocompromised, a negative test result may be due to a delayed antibody response or there may not be detectable levels of antibodies.       Does a positive antibody test result mean I am immune to COVID-19?  At this point, it is not known if a positive test result means you are immune to COVID-19. Even if it means you are immune, it is not known how long that immunity may last. A positive test does not mean you are safe to continue your daily activities without precautions.      Do I still need to practice social distancing and other precautions?  Yes,  even with a positive test result, you should still continue social distancing, practicing good hand hygiene, and wearing a mask when social distancing is not possible.      Is it possible that my blood test is wrong?  Yes, there is always a risk of a false positive or false negative test result. If you have questions, you can discuss this with your doctor.       What type of sample is needed?  A blood sample      How long does it take to get my results?  Usually within 1 day, although it may take longer depending on how many tests are run.      Will this test be covered by my insurance?  Each insurance plan may offer different coverage. If you have questions or concerns, please check with your individual insurance plan.      My test came back positive and I am interested in donating my plasma. Where can I find more information?  If you are interested in being a potential plasma donor, please visit the following website: [email or web address]

## 2019-05-02 NOTE — Telephone Encounter (Signed)
The following message has been sent to the patient via eCare messaging.  A text message has been sent via [employer] to the patient's mobile phone advising to check eCare. The patient has previously agreed to receive text messages from [place].    Hello Holly Blair,     My name is Izola Price and I am the Registered Nurse at the [facility] [place].     Your COVID-19 test that was conducted on May 02, 2019 is negative/none detected.    This means that at the time you were tested for COVID-19 you tested negative and no virus was detected.  In the time between now and your sleep study scheduled for , please reduce your risk of potential exposure to COVID-19 by continuing to practice social/physical distancing, stay home if you are able to, wear a mask if you go out, avoid large crowds, avoid touching your face, and wash your hands.    If you are currently experiencing any cold or flu-like symptoms and you have not yet been to our sleep lab and completed your sleep study, we encourage you to reschedule your appointment for a later date rather than exposing others to what may be an infection.  If you experience any cold or flu-like symptoms or are concerned about possible exposure or infection, please contact your primary care provider.    If you have any of these symptoms and/or a confirmed positive COVID-19 test within 2-3 weeks after having your sleep study with [place], please call us at the number below.     Please respond to this message or call the clinic RN directly at [phone] between the hours of 8 AM and 4 PM, Monday through Friday, if you have any questions.  The phone number listed above has a secure voice mail.     Take care of yourself,      Kandee Keen RN, BSN  [place] [facility]

## 2019-05-06 NOTE — Progress Notes (Signed)
MEDICINE [facility]  CARDIOLOGY CLINIC  OUTPATIENT FOLLOW-UP NOTE      Patient Name:  Holly Blair  Date of Birth: 10/23/1965  Medical Record Number: [id number]  Primary Care Physician:  Keehn, Elan Cary, ARNP  Referring Physician:  Keehn, Elan Cary, ARNP    Chief Complaint/Reason for Visit  Follow-Up     INTERVAL HISTORY  Holly Blair returns today for routine follow up, having last been seen in clinic approximately 3 months ago via telemedicine.  She presents to today's clinic visit with her fiance. In the interim, she has doing well at home. She has been walking the hallways without issues at home and has been adherent to her medications.     She has been on Lasix 20 mg once a day and feels like it has kept her euvolemic. Her weight has improved since July. Her BP is a little elevated in clinic today but not wildly elevated.     She denies chest pain, palpitations, LE edema, PND and orthopnea.     PROBLEM LIST  Patient Active Problem List    Diagnosis Date Noted   . Heart failure with preserved ejection fraction (HCC) [I50.30] 02/05/2019   . Pulmonary HTN (HCC) [I27.20] 09/07/2018   . Prediabetes [R73.03] 09/07/2018   . Rosacea [L71.9] 07/28/2018   . Essential hypertension [I10] 07/28/2018   . Anxiety [F41.9] 02/09/2017   . Systolic murmur [R01.1] 01/03/2015   . Morbid obesity with BMI of 50.0-59.9, adult (HCC) [E66.01, Z68.43] 10/17/2014   . Hyperlipidemia [E78.5] 10/17/2014   . Senile nuclear cataract [H25.10] 07/13/2011   . Myopia with astigmatism and presbyopia [H52.10, H52.209, H52.4] 07/13/2011   . Depression, major, recurrent (HCC) [F33.9]    . Chronic knee pain [M25.569, G89.29]    . Chronic back pain [M54.9, G89.29]      Past Surgical History:   Procedure Laterality Date   . LIG/TRNSXJ FLP TUBE ABDL/VAG APPR UNI/BI         CURRENT MEDICATIONS  Outpatient Medications Marked as Taking for the 05/07/19 encounter (Office Visit) with Savla, Jainy, MD   Medication Sig Dispense Refill   .  acetaminophen 325 MG tablet Take 2 tablets (650 mg) by mouth every 4 hours as needed (pain). 60 tablet 1   . amLODIPine 10 MG tablet Take 1 tablet (10 mg) by mouth daily. 90 tablet 3   . atorvastatin 20 MG tablet Take 1 tablet (20 mg) by mouth daily. 90 tablet 3   . buPROPion SR 150 MG 12 hr tablet Take 1 tablet (150 mg) by mouth 2 times a day. 180 tablet 1   . buPROPion SR 200 MG 12 hr tablet Take 1 tablet (200 mg) by mouth 2 times a day. 180 tablet 1   . clotrimazole 1 % cream Apply to affected area on abdomen 2 times a day. For fungal rash. 45 g 2   . FLUoxetine 20 MG capsule Take 3 capsules (60 mg) by mouth daily. 270 capsule 3   . furosemide 20 MG tablet Take 1 tablet (20 mg) by mouth daily. 90 tablet 1   . Ibuprofen 600 MG Oral Tab take 1 tablet by mouth every 6 hours with food AND PLENTY OF WATER if needed for pain 90 tablet 0   . Iron (Ferrous Sulfate) 142 (45 Fe) MG tablet extended-release Take 1 tablet by mouth daily.      . metFORMIN 1000 MG tablet Take 1 tablet (1,000 mg) by mouth 2 times a   day. 180 tablet 3   . metroNIDAZOLE 0.75 % topical gel Apply to affected area on face 2 times a day. 1 Tube 3   . mupirocin 2 % ointment Apply to open lesions on lower legs and ankles three times daily 1 Tube 0   . nystatin 100000 UNIT/GM cream Apply To the affected areas under breasts twice daily as directed 30 g 0   . nystatin 100000 UNIT/GM powder Apply to affected area twice daily 1 bottle 2   . oxybutynin 5 MG tablet Take 1 tablet (5 mg) by mouth 2 times a day. Take up to 3 times a day 90 tablet 12   . triamcinolone 0.1 % cream Apply to affected area on leg(s) 2 times a day. 1 Tube 3       ALLERGIES  Review of patient's allergies indicates:  No Known Allergies     I have reviewed the patient's recorded medical history including the past medical history, past surgical history, allergies, social history, family history and problem list and confirmed it with the patient.    PHYSICAL EXAM  BP (!) 144/88   Pulse 65    Temp 97.9 F (36.6 C) (Temporal)   SpO2 97%   GENERAL:  Well appearing woman, resting comfortably and in no acute distress.    HEENT:  Normocephalic, atraumatic cranium with moist mucous membranes and anicteric sclerae.    NECK:  no thyromegaly.  No carotid bruits.  LUNGS:  clear to auscultation bilaterally without wheezes, rales or rhonchi.   CARDIAC:   Regular rate and rhythm with a normal intensity S1 and S2.  There are no murmurs, rubs or gallops.  The JVP is estimated to be 8 cm  ABDOMEN:  soft, non-tender, non-distended with normal bowel sounds.   EXTREMITIES:  warm and well-perfused without cyanosis or clubbing. No bilateral lower extremity edema.  SKIN:  no erythema, rashes or breakdown.    NEUROLOGIC:  alert and oriented x 3 with symmetric motor, bulk and tone.    PSYCH:  appropriate mood and affect.     OBJECTIVE DATA  Laboratory Results:   Lab Results   Component Value Date/Time    WBC 10.98 (H) 12/27/2018 10:21 AM    WBC 9.56 11/21/2018 06:20 AM    WBC 10.50 (H) 11/20/2018 05:55 AM    HEMATOCRIT 32 (L) 12/27/2018 10:21 AM    HEMATOCRIT 26 (L) 11/21/2018 06:20 AM    HEMATOCRIT 28 (L) 11/20/2018 05:55 AM    PLATELET 367 12/27/2018 10:21 AM    PLATELET 325 11/21/2018 06:20 AM    PLATELET 372 11/20/2018 05:55 AM     Lab Results   Component Value Date/Time    SODIUM 137 12/27/2018 10:21 AM    SODIUM 138 11/27/2018 04:30 PM    SODIUM 140 11/21/2018 06:20 AM    POTASSIUM 4.2 12/27/2018 10:21 AM    POTASSIUM 3.8 11/27/2018 04:30 PM    POTASSIUM 3.5 (L) 11/21/2018 06:20 AM    BUN 12 12/27/2018 10:21 AM    BUN 11 11/21/2018 06:20 AM    BUN 10 11/20/2018 05:55 AM    CREATININE 0.88 12/27/2018 10:21 AM    CREATININE 0.85 11/21/2018 06:20 AM    CREATININE 0.81 11/20/2018 05:55 AM    GLUCOSE 109 12/27/2018 10:21 AM    GLUCOSE 112 11/21/2018 06:20 AM    GLUCOSE 126 (H) 11/20/2018 05:55 AM    AST 12 12/27/2018 10:21 AM    AST 14 11/19/2018 06:13 PM      AST 16 07/28/2018 02:48 PM    ALT 10 12/27/2018 10:21 AM    ALT  11 11/19/2018 06:13 PM    ALT 14 07/28/2018 02:48 PM    BILIRUBN 0.7 12/27/2018 10:21 AM    BILIRUBN 0.6 11/19/2018 06:13 PM    BILIRUBN 0.4 07/28/2018 02:48 PM    ALK 97 12/27/2018 10:21 AM    ALK 102 11/19/2018 06:13 PM    ALK 91 07/28/2018 02:48 PM    ALBUMIN 3.8 12/27/2018 10:21 AM    ALBUMIN 3.4 (L) 11/19/2018 06:13 PM    ALBUMIN 3.9 07/28/2018 02:48 PM    PROTEIN 7.9 12/27/2018 10:21 AM    PROTEIN 8.0 11/19/2018 06:13 PM    PROTEIN 7.6 07/28/2018 02:48 PM     Cholesterol (Total)   Date Value Ref Range Status   12/27/2018 165 <200 mg/dL Final   07/28/2018 160 <200 mg/dL Final   02/09/2017 151 <200 mg/dL Final     Triglyceride   Date Value Ref Range Status   12/27/2018 109 <150 mg/dL Final   02/09/2017 114 <150 mg/dL Final   11/07/2013 113 <150 mg/dL Final     Cholesterol (HDL)   Date Value Ref Range Status   12/27/2018 42 >39 mg/dL Final   07/28/2018 53 >39 mg/dL Final   02/09/2017 52 >39 mg/dL Final     Cholesterol (LDL)   Date Value Ref Range Status   12/27/2018 101 <130 mg/dL Final   02/09/2017 76 <130 mg/dL Final   11/07/2013 138 (H) <130 mg/dL Final     Non-HDL Cholesterol   Date Value Ref Range Status   12/27/2018 123 0 - 159 mg/dL Final   02/09/2017 99 0 - 159 mg/dL Final   11/07/2013 161 (H) 0 - 159 mg/dL Final     Cholesterol/HDL Ratio   Date Value Ref Range Status   12/27/2018 3.9  Final   07/28/2018 3.0  Final     Comment:     For complete information to help interpret the Cholesterol/HDL Ratio Group,   please use the National Cholesterol Education Program (NCEP) guideline based   reference range comments found here: http://tests.labmed.[employer].edu/LIPID     02/09/2017 2.9  Final     Lab Results   Component Value Date/Time    TROPONIN <0.03 11/19/2018 06:13 PM     Lab Results   Component Value Date/Time    BNAP 110 (H) 11/19/2018 06:13 PM     ECHO:   TTE (date: 11/20/18)  1. Normal LV size and systolic function  2. Evidence of diastolic compliance abnormality  3. Mild aortic stenosis  4. Mitral  annular calciafication with 4 mm gradient  E/e' is increased and indicative of increased filling pressures.    TTE (date: 08/17/18)  Normal LV size and systolic function (EF 65%). No regional wall motion abnormalities.  Normal RV size and function.   Trace to mild aortic insufficiency. Mild-moderate aortic stenosis. Otherwise normal valvular function.   Mild pulmonary hypertension. Estimated PASP 39-44mmHg based on an estimated right atrial pressure of 5-10mmHg.  No pericardial effusion.    Other Imaging:   CT Chest PE Protocol (date: 11/19/18)  No evidence of pulmonary embolism.  -Mildly enlarged bilateral axillary lymph nodes, indeterminate in etiology.   Consider further investigation if clinically indicated.  -Dilated main pulmonary artery and reversal of main pulmonary artery to aorta   ratio indicates presence of pulmonary hypertension.  -Combination of groundglass opacity, minimal septal thickening, and bilateral   pleural effusions represents presence   of pulmonary edema.    ASSESSMENT/PLAN/RECOMMENDATIONS  Holly Blair is a 53 year old female with history of OSA, obesity, IDA, pulmonary hypertension, and chronic venous insufficiency who presents for new patient evaluation of diastolic dysfunction on echocardiogram.    1. Chronic diastolic heart failure, HFpEF  Evidence of diastolic dysfunction with elevated filling pressures despite difficult studies. No overt evidence of infiltrative disorders. She has stable weight on diuretic therapy and has been able to ambulate without limitations at home. On physical exam, she is euvolemic/borderline volume overloaded.   --Continue furosemide 43m  --Discussed diet at length today, especially with the upcoming holidays  --Encouraged continued physical activity     2. Mild Aortic Stenosis  Difficult echo quality but likely at least mild aortic stenosis. Plan to repeat echo in 144yrnd likely space out if unchanged.  --Will plan to repeat echo in February and  reassess testing interval    3. HTN  Continue aggressive management of hypertension  --Continue amlodipine 10 mg once a day and furosemide 20 mg once a day  --Her BP is a little elevated in clinic today but will not make changes until the next visit if her BP is still elevated.    4. Hyperlipidemia:  LDL from July 2020 is 101  --Will keep on Atorvastatin 20 mg Qhs    I spent a total time of 25 minutes face-to-face with the patient, of which more than 50% was spent counseling and coordinating care as outlined in this note.      Return in about 4 months (around 09/04/2019).    Author:  JaLevy SjogrenMD[place]wardWANew [place]

## 2019-05-07 ENCOUNTER — Ambulatory Visit: Payer: Medicare HMO | Attending: Cardiology | Admitting: Cardiology

## 2019-05-07 VITALS — BP 144/88 | HR 65 | Temp 97.9°F

## 2019-05-07 DIAGNOSIS — E782 Mixed hyperlipidemia: Secondary | ICD-10-CM | POA: Insufficient documentation

## 2019-05-07 DIAGNOSIS — I350 Nonrheumatic aortic (valve) stenosis: Secondary | ICD-10-CM | POA: Insufficient documentation

## 2019-05-07 DIAGNOSIS — I10 Essential (primary) hypertension: Secondary | ICD-10-CM | POA: Insufficient documentation

## 2019-05-07 DIAGNOSIS — I5032 Chronic diastolic (congestive) heart failure: Secondary | ICD-10-CM | POA: Insufficient documentation

## 2019-05-07 DIAGNOSIS — I351 Nonrheumatic aortic (valve) insufficiency: Secondary | ICD-10-CM | POA: Insufficient documentation

## 2019-05-07 NOTE — Patient Instructions (Signed)
It was great to see you at the [facility] today.    During the appointment, we discussed your heart failure and high blood pressure.    We agreed to the following treatment plan:  1. No changes to your medications today  2. We will repeat your echocardiogram (ultrasound) of the heart in February    Please call the clinic if any issues arise after this visit.

## 2019-05-09 ENCOUNTER — Other Ambulatory Visit (HOSPITAL_BASED_OUTPATIENT_CLINIC_OR_DEPARTMENT_OTHER): Payer: Self-pay | Admitting: Sleep Medicine

## 2019-05-09 DIAGNOSIS — G4733 Obstructive sleep apnea (adult) (pediatric): Secondary | ICD-10-CM

## 2019-05-11 ENCOUNTER — Encounter (HOSPITAL_BASED_OUTPATIENT_CLINIC_OR_DEPARTMENT_OTHER): Payer: Self-pay

## 2019-05-11 ENCOUNTER — Encounter (HOSPITAL_BASED_OUTPATIENT_CLINIC_OR_DEPARTMENT_OTHER): Payer: Self-pay | Admitting: Sleep Medicine

## 2019-05-11 NOTE — Telephone Encounter (Signed)
DME:  Undetermined  Device:  APAP  Pressure:  5-20  Plan:  Follow up with Dr. Arlina Robes 5-7 weeks post set-up.    This patient, Holly Blair, has been contacted via eCare/MyChart and given their sleep study results and the recommendations from their Sleep Medicine provider.     The patient has been given the [facility] phone number of [phone] and encouraged to call with questions or concerns.    Wandra Arthurs, RN, 05/11/2019 11:41 AM  [name] [facility]

## 2019-05-25 NOTE — Progress Notes (Signed)
Faxed Rx to [facility] on 05/11/2019. Recall made

## 2019-05-28 ENCOUNTER — Encounter (HOSPITAL_BASED_OUTPATIENT_CLINIC_OR_DEPARTMENT_OTHER): Payer: Self-pay | Admitting: *Deleted

## 2019-05-28 ENCOUNTER — Other Ambulatory Visit: Payer: Self-pay

## 2019-05-31 ENCOUNTER — Other Ambulatory Visit (HOSPITAL_COMMUNITY): Payer: BC Managed Care – PPO

## 2019-06-01 ENCOUNTER — Other Ambulatory Visit (HOSPITAL_COMMUNITY): Payer: Managed Care, Other (non HMO)

## 2019-06-04 ENCOUNTER — Ambulatory Visit (HOSPITAL_BASED_OUTPATIENT_CLINIC_OR_DEPARTMENT_OTHER): Admission: RE | Admit: 2019-06-04 | Payer: BC Managed Care – PPO | Source: Home / Self Care | Admitting: General Surgery

## 2019-06-04 SURGERY — EXCISION MASS
Anesthesia: General | Laterality: Left

## 2019-06-11 ENCOUNTER — Telehealth (HOSPITAL_BASED_OUTPATIENT_CLINIC_OR_DEPARTMENT_OTHER): Payer: Self-pay | Admitting: Naturopath

## 2019-06-11 NOTE — Telephone Encounter (Signed)
Received from  From [place] for reasonable Accommodation  Form is in Elan's bin to complete.

## 2019-06-18 NOTE — Telephone Encounter (Signed)
Pt called to get an update on her housing paper work.

## 2019-06-18 NOTE — Telephone Encounter (Signed)
Pt had requested a one bedroom apartment due to her disability. The way the form is filled out currently, it is unclear how having a dedicated bedroom will help accommodate her needs of daily living.    Left a voicemail to call back, and gave the number [phone], option 2.
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
## 2019-06-20 ENCOUNTER — Encounter (HOSPITAL_BASED_OUTPATIENT_CLINIC_OR_DEPARTMENT_OTHER): Payer: Self-pay | Admitting: Naturopath

## 2019-06-20 ENCOUNTER — Encounter (HOSPITAL_BASED_OUTPATIENT_CLINIC_OR_DEPARTMENT_OTHER): Payer: Self-pay | Admitting: Sleep Medicine

## 2019-06-20 ENCOUNTER — Ambulatory Visit: Payer: Medicare HMO | Attending: Naturopath | Admitting: Naturopath

## 2019-06-20 DIAGNOSIS — I10 Essential (primary) hypertension: Secondary | ICD-10-CM | POA: Insufficient documentation

## 2019-06-20 NOTE — Progress Notes (Signed)
Phone Visit    This phone visit is being conducted in lieu of an in-person encounter in my clinic due to the current COVID-19 outbreak and the risk that an in-person visit could pose to the patient, clinic staff, or the general public.    Reason For Call   Patient presents with   . Telemedicine   . Forms Completion     ADA forms   . Follow Up Visit     Sleep study results        Issues Discussed:  She has been checking her BP: 115/66 and pulse was 76  She has Cardiology appointments coming up  She needs paperwork filled out to get a one bedroom to make more room for her medical equipment.   Sleep apnea:  She is going to trial a Cpap machine for 30 days     Assessment and Plan:  Holly Blair was seen today for telemedicine, forms completion and follow up visit.    Diagnoses and all orders for this visit:    Essential hypertension  Recent reading is good.  Continue with dietary changes, regular activity and medications     Paper work completed and sent in     Plan for next visit: 3 months    Time spent on medical discussion:  13 minutes    LOS: [id number]  11-20 minutes of medical discussion

## 2019-06-20 NOTE — Progress Notes (Signed)
Faxed disability accommodation paperwork to the [employer].

## 2019-07-18 ENCOUNTER — Encounter (HOSPITAL_BASED_OUTPATIENT_CLINIC_OR_DEPARTMENT_OTHER): Payer: Self-pay | Admitting: Naturopath

## 2019-07-18 ENCOUNTER — Encounter (HOSPITAL_BASED_OUTPATIENT_CLINIC_OR_DEPARTMENT_OTHER): Payer: Medicare HMO | Admitting: Naturopath

## 2019-07-18 DIAGNOSIS — Z1231 Encounter for screening mammogram for malignant neoplasm of breast: Secondary | ICD-10-CM

## 2019-07-19 ENCOUNTER — Ambulatory Visit: Payer: Medicare HMO | Attending: Naturopath | Admitting: Naturopath

## 2019-07-19 DIAGNOSIS — F331 Major depressive disorder, recurrent, moderate: Secondary | ICD-10-CM | POA: Insufficient documentation

## 2019-07-19 DIAGNOSIS — E7800 Pure hypercholesterolemia, unspecified: Secondary | ICD-10-CM | POA: Insufficient documentation

## 2019-07-19 MED ORDER — BUPROPION HCL ER (XL) 300 MG OR TB24
300.0000 mg | EXTENDED_RELEASE_TABLET | Freq: Every day | ORAL | 3 refills | Status: DC
Start: 2019-07-19 — End: 2020-08-18

## 2019-07-19 MED ORDER — BUPROPION HCL ER (XL) 150 MG OR TB24
150.0000 mg | EXTENDED_RELEASE_TABLET | Freq: Every day | ORAL | 3 refills | Status: DC
Start: 2019-07-19 — End: 2020-08-18

## 2019-07-19 MED ORDER — ATORVASTATIN CALCIUM 20 MG OR TABS
20.0000 mg | ORAL_TABLET | Freq: Every day | ORAL | 3 refills | Status: DC
Start: 2019-07-19 — End: 2020-04-21

## 2019-07-19 NOTE — Progress Notes (Signed)
Is this visit being conducted using Telemedicine with live, interactive video and audio? Telemedicine (live, interactive video & audio)     Distant Site Telemedicine Encounter    I conducted this encounter from [facility] via secure, live, face-to-face video conference with the patient. [name] was located at home. Prior to the interview, the risks and benefits of telemedicine were discussed with the patient and verbal consent was obtained.    Telemedicine Consent   This visit was conducted via Telehealth in lieu of an in-person encounter in my clinic due to the current COVID-19 outbreak and the risk that an in-person visit could pose to the patient, clinic staff, or the general public.  I confirmed the patient's name, date of birth, and callback number:YES  I introduced myself and showed my badge: YES  I explained that this visit will be billed the same as a face-to-face visit and patient consented to use telemedicine for their medical care today:YES  Patient reported all questions regarding telemedicine have been answered: YES    I received consent from the patient to have the visit by secure audio and video, including the statement: "I cannot provide the same evaluation as in a face-to-face visit. I may need you to come in for further evaluation or care. If at any time your would like to be seen in-person, we will terminate the visit and connect you to the most feasible in-person care. The technology is encrypted and secure; however, no technology is 100% hack-proof. The technology is dependent on a reliable internet connection."    SUBJECTIVE:   Holly Blair is a 54 year old female patient who presents with the following concern:     Chief Complaint: mental health     Her depressive symptoms have increased. She thinks it is related to Covid. She currently has a friend who is very sick and in the hospital. She continues to take Wellbutrin 150 mg twice daily and fluoxetine 60 mg daily.    She has also found her self  getting more anxious and increasingly irritable. She is interested in medication change or dosage increase.      She is walking daily for about 20 minutes.  She has been monitoring her BP daily and it tends to be about 135/60.     She has an upcoming appointment for a mammogram and an echocardiogram scheduled.     Review of Systems   Cardiovascular: Negative for chest pain.   Respiratory: Negative for cough.    Psychiatric/Behavioral: Positive for depression. Negative for suicidal ideas. The patient is nervous/anxious. The patient does not have insomnia.          Patient Active Problem List   Diagnosis   . Depression, major, recurrent ([place])   . Chronic knee pain   . Chronic back pain   . Senile nuclear cataract   . Myopia with astigmatism and presbyopia   . Morbid obesity with BMI of 50.0-59.9, adult ([place])   . Hyperlipidemia   . Systolic murmur   . Anxiety   . Rosacea   . Essential hypertension   . Pulmonary HTN ([place])   . Prediabetes   . Heart failure with preserved ejection fraction (HCC)       Current Outpatient Medications   Medication Sig Dispense Refill   . acetaminophen 325 MG tablet Take 2 tablets (650 mg) by mouth every 4 hours as needed (pain). 60 tablet 1   . amLODIPine 10 MG tablet Take 1 tablet (10 mg) by mouth  daily. 90 tablet 3   . atorvastatin 20 MG tablet Take 1 tablet (20 mg) by mouth daily. 90 tablet 3   . buPROPion SR 150 MG 12 hr tablet Take 1 tablet (150 mg) by mouth 2 times a day. 180 tablet 1   . buPROPion SR 200 MG 12 hr tablet Take 1 tablet (200 mg) by mouth 2 times a day. 180 tablet 1   . clotrimazole 1 % cream Apply to affected area on abdomen 2 times a day. For fungal rash. 45 g 2   . FLUoxetine 20 MG capsule Take 3 capsules (60 mg) by mouth daily. 270 capsule 3   . furosemide 20 MG tablet Take 1 tablet (20 mg) by mouth daily. 90 tablet 1   . Ibuprofen 600 MG Oral Tab take 1 tablet by mouth every 6 hours with food AND PLENTY OF WATER if needed for pain 90 tablet 0   . Iron (Ferrous  Sulfate) 142 (45 Fe) MG tablet extended-release Take 1 tablet by mouth daily.      . metFORMIN 1000 MG tablet Take 1 tablet (1,000 mg) by mouth 2 times a day. 180 tablet 3   . metroNIDAZOLE 0.75 % topical gel Apply to affected area on face 2 times a day. 1 Tube 3   . mupirocin 2 % ointment Apply to open lesions on lower legs and ankles three times daily 1 Tube 0   . nystatin 100000 UNIT/GM cream Apply To the affected areas under breasts twice daily as directed 30 g 0   . nystatin 100000 UNIT/GM powder Apply to affected area twice daily 1 bottle 2   . oxybutynin 5 MG tablet Take 1 tablet (5 mg) by mouth 2 times a day. Take up to 3 times a day 90 tablet 12   . triamcinolone 0.1 % cream Apply to affected area on leg(s) 2 times a day. 1 Tube 3     No current facility-administered medications for this visit.        Review of patient's allergies indicates:  No Known Allergies    OBJECTIVE:  General: Alert, no acute distress   LMP 03/31/2017 (Within Days)   General appearance: healthy, alert, no distress  Mood/affect: Pleasant female. Mood and affect appropriate.  PHQ9 Total Score: 9  GAD7 Score, Total: 10    ASSESSMENT/PLAN:    Diagnoses and all orders for this visit:    Moderate episode of recurrent major depressive disorder (HCC)  -     buPROPion XL 300 MG 24 hr tablet; Take 1 tablet (300 mg) by mouth daily.  -     buPROPion XL 150 MG 24 hr tablet; Take 1 tablet (150 mg) by mouth daily. Take witht he 300 mg dose.  -     Continue with Fluoxetine 60 mg daily    High cholesterol  -     atorvastatin 20 MG tablet; Take 1 tablet (20 mg) by mouth daily.

## 2019-07-20 NOTE — Patient Instructions (Signed)
Continue with Fluoxetine 60 mg daily  Increase Wellbutrin to 450 mg.  It will now be a once daily dose    Follow-up in about 3-4 weeks.  If anxiety is still elevated I will add on Buspirone

## 2019-07-24 ENCOUNTER — Encounter (HOSPITAL_BASED_OUTPATIENT_CLINIC_OR_DEPARTMENT_OTHER): Payer: Self-pay

## 2019-07-24 ENCOUNTER — Other Ambulatory Visit: Payer: Self-pay

## 2019-07-24 ENCOUNTER — Ambulatory Visit: Payer: Medicare HMO | Attending: Cardiovascular Disease

## 2019-07-24 ENCOUNTER — Other Ambulatory Visit (HOSPITAL_COMMUNITY): Payer: Self-pay

## 2019-07-24 DIAGNOSIS — I350 Nonrheumatic aortic (valve) stenosis: Secondary | ICD-10-CM | POA: Insufficient documentation

## 2019-07-24 NOTE — Progress Notes (Signed)
Placed a 22 gauge Peripheral IV in the LAC to give Definity during the TTE.   Gave 54ml of reconstituted Definity in intermittent doses during the study.  Peripheral IV discontinued at the end of the study and covered with a clean, dry, intact gauze dressing.   Cath intact upon removal.

## 2019-08-02 ENCOUNTER — Encounter (HOSPITAL_BASED_OUTPATIENT_CLINIC_OR_DEPARTMENT_OTHER): Payer: Self-pay

## 2019-08-07 ENCOUNTER — Other Ambulatory Visit: Payer: Self-pay

## 2019-08-07 ENCOUNTER — Ambulatory Visit (HOSPITAL_BASED_OUTPATIENT_CLINIC_OR_DEPARTMENT_OTHER): Payer: Medicare HMO | Attending: Sleep Medicine | Admitting: Sleep Medicine

## 2019-08-07 DIAGNOSIS — G4733 Obstructive sleep apnea (adult) (pediatric): Secondary | ICD-10-CM | POA: Insufficient documentation

## 2019-08-07 NOTE — Patient Instructions (Signed)
Nice to see you today    Keep working on using CPAP daily    Ok to use boiled (and cooled) tap water for machine if distilled water not available    Follow up in 6 weeks
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
## 2019-08-09 ENCOUNTER — Ambulatory Visit: Payer: Medicare HMO | Attending: Naturopath | Admitting: Naturopath

## 2019-08-09 DIAGNOSIS — F32A Depression, unspecified: Secondary | ICD-10-CM

## 2019-08-09 DIAGNOSIS — F329 Major depressive disorder, single episode, unspecified: Secondary | ICD-10-CM | POA: Insufficient documentation

## 2019-08-09 DIAGNOSIS — F419 Anxiety disorder, unspecified: Secondary | ICD-10-CM | POA: Insufficient documentation
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
## 2019-08-15 ENCOUNTER — Ambulatory Visit (HOSPITAL_BASED_OUTPATIENT_CLINIC_OR_DEPARTMENT_OTHER): Payer: Medicare HMO

## 2019-08-27 ENCOUNTER — Other Ambulatory Visit: Payer: Self-pay

## 2019-08-27 ENCOUNTER — Ambulatory Visit: Payer: Medicare HMO | Attending: Cardiology | Admitting: Cardiology

## 2019-08-27 ENCOUNTER — Encounter (HOSPITAL_BASED_OUTPATIENT_CLINIC_OR_DEPARTMENT_OTHER): Payer: Self-pay | Admitting: Cardiology

## 2019-08-27 VITALS — BP 134/70 | HR 71 | Temp 97.2°F | Ht 64.0 in | Wt 340.0 lb

## 2019-08-27 DIAGNOSIS — I10 Essential (primary) hypertension: Secondary | ICD-10-CM | POA: Insufficient documentation

## 2019-08-27 DIAGNOSIS — E782 Mixed hyperlipidemia: Secondary | ICD-10-CM | POA: Insufficient documentation

## 2019-08-27 DIAGNOSIS — I350 Nonrheumatic aortic (valve) stenosis: Secondary | ICD-10-CM | POA: Insufficient documentation

## 2019-08-27 DIAGNOSIS — I5032 Chronic diastolic (congestive) heart failure: Secondary | ICD-10-CM | POA: Insufficient documentation

## 2019-08-27 NOTE — Progress Notes (Signed)
[facility]  OUTPATIENT FOLLOW-UP NOTE      Patient Name:  Holly Blair  Date of Birth: 1965-09-06  Medical Record Number: [id number]  Primary Care Physician:  Eldred Manges, ARNP  Referring Physician:  Eldred Manges, ARNP    Chief Complaint/Reason for Visit  Cardiac Valve Problem (5 MO FOLLOW UP)      INTERVAL HISTORY  Ms. Lutter returns today for routine follow up, having last been seen in clinic approximately 4 months ago.  In the interim, she has doing well at home. She is back to exercising and has lost some weight. She is doing pool aerobics and feels that her exercise capacity is increasing.     She has some edema of her left lower extremity but otherwise feels that her furosemide is working well. She denies chest pain, palpitations, shortness of breath, PND and orthopnea.     PAST MEDICAL HISTORY/PAST SURGICAL HISTORY/PROBLEM LIST/SOCIAL HISTORY/FAMILY HISTORY:  I have reviewed the patient's recorded medical history including the past medical history, past surgical history, problem list, social history and family history and confirmed it with the patient. The following are not included in the HPI and are relevant to this encounter:    1. OSA  2. Obesity  3. IDA  4. Pulmonary hypertension  5. Chronic venous insufficiency  6. HFpEF  7. Mild aortic stenosis    CURRENT MEDICATIONS  Outpatient Medications Marked as Taking for the 08/27/19 encounter (Office Visit) with Levy Sjogren, MD   Medication Sig Dispense Refill   . acetaminophen 325 MG tablet Take 2 tablets (650 mg) by mouth every 4 hours as needed (pain). 60 tablet 1   . amLODIPine 10 MG tablet Take 1 tablet (10 mg) by mouth daily. 90 tablet 3   . atorvastatin 20 MG tablet Take 1 tablet (20 mg) by mouth daily. 90 tablet 3   . buPROPion XL 150 MG 24 hr tablet Take 1 tablet (150 mg) by mouth daily. Take witht he 300 mg dose. 90 tablet 3   . buPROPion XL 300 MG 24 hr tablet Take 1 tablet (300 mg) by mouth  daily. 90 tablet 3   . clotrimazole 1 % cream Apply to affected area on abdomen 2 times a day. For fungal rash. 45 g 2   . FLUoxetine 20 MG capsule Take 3 capsules (60 mg) by mouth daily. 270 capsule 3   . furosemide 20 MG tablet Take 1 tablet (20 mg) by mouth daily. 90 tablet 1   . Iron (Ferrous Sulfate) 142 (45 Fe) MG tablet extended-release Take 1 tablet by mouth daily.      . metFORMIN 1000 MG tablet Take 1 tablet (1,000 mg) by mouth 2 times a day. 180 tablet 3   . metroNIDAZOLE 0.75 % topical gel Apply to affected area on face 2 times a day. 1 Tube 3   . mupirocin 2 % ointment Apply to open lesions on lower legs and ankles three times daily 1 Tube 0   . nystatin 100000 UNIT/GM cream Apply To the affected areas under breasts twice daily as directed 30 g 0   . nystatin 100000 UNIT/GM powder Apply to affected area twice daily 1 bottle 2   . oxybutynin 5 MG tablet Take 1 tablet (5 mg) by mouth 2 times a day. Take up to 3 times a day 90 tablet 12   . triamcinolone 0.1 % cream Apply to affected area on leg(s) 2 times  a day. 1 Tube 3       ALLERGIES  Review of patient's allergies indicates:  No Known Allergies     PHYSICAL EXAM  BP 134/70   Pulse 71   Temp 97.2 F (36.2 C) (Temporal)   Ht '5\' 4"'  (1.626 m)   Wt (!) 340 lb (154.2 kg)   LMP 03/31/2017 (Within Days)   SpO2 96%   BMI 58.36 kg/m   GENERAL:  Well appearing, resting comfortably and in no acute distress.    HEENT:  Moist mucous membranes and anicteric sclerae.  Marland Kitchen  LUNGS:  clear to auscultation bilaterally without wheezes, rales or rhonchi.   CARDIAC:   Regular rate and rhythm with a normal intensity S1 and S2.  There are no murmurs, rubs or gallops.  The JVP is estimated to be 8 cm  ABDOMEN:  soft, non-tender, non-distended.   EXTREMITIES:  warm and well-perfused without cyanosis. 1+ LE edema of the LLE  SKIN:  no erythema, rashes or breakdown.    NEUROLOGIC:  alert and oriented x 3.    PSYCH:  appropriate mood and affect.     OBJECTIVE  DATA  Laboratory Results:   Lab Results   Component Value Date/Time    WBC 10.98 (H) 12/27/2018 10:21 AM    HEMATOCRIT 32 (L) 12/27/2018 10:21 AM    HEMOGLOBIN 9.3 (L) 12/27/2018 10:21 AM    PLATELET 367 12/27/2018 10:21 AM     Lab Results   Component Value Date/Time    SODIUM 137 12/27/2018 10:21 AM    POTASSIUM 4.2 12/27/2018 10:21 AM    BUN 12 12/27/2018 10:21 AM    CREATININE 0.88 12/27/2018 10:21 AM    GLUCOSE 109 12/27/2018 10:21 AM    AST 12 12/27/2018 10:21 AM    ALT 10 12/27/2018 10:21 AM    BILIRUBN 0.7 12/27/2018 10:21 AM    ALK 97 12/27/2018 10:21 AM    ALBUMIN 3.8 12/27/2018 10:21 AM    PROTEIN 7.9 12/27/2018 10:21 AM     Cholesterol (Total)   Date Value Ref Range Status   12/27/2018 165 <200 mg/dL Final     Triglyceride   Date Value Ref Range Status   12/27/2018 109 <150 mg/dL Final     Cholesterol (HDL)   Date Value Ref Range Status   12/27/2018 42 >39 mg/dL Final     Cholesterol (LDL)   Date Value Ref Range Status   12/27/2018 101 <130 mg/dL Final     Non-HDL Cholesterol   Date Value Ref Range Status   12/27/2018 123 0 - 159 mg/dL Final     Cholesterol/HDL Ratio   Date Value Ref Range Status   12/27/2018 3.9  Final     Lab Results   Component Value Date/Time    BNAP 110 (H) 11/19/2018 06:13 PM     ECHO:  TTE (date: 11/20/18)  1. Normal LV size and systolic function  2. Evidence of diastolic compliance abnormality  3. Mild aortic stenosis  4. 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    TTE (date: 08/17/18)  Normal LV size and systolic function (EF 69%). No regional wall motion abnormalities.  Normal RV size and function.   Trace to mild aortic insufficiency. Mild-moderate aortic stenosis. Otherwise normal valvular function.   Mild pulmonary hypertension. Estimated PASP 39-19mHg based on an estimated right atrial pressure of 5-174mg.  No pericardial effusion.    TTE (date: 07/24/2019):  1. Calcific aortic valve disease with mild  aortic stenosis peak 2.9 m/s, mean 18 mmHg, dimensionless index of . There is associated mild regurgitation. Guidelines suggest repeat echo in 3-5 years.   2. Normal left ventricular chamber size with hyperdynamic systolic function (EF 28%). No regional wall motion abnormalities seen. Impaired LV diastolic relaxation with likely normal filling pressures.  3. here is mild mitral annular calcification. Otherwise normal valvular anatomy and function.   4. Right ventricle not well visualized but probably normal size and function.   5. Mild pulmonary hypertension (PASP 36-41 mmHg) with mild central venous pressure (5-10 mmHg).  6. No pericardial effusion.  Compared to prior study, no major changes.     Other Imaging:  CT Chest PE Protocol (date: 11/19/18)  No evidence of pulmonary embolism.  -Mildly enlarged bilateral axillary lymph nodes, indeterminate in etiology.   Consider further investigation if clinically indicated.  -Dilated main pulmonary artery and reversal of main pulmonary artery to aorta   ratio indicates presence of pulmonary hypertension.  -Combination of groundglass opacity, minimal septal thickening, and bilateral   pleural effusions represents presence of pulmonary edema.    ASSESSMENT/PLAN/RECOMMENDATIONS  Chassity Ludke is a 54 year old female with history of OSA, obesity, IDA, pulmonary hypertension, and chronic venous insufficiency who presents for new patient evaluation of diastolic dysfunction on echocardiogram.    1.Chronic diastolic heart failure, HFpEF  Evidence of diastolic dysfunction with elevated filling pressures despite difficult studies. No overt evidence of infiltrative disorders. She has stable weight on diuretic therapy and has been able to ambulate without limitations at home. On physical exam, she is euvolemic/borderline volume overloaded.   --Continue furosemide 49m but will have her take furosemide 40 mg x 1-2 days due to her left LE edema  --Encouraged continued physical  activity. She has started doing water exercises with weight loss     2.Mild Aortic Stenosis  Difficult echo quality but likely at least mild aortic stenosis x 2 now  --Will plan to repeat echo in 3-5 years (2024 - 2026)    3.HTN  Continue aggressive management of hypertension  --Continue amlodipine 10 mg once a day and furosemide 20 mg once a day  --Her BP is well controlled in clinic today    4. Hyperlipidemia:  LDL from July 2020 is 101  --Will keep on Atorvastatin 20 mg Qhs    Return in about 6 months (around 02/27/2020).    I spent a total of 30 minutes for the patient's care on the date of the service.    Author:  JLevy Sjogren [place]

## 2019-08-27 NOTE — Patient Instructions (Signed)
It was great to see you at [facility] today.    During the appointment, we discussed your echo results and your swelling.    We agreed to the following treatment plan:  1. You'll try taking furosemide 40 mg for 1-2 days to see if that improves the swelling on your left leg.  2. You next echo will be in 3-5 years so 2024 at the earliest.    Please call the clinic if any issues arise after this visit.

## 2019-08-28 ENCOUNTER — Encounter (HOSPITAL_BASED_OUTPATIENT_CLINIC_OR_DEPARTMENT_OTHER): Payer: Self-pay | Admitting: Cardiology

## 2019-08-28 ENCOUNTER — Telehealth (HOSPITAL_BASED_OUTPATIENT_CLINIC_OR_DEPARTMENT_OTHER): Payer: Self-pay | Admitting: Cardiology

## 2019-08-28 NOTE — Telephone Encounter (Signed)
I have reached out to patient trying to assist in scheduling the next 6 month F/U with Dr. Ellen Henri Fellow 1 due September 2021.  Letter sent to patient.

## 2019-08-30 ENCOUNTER — Other Ambulatory Visit: Payer: Self-pay

## 2019-08-30 ENCOUNTER — Telehealth (HOSPITAL_BASED_OUTPATIENT_CLINIC_OR_DEPARTMENT_OTHER): Payer: Medicare HMO | Admitting: Naturopath

## 2019-09-04 ENCOUNTER — Telehealth (HOSPITAL_BASED_OUTPATIENT_CLINIC_OR_DEPARTMENT_OTHER): Payer: Self-pay | Admitting: Naturopath

## 2019-09-04 ENCOUNTER — Ambulatory Visit: Payer: Medicare HMO | Admitting: Naturopath

## 2019-09-04 NOTE — Progress Notes (Signed)
This encounter was opened in error.

## 2019-09-04 NOTE — Telephone Encounter (Signed)
LM reminder for appointment

## 2019-09-06 ENCOUNTER — Ambulatory Visit (HOSPITAL_BASED_OUTPATIENT_CLINIC_OR_DEPARTMENT_OTHER): Payer: Medicare HMO

## 2019-09-13 ENCOUNTER — Ambulatory Visit: Payer: Medicare HMO | Admitting: Naturopath

## 2019-09-13 NOTE — Progress Notes (Signed)
This encounter was opened in error  Patient no show for telemedicine visit

## 2019-09-17 ENCOUNTER — Other Ambulatory Visit (HOSPITAL_BASED_OUTPATIENT_CLINIC_OR_DEPARTMENT_OTHER): Payer: Self-pay | Admitting: Cardiology

## 2019-09-19 ENCOUNTER — Encounter (HOSPITAL_BASED_OUTPATIENT_CLINIC_OR_DEPARTMENT_OTHER): Payer: Medicare HMO | Admitting: Naturopath

## 2019-09-30 ENCOUNTER — Other Ambulatory Visit (HOSPITAL_BASED_OUTPATIENT_CLINIC_OR_DEPARTMENT_OTHER): Payer: Self-pay | Admitting: Naturopath

## 2019-09-30 DIAGNOSIS — I272 Pulmonary hypertension, unspecified: Secondary | ICD-10-CM

## 2019-10-01 MED ORDER — FUROSEMIDE 20 MG OR TABS
ORAL_TABLET | ORAL | 0 refills | Status: DC
Start: 2019-10-01 — End: 2020-04-21

## 2019-10-01 NOTE — Telephone Encounter (Signed)
Patient last seen on 08/09/2019 for anxiety/depresssion and was to return in 3 weeks.  One refill authorized.  Please schedule follow up visit.

## 2019-10-02 ENCOUNTER — Telehealth (HOSPITAL_BASED_OUTPATIENT_CLINIC_OR_DEPARTMENT_OTHER): Payer: Self-pay | Admitting: Sleep Medicine

## 2019-10-02 NOTE — Telephone Encounter (Signed)
LM for patient to call clinic to schedule f/u with pcp

## 2019-10-02 NOTE — Telephone Encounter (Signed)
Recall Letter mailed to Ada, [address], [place] [address], returned to the clinic 09/19/2019.    Postmarked: 08/27/2019       Labeled 09/14/2019: "Return to sender / Unclaimed / Unable to forward"             10/02/19 @ 13:41-42 - FIRST ATTEMPT  This PSS called [phone] (mobile) and LVM to call back at [phone], option 1, to update their mailing address and to schedule their annual follow-up appointment (from Recall Tab).    Sending eCare message to patient.    CCR: when patient calls, please update mailing address and schedule from Recall Tab.  Thank you!

## 2019-10-04 ENCOUNTER — Encounter (HOSPITAL_BASED_OUTPATIENT_CLINIC_OR_DEPARTMENT_OTHER): Payer: Medicare HMO | Admitting: Naturopath

## 2019-10-19 ENCOUNTER — Other Ambulatory Visit (HOSPITAL_BASED_OUTPATIENT_CLINIC_OR_DEPARTMENT_OTHER): Payer: Self-pay | Admitting: Naturopath

## 2019-10-19 DIAGNOSIS — Z1231 Encounter for screening mammogram for malignant neoplasm of breast: Secondary | ICD-10-CM
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
## 2019-10-23 ENCOUNTER — Ambulatory Visit: Payer: Medicare HMO | Attending: Sleep Medicine | Admitting: Sleep Medicine

## 2019-10-23 DIAGNOSIS — G4733 Obstructive sleep apnea (adult) (pediatric): Secondary | ICD-10-CM | POA: Insufficient documentation

## 2019-10-23 NOTE — Patient Instructions (Signed)
Nice to speak with you    Try using CPAP during day for a few minutes several times a day    Once you are used to using it again, try to use it at bedtime    Follow up in 1 month

## 2019-10-25 ENCOUNTER — Encounter (HOSPITAL_BASED_OUTPATIENT_CLINIC_OR_DEPARTMENT_OTHER): Payer: Self-pay | Admitting: Naturopath

## 2019-10-25 NOTE — Telephone Encounter (Signed)
I sent a message to patient to send [place] her Covid 19 vaccination card and let us know when she got and what manufacturer.

## 2019-11-26 ENCOUNTER — Ambulatory Visit (HOSPITAL_BASED_OUTPATIENT_CLINIC_OR_DEPARTMENT_OTHER): Payer: Medicare HMO

## 2019-12-04 ENCOUNTER — Ambulatory Visit (HOSPITAL_BASED_OUTPATIENT_CLINIC_OR_DEPARTMENT_OTHER): Payer: Medicare HMO

## 2019-12-29 ENCOUNTER — Other Ambulatory Visit (HOSPITAL_BASED_OUTPATIENT_CLINIC_OR_DEPARTMENT_OTHER): Payer: Self-pay | Admitting: Naturopath

## 2019-12-29 DIAGNOSIS — B372 Candidiasis of skin and nail: Secondary | ICD-10-CM

## 2020-01-01 MED ORDER — NYSTATIN 100000 UNIT/GM EX CREA
TOPICAL_CREAM | CUTANEOUS | 0 refills | Status: DC
Start: 2020-01-01 — End: 2020-12-23

## 2020-01-01 MED ORDER — NYSTATIN 100000 UNIT/GM EX POWD
CUTANEOUS | 2 refills | Status: DC
Start: 2020-01-01 — End: 2020-12-23

## 2020-02-08 ENCOUNTER — Other Ambulatory Visit (HOSPITAL_BASED_OUTPATIENT_CLINIC_OR_DEPARTMENT_OTHER): Payer: Self-pay | Admitting: Naturopath

## 2020-02-08 DIAGNOSIS — I10 Essential (primary) hypertension: Secondary | ICD-10-CM

## 2020-02-09 ENCOUNTER — Other Ambulatory Visit: Payer: Self-pay

## 2020-02-09 ENCOUNTER — Ambulatory Visit (HOSPITAL_COMMUNITY)
Admission: EM | Admit: 2020-02-09 | Discharge: 2020-02-09 | Disposition: A | Discharge status: Home / Self Care | Payer: BC Managed Care – PPO

## 2020-02-09 ENCOUNTER — Encounter (HOSPITAL_COMMUNITY): Payer: Self-pay

## 2020-02-09 DIAGNOSIS — S51852A Open bite of left forearm, initial encounter: Secondary | ICD-10-CM

## 2020-02-09 DIAGNOSIS — W540XXA Bitten by dog, initial encounter: Secondary | ICD-10-CM | POA: Diagnosis not present

## 2020-02-09 MED ORDER — AMOXICILLIN-POT CLAVULANATE 875-125 MG PO TABS: 1.0000 | ORAL_TABLET | Freq: Two times a day (BID) | ORAL | 0 refills | Status: DC

## 2020-02-09 MED ORDER — AMOXICILLIN-POT CLAVULANATE 875-125 MG PO TABS: 1.0000 | ORAL_TABLET | Freq: Two times a day (BID) | ORAL | 0 refills | Status: AC

## 2020-02-09 NOTE — ED Triage Notes (Signed)
Pt c/o wound to left forearm on Sunday when she was bitten by a dog. Pt reports dog was up to date on rabies. Pt concerned wound may be infected 2/2 increased/new redness and yellow drainage. Wound approx 4cm semicircle wound noted with yellow center and erythema surrounding. Pt has been applying wound cleansing solution and abx ointment.  Denies fever, chill, n/v/d.

## 2020-02-09 NOTE — Discharge Instructions (Signed)
Augmentin twice daily for 1 week Use anti-inflammatories for pain/swelling. You may take up to 800 mg Ibuprofen every 8 hours with food. You may supplement Ibuprofen with Tylenol [phone] mg every 8 hours.  Monitor for gradual healing

## 2020-02-10 NOTE — ED Provider Notes (Signed)
[facility]    CSN: [id number] Arrival date & time: 02/09/20  1157      History   Chief Complaint Chief Complaint  Patient presents with  . Animal Bite    HPI Charlene Johnson is a 54 y.o. female presenting today for evaluation of dog bite.  Patient was bitten by a foster dog on Sunday.  Dog is up-to-date on rabies and vaccines.  Patient also reports her tetanus is up-to-date.  She is concerned as she has noticed that the wound has been slightly more red and more painful as well as noticed one area becoming slightly larger and more separated.  She has been trying to cleanse the wound and has been applying Neosporin and Vaseline.  She denies any fevers.  Denies difficulty moving wrist or elbow.  HPI  Past Medical History:  Diagnosis Date  . Hyperlipemia   . S/P laparoscopic assisted vaginal hysterectomy (LAVH) 09/29/2017  . Status post bilateral salpingectomy 09/29/2017    Patient Active Problem List   Diagnosis Date Noted  . S/P laparoscopic assisted vaginal hysterectomy (LAVH) 09/29/2017  . Status post bilateral salpingectomy 09/29/2017  . Palpitations 07/06/2017  . Hypertriglyceridemia 07/06/2017  . Chest pain 07/06/2017  . Dyspnea on exertion 07/06/2017  . Family history of heart disease 07/06/2017    Past Surgical History:  Procedure Laterality Date  . EYE SURGERY  05/1999   LASIX  . LAPAROSCOPIC VAGINAL HYSTERECTOMY WITH SALPINGECTOMY Bilateral 09/29/2017   Procedure: LAPAROSCOPIC ASSISTED VAGINAL HYSTERECTOMY WITH SALPINGECTOMY;  Surgeon: Janyth Contes, MD;  Location: [facility];  Service: Gynecology;  Laterality: Bilateral;  . TONSILLECTOMY      OB History   No obstetric history on file.      Home Medications    Prior to Admission medications   Medication Sig Start Date End Date Taking? Authorizing Provider  Liraglutide -Weight Management (SAXENDA) 18 MG/3ML SOPN Inject into the skin.   Yes [provider]  amoxicillin-clavulanate  (AUGMENTIN) 875-125 MG tablet Take 1 tablet by mouth every 12 (twelve) hours for 7 days. 02/09/20 02/16/20  Berdell Nevitt, Elesa Hacker, PA-C    Family History Family History  Problem Relation Age of Onset  . Heart disease Father   . Hyperlipidemia Father   . Cancer Maternal Grandmother   . Cancer Maternal Grandfather   . Stroke Paternal Grandmother   . Cancer Paternal Grandfather     Social History Social History   Tobacco Use  . Smoking status: Never Smoker  . Smokeless tobacco: Never Used  Vaping Use  . Vaping Use: Never used  Substance Use Topics  . Alcohol use: Yes    Alcohol/week: 0.0 standard drinks    Comment: ONCE A MONTH  . Drug use: No     Allergies   Patient has no known allergies.   Review of Systems Review of Systems  Constitutional: Negative for fatigue and fever.  HENT: Negative for mouth sores.   Eyes: Negative for visual disturbance.  Respiratory: Negative for shortness of breath.   Cardiovascular: Negative for chest pain.  Gastrointestinal: Negative for abdominal pain, nausea and vomiting.  Genitourinary: Negative for genital sores.  Musculoskeletal: Negative for arthralgias and joint swelling.  Skin: Positive for color change and wound. Negative for rash.  Neurological: Negative for dizziness, weakness, light-headedness and headaches.     Physical Exam Triage Vital Signs ED Triage Vitals  Enc Vitals Group     BP 02/09/20 1325 (!) 142/86     Pulse Rate 02/09/20 1325 83  Resp 02/09/20 1325 16     Temp 02/09/20 1325 98.2 F (36.8 C)     Temp Source 02/09/20 1325 Oral     SpO2 02/09/20 1325 98 %     Weight --      Height --      Head Circumference --      Peak Flow --      Pain Score 02/09/20 1333 1     Pain Loc --      Pain Edu? --      Excl. in [place]? --    No data found.  Updated Vital Signs BP (!) 142/86 (BP Location: Right Arm)   Pulse 83   Temp 98.2 F (36.8 C) (Oral)   Resp 16   LMP 03/31/2017   SpO2 98%   Visual Acuity Right  Eye Distance:   Left Eye Distance:   Bilateral Distance:    Right Eye Near:   Left Eye Near:    Bilateral Near:     Physical Exam Vitals and nursing note reviewed.  Constitutional:      Appearance: She is well-developed.     Comments: No acute distress  HENT:     Head: Normocephalic and atraumatic.     Nose: Nose normal.  Eyes:     Conjunctiva/sclera: Conjunctivae normal.  Cardiovascular:     Rate and Rhythm: Normal rate.  Pulmonary:     Effort: Pulmonary effort is normal. No respiratory distress.  Abdominal:     General: There is no distension.  Musculoskeletal:        General: Normal range of motion.     Cervical back: Neck supple.     Comments: Full active range of motion of left wrist and elbow, radial pulse 2+  Skin:    General: Skin is warm and dry.     Comments: Left forearm with multiple puncture wounds and abrasions, mild surrounding erythema, no obvious pustular drainage noted  Neurological:     Mental Status: She is alert and oriented to person, place, and time.      UC Treatments / Results  Labs (all labs ordered are listed, but only abnormal results are displayed) Labs Reviewed - No data to display  EKG   Radiology No results found.  Procedures Procedures (including critical care time)  Medications Ordered in UC Medications - No data to display  Initial Impression / Assessment and Plan / UC Course  I have reviewed the triage vital signs and the nursing notes.  Pertinent labs & imaging results that were available during my care of the patient were reviewed by me and considered in my medical decision making (see chart for details).     Placing on Augmentin, continue anti-inflammatories for pain and swelling, monitor for gradual healing.  Discussed strict return precautions. Patient verbalized understanding and is agreeable with plan.  Final Clinical Impressions(s) / UC Diagnoses   Final diagnoses:  Dog bite of left forearm, initial  encounter     Discharge Instructions     Augmentin twice daily for 1 week Use anti-inflammatories for pain/swelling. You may take up to 800 mg Ibuprofen every 8 hours with food. You may supplement Ibuprofen with Tylenol [id number] mg every 8 hours.  Monitor for gradual healing   ED Prescriptions    Medication Sig Dispense Auth. Provider   amoxicillin-clavulanate (AUGMENTIN) 875-125 MG tablet  (Status: Discontinued) Take 1 tablet by mouth every 12 (twelve) hours for 7 days. 14 tablet Corbitt Cloke, [place] C, PA-C  amoxicillin-clavulanate (AUGMENTIN) 875-125 MG tablet Take 1 tablet by mouth every 12 (twelve) hours for 7 days. 14 tablet Verita Kuroda, [place] C, PA-C     PDMP not reviewed this encounter.   Janith Lima, [place] 02/10/20 [phone]

## 2020-02-11 MED ORDER — AMLODIPINE BESYLATE 10 MG OR TABS
ORAL_TABLET | ORAL | 0 refills | Status: DC
Start: 2020-02-11 — End: 2020-04-21

## 2020-02-11 NOTE — Telephone Encounter (Signed)
Patient is due for yearly exam.  One refill authorized.  Please schedule follow up visit.

## 2020-02-18 ENCOUNTER — Encounter (HOSPITAL_BASED_OUTPATIENT_CLINIC_OR_DEPARTMENT_OTHER): Payer: Medicare HMO

## 2020-02-28 ENCOUNTER — Other Ambulatory Visit (HOSPITAL_BASED_OUTPATIENT_CLINIC_OR_DEPARTMENT_OTHER): Payer: Self-pay | Admitting: Naturopath

## 2020-02-28 DIAGNOSIS — R7309 Other abnormal glucose: Secondary | ICD-10-CM

## 2020-02-29 MED ORDER — METFORMIN HCL 1000 MG OR TABS
ORAL_TABLET | ORAL | 0 refills | Status: DC
Start: 2020-02-29 — End: 2020-09-22

## 2020-02-29 NOTE — Telephone Encounter (Signed)
Please call. Patient is due for in person visit and labs

## 2020-02-29 NOTE — Telephone Encounter (Signed)
Refill request received for METFORMIN has been approved. However last SCR was 12/2018 .  Forward to PCP to review.

## 2020-02-29 NOTE — Telephone Encounter (Signed)
Patient last seen on 08/09/19 for ANXIETY/DEPRESSION and was to return in 3 WEEKS.  One refill authorized.  Please schedule follow up visit.-3RD REMINDER

## 2020-03-10 ENCOUNTER — Encounter (HOSPITAL_BASED_OUTPATIENT_CLINIC_OR_DEPARTMENT_OTHER): Payer: Medicare HMO

## 2020-03-31 ENCOUNTER — Encounter (HOSPITAL_BASED_OUTPATIENT_CLINIC_OR_DEPARTMENT_OTHER): Payer: Medicare HMO

## 2020-04-08 ENCOUNTER — Other Ambulatory Visit: Payer: Self-pay

## 2020-04-20 NOTE — Progress Notes (Signed)
[facility]  OUTPATIENT FOLLOW-UP NOTE      Patient Name:  Holly Blair  Date of Birth: 08-20-1965  Medical Record Number: [id number]  Primary Care Physician:  Eldred Manges, ARNP  Referring Physician:  Eldred Manges, ARNP    Chief Complaint/Reason for Visit  Follow-Up       INTERVAL HISTORY  Ms. Brunson returns today for routine follow up, having last been seen in clinic approximately 8 months ago.  In the interim, she has been doing quite well. 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 She has been walking a fair amount and making use of the pool. She is back at the [facility] and does walking laps around the pool. She has lost some weight with her increased physical activity and has been mindful of her salt intake.    She denies chest pain, palpitations, shortness of breath, LE edema, PND and orthopnea.    PAST MEDICAL HISTORY/PAST SURGICAL HISTORY/PROBLEM LIST/SOCIAL HISTORY/FAMILY HISTORY:  I have reviewed the patient's recorded medical history including the past medical history, past surgical history, problem list, social history and family history and confirmed it with the patient. The following are not included in the HPI and are relevant to this encounter:    1. OSA  2. Obesity  3. IDA  4. Pulmonary hypertension  5. Chronic venous insufficiency  6. HFpEF  7. Mild aortic stenosis    CURRENT MEDICATIONS  Outpatient Medications Marked as Taking for the 04/21/20 encounter (Office Visit) with Levy Sjogren, MD   Medication Sig Dispense Refill   . acetaminophen 325 MG tablet Take 2 tablets (650 mg) by mouth every 4 hours as needed (pain). 60 tablet 1   . amLODIPine 10 MG tablet Take 1 tablet (10 mg) by mouth daily. 90 tablet 3   . atorvastatin 20 MG tablet Take 1 tablet (20 mg) by mouth daily. 90 tablet 3   . buPROPion XL 150 MG 24 hr tablet Take 1 tablet (150 mg) by mouth daily. Take witht he 300 mg dose. 90 tablet 3   . buPROPion XL  300 MG 24 hr tablet Take 1 tablet (300 mg) by mouth daily. 90 tablet 3   . clotrimazole 1 % cream Apply to affected area on abdomen 2 times a day. For fungal rash. 45 g 2   . FLUoxetine 20 MG capsule Take 3 capsules (60 mg) by mouth daily. 270 capsule 3   . furosemide 20 MG tablet Take 1 tablet (20 mg) by mouth daily. 90 tablet 3   . metFORMIN 1000 MG tablet TAKE 1 TABLET BY MOUTH TWICE A DAY 60 tablet 0   . metroNIDAZOLE 0.75 % topical gel Apply to affected area on face 2 times a day. 1 Tube 3   . mupirocin 2 % ointment Apply to open lesions on lower legs and ankles three times daily 1 Tube 0   . nystatin (Nystop) 100000 UNIT/GM powder APPLY TO AFFECTED AREA TWICE A DAY 15 g 2   . nystatin 100000 UNIT/GM cream APPLY TO THE AFFECTED AREAS UNDER BREASTS TWICE DAILY AS DIRECTED 30 g 0   . oxybutynin 5 MG tablet Take 1 tablet (5 mg) by mouth 2 times a day. Take up to 3 times a day 90 tablet 12   . triamcinolone 0.1 % cream Apply to affected area on leg(s) 2 times a day. 1 Tube 3  ALLERGIES  Review of patient's allergies indicates:  No Known Allergies     PHYSICAL EXAM  BP 116/75   Pulse 71   Temp 36.5 C (Temporal)   Ht _0  (1.626 m)   Wt (!) 152.4 kg (336 lb)   LMP 03/31/2017 (Within Days)   SpO2 98%   BMI 57.67 kg/m   GENERAL:  Well appearing, resting comfortably and in no acute distress.    HEENT:  Moist mucous membranes and anicteric sclerae.  Marland Kitchen  LUNGS:  clear to auscultation bilaterally without wheezes, rales or rhonchi.   CARDIAC:   Regular rate and rhythm with a normal intensity S1 and S2.  There are no murmurs, rubs or gallops.  The JVP is estimated to be < 8 cm  EXTREMITIES:  warm and well-perfused without cyanosis. No bilateral lower extremity edema.  SKIN:  no erythema, rashes or breakdown.    NEUROLOGIC:  alert and oriented x 3.    PSYCH:  appropriate mood and affect.     OBJECTIVE DATA  Laboratory Results:   Lab Results   Component Value Date/Time    WBC 10.98 (H) 12/27/2018 10:21 AM     HEMATOCRIT 32 (L) 12/27/2018 10:21 AM    HEMOGLOBIN 9.3 (L) 12/27/2018 10:21 AM    PLATELET 367 12/27/2018 10:21 AM     Lab Results   Component Value Date/Time    SODIUM 137 12/27/2018 10:21 AM    POTASSIUM 4.2 12/27/2018 10:21 AM    BUN 12 12/27/2018 10:21 AM    CREATININE 0.88 12/27/2018 10:21 AM    GLUCOSE 109 12/27/2018 10:21 AM    AST 12 12/27/2018 10:21 AM    ALT 10 12/27/2018 10:21 AM    BILIRUBN 0.7 12/27/2018 10:21 AM    ALK 97 12/27/2018 10:21 AM    ALBUMIN 3.8 12/27/2018 10:21 AM    PROTEIN 7.9 12/27/2018 10:21 AM     Cholesterol (Total)   Date Value Ref Range Status   12/27/2018 165 <200 mg/dL Final     Triglyceride   Date Value Ref Range Status   12/27/2018 109 <150 mg/dL Final     Cholesterol (HDL)   Date Value Ref Range Status   12/27/2018 42 >39 mg/dL Final     Cholesterol (LDL)   Date Value Ref Range Status   12/27/2018 101 <130 mg/dL Final     Non-HDL Cholesterol   Date Value Ref Range Status   12/27/2018 123 0 - 159 mg/dL Final     Cholesterol/HDL Ratio   Date Value Ref Range Status   12/27/2018 3.9  Final     Lab Results   Component Value Date/Time    BNAP 110 (H) 11/19/2018 06:13 PM     ECHO:  TTE (date: 11/20/18)  1. Normal LV size and systolic function  2. Evidence of diastolic compliance abnormality  3. Mild aortic stenosis  4. 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    TTE (date: 08/17/18)  Normal LV size and systolic function (EF 60%). No regional wall motion abnormalities.  Normal RV size and function.   Trace to mild aortic insufficiency. Mild-moderate aortic stenosis. Otherwise normal valvular function.   Mild pulmonary hypertension. Estimated PASP 39-50mHg based on an estimated right atrial pressure of 5-174mg.  No pericardial effusion.    TTE (date: 07/24/2019):  1. 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 There  is associated mild  regurgitation. Guidelines suggest repeat echo in 3-5 years.   2. Normal left ventricular chamber size with hyperdynamic systolic function (EF 33%). No regional wall motion abnormalities seen. Impaired LV diastolic relaxation with likely normal filling pressures.  3. here is mild mitral annular calcification. Otherwise normal valvular anatomy and function.   4. Right ventricle not well visualized but probably normal size and function.   5. Mild pulmonary hypertension (PASP 36-41 mmHg) with mild central venous pressure (5-10 mmHg).  6. No pericardial effusion.  Compared to prior study, no major changes.     Other Imaging:  CT Chest PE Protocol (date: 11/19/18)  No evidence of pulmonary embolism.  -Mildly enlarged bilateral axillary lymph nodes, indeterminate in etiology.   Consider further investigation if clinically indicated.  -Dilated main pulmonary artery and reversal of main pulmonary artery to aorta   ratio indicates presence of pulmonary hypertension.  -Combination of groundglass opacity, minimal septal thickening, and bilateral   pleural effusions represents presence of pulmonary edema.    ASSESSMENT/PLAN/RECOMMENDATIONS  Felipe Cabell is a 54 year old female with history of HFpEF, OSA, obesity, IDA, pulmonary hypertension, and chronic venous insufficiency who presents for follow-up.    1.Chronic diastolic heart failure, HFpEF  Evidence of diastolic dysfunction with elevated filling pressures despite difficult studies. No overt evidence of infiltrative disorders.She has stable weight on diuretic therapy and has been able to ambulate without limitations at home. On physical exam, she is euvolemic and has lost weight with increased physical activity  --Continue furosemide 40m   --Encouraged continued physical activity    2.Mild Aortic Stenosis  Difficult echo quality but likely at least mild aortic stenosis x 2 now  --Will plan to repeat echo in 3-5 years (2024 - 2026)    3.HTN  Continue  aggressive management of hypertension. BP is well controlled at today's visit  --Continue amlodipine10 mg once a day and furosemide 20 mg once a day  --Her BP is well controlled in clinic today    4. Hyperlipidemia:  LDL from July 2020 is 101  --Will keep on Atorvastatin 20 mg Qhs    Return in about 6 months (around 10/19/2020).    Author:  [place]y [place]

## 2020-04-21 ENCOUNTER — Ambulatory Visit: Payer: Medicare HMO | Attending: Cardiology | Admitting: Cardiology

## 2020-04-21 VITALS — BP 116/75 | HR 71 | Temp 97.7°F | Ht 64.0 in | Wt 336.0 lb

## 2020-04-21 DIAGNOSIS — E7800 Pure hypercholesterolemia, unspecified: Secondary | ICD-10-CM | POA: Insufficient documentation

## 2020-04-21 DIAGNOSIS — I272 Pulmonary hypertension, unspecified: Secondary | ICD-10-CM | POA: Insufficient documentation

## 2020-04-21 DIAGNOSIS — I10 Essential (primary) hypertension: Secondary | ICD-10-CM | POA: Insufficient documentation

## 2020-04-21 DIAGNOSIS — I350 Nonrheumatic aortic (valve) stenosis: Secondary | ICD-10-CM | POA: Insufficient documentation

## 2020-04-21 DIAGNOSIS — I5032 Chronic diastolic (congestive) heart failure: Secondary | ICD-10-CM | POA: Insufficient documentation

## 2020-04-21 MED ORDER — FUROSEMIDE 20 MG OR TABS
20.0000 mg | ORAL_TABLET | Freq: Every day | ORAL | 3 refills | Status: DC
Start: 2020-04-21 — End: 2020-10-20

## 2020-04-21 MED ORDER — AMLODIPINE BESYLATE 10 MG OR TABS
10.0000 mg | ORAL_TABLET | Freq: Every day | ORAL | 3 refills | Status: DC
Start: 2020-04-21 — End: 2022-02-02

## 2020-04-21 MED ORDER — ATORVASTATIN CALCIUM 20 MG OR TABS
20.0000 mg | ORAL_TABLET | Freq: Every day | ORAL | 3 refills | Status: DC
Start: 2020-04-21 — End: 2021-02-08

## 2020-05-28 ENCOUNTER — Encounter (HOSPITAL_BASED_OUTPATIENT_CLINIC_OR_DEPARTMENT_OTHER): Payer: Medicare HMO | Admitting: Naturopath

## 2020-08-15 ENCOUNTER — Other Ambulatory Visit (HOSPITAL_BASED_OUTPATIENT_CLINIC_OR_DEPARTMENT_OTHER): Payer: Self-pay | Admitting: Naturopath

## 2020-08-15 DIAGNOSIS — F331 Major depressive disorder, recurrent, moderate: Secondary | ICD-10-CM

## 2020-08-18 MED ORDER — BUPROPION HCL ER (XL) 300 MG OR TB24
EXTENDED_RELEASE_TABLET | ORAL | 0 refills | Status: DC
Start: 2020-08-18 — End: 2020-09-02

## 2020-08-18 MED ORDER — BUPROPION HCL ER (XL) 150 MG OR TB24
EXTENDED_RELEASE_TABLET | ORAL | 0 refills | Status: DC
Start: 2020-08-18 — End: 2020-09-02

## 2020-08-18 NOTE — Telephone Encounter (Signed)
Patient is due for yearly exam.  One refill authorized.  Please schedule follow up visit.

## 2020-08-18 NOTE — Telephone Encounter (Signed)
Sent patient e-ticket

## 2020-08-30 ENCOUNTER — Other Ambulatory Visit (HOSPITAL_BASED_OUTPATIENT_CLINIC_OR_DEPARTMENT_OTHER): Payer: Self-pay | Admitting: Naturopath

## 2020-08-30 DIAGNOSIS — F331 Major depressive disorder, recurrent, moderate: Secondary | ICD-10-CM

## 2020-09-02 MED ORDER — BUPROPION HCL ER (XL) 300 MG OR TB24
EXTENDED_RELEASE_TABLET | ORAL | 0 refills | Status: DC
Start: 2020-09-02 — End: 2020-12-01

## 2020-09-02 MED ORDER — BUPROPION HCL ER (XL) 150 MG OR TB24
EXTENDED_RELEASE_TABLET | ORAL | 0 refills | Status: DC
Start: 2020-09-02 — End: 2020-12-01

## 2020-09-02 NOTE — Telephone Encounter (Signed)
Patient requesting pharmacy change.-[facility]

## 2020-09-03 ENCOUNTER — Other Ambulatory Visit (HOSPITAL_BASED_OUTPATIENT_CLINIC_OR_DEPARTMENT_OTHER): Payer: Self-pay | Admitting: Naturopath

## 2020-09-03 ENCOUNTER — Telehealth (HOSPITAL_BASED_OUTPATIENT_CLINIC_OR_DEPARTMENT_OTHER): Payer: Self-pay | Admitting: Naturopath

## 2020-09-03 DIAGNOSIS — F419 Anxiety disorder, unspecified: Secondary | ICD-10-CM

## 2020-09-03 DIAGNOSIS — Z1231 Encounter for screening mammogram for malignant neoplasm of breast: Secondary | ICD-10-CM

## 2020-09-03 MED ORDER — FLUOXETINE HCL 20 MG OR CAPS
60.0000 mg | ORAL_CAPSULE | Freq: Every day | ORAL | 0 refills | Status: DC
Start: 2020-09-03 — End: 2020-09-05

## 2020-09-03 NOTE — Telephone Encounter (Signed)
Patient requesting FLUoxetine refill

## 2020-09-04 ENCOUNTER — Telehealth (HOSPITAL_BASED_OUTPATIENT_CLINIC_OR_DEPARTMENT_OTHER): Payer: Self-pay | Admitting: Naturopath

## 2020-09-04 DIAGNOSIS — F419 Anxiety disorder, unspecified: Secondary | ICD-10-CM

## 2020-09-04 NOTE — Telephone Encounter (Signed)
Pt saw her sig to take fluoxetine 20mg , take 3 pills by mouth daily. She had wanted to know whether to take them all at once or three times daily.    Explained her dose is 60mg  once daily, so take all three pills at once.    Also noted she had two appts on 10/21/20. Clarified her preferred time for 3pm and cancelled the others.

## 2020-09-04 NOTE — Telephone Encounter (Signed)
Pt called to know when/what time of the day should she take her medication. Requested call back.

## 2020-09-05 MED ORDER — FLUOXETINE HCL 20 MG OR CAPS
60.0000 mg | ORAL_CAPSULE | Freq: Every day | ORAL | 0 refills | Status: DC
Start: 2020-09-05 — End: 2020-12-01

## 2020-09-05 NOTE — Telephone Encounter (Signed)
She should take all at once either in the morning or evening. I will give her

## 2020-09-21 ENCOUNTER — Other Ambulatory Visit (HOSPITAL_BASED_OUTPATIENT_CLINIC_OR_DEPARTMENT_OTHER): Payer: Self-pay | Admitting: Naturopath

## 2020-09-21 DIAGNOSIS — R7309 Other abnormal glucose: Secondary | ICD-10-CM

## 2020-09-22 NOTE — Telephone Encounter (Signed)
This request falls outside of refill center's protocol:    Metformin-last found metabolic panel on 08/25/77, overdue for labs.  Defer to provider.    If this medication is denied, please have your staff inform the patient and next visit on 10/21/20

## 2020-09-23 MED ORDER — METFORMIN HCL 1000 MG OR TABS
1000.0000 mg | ORAL_TABLET | Freq: Two times a day (BID) | ORAL | 0 refills | Status: DC
Start: 2020-09-23 — End: 2020-10-08

## 2020-10-08 ENCOUNTER — Other Ambulatory Visit (HOSPITAL_BASED_OUTPATIENT_CLINIC_OR_DEPARTMENT_OTHER): Payer: Self-pay | Admitting: Naturopath

## 2020-10-08 DIAGNOSIS — R7309 Other abnormal glucose: Secondary | ICD-10-CM

## 2020-10-08 NOTE — Telephone Encounter (Signed)
90 days supply request

## 2020-10-09 MED ORDER — METFORMIN HCL 1000 MG OR TABS
1000.0000 mg | ORAL_TABLET | Freq: Two times a day (BID) | ORAL | 0 refills | Status: DC
Start: 2020-10-09 — End: 2020-12-23

## 2020-10-09 NOTE — Telephone Encounter (Signed)
This request falls outside refill center's protocols: Metformin, overdue for labs- 90 days supply requested.    Last metabolic panel 08[birth year]21[birth year]

## 2020-10-16 ENCOUNTER — Other Ambulatory Visit (HOSPITAL_BASED_OUTPATIENT_CLINIC_OR_DEPARTMENT_OTHER): Payer: Self-pay | Admitting: Naturopath

## 2020-10-16 DIAGNOSIS — I272 Pulmonary hypertension, unspecified: Secondary | ICD-10-CM

## 2020-10-20 MED ORDER — FUROSEMIDE 20 MG OR TABS
ORAL_TABLET | ORAL | 3 refills | Status: DC
Start: 2020-10-20 — End: 2021-02-02

## 2020-10-20 NOTE — Telephone Encounter (Signed)
This medication is outside of the Refill Center's protocols. Please sign and close the encounter if you approve:     furosemide -  Most recent labs in Epic 2020    If this medication is denied please have your staff inform the patient and schedule an appointment if necessary. THANK YOU.

## 2020-10-21 ENCOUNTER — Encounter (HOSPITAL_BASED_OUTPATIENT_CLINIC_OR_DEPARTMENT_OTHER): Payer: Self-pay | Admitting: Naturopath

## 2020-10-21 ENCOUNTER — Ambulatory Visit (HOSPITAL_BASED_OUTPATIENT_CLINIC_OR_DEPARTMENT_OTHER): Payer: Medicare HMO

## 2020-10-21 ENCOUNTER — Encounter (HOSPITAL_BASED_OUTPATIENT_CLINIC_OR_DEPARTMENT_OTHER): Payer: Medicare HMO | Admitting: Naturopath

## 2020-11-06 ENCOUNTER — Telehealth (HOSPITAL_BASED_OUTPATIENT_CLINIC_OR_DEPARTMENT_OTHER): Payer: Self-pay | Admitting: Naturopath

## 2020-11-06 NOTE — Telephone Encounter (Signed)
Mardene Celeste from [facility] medication support called inquiring about patients prescription, metFORMIN 1000 mg tab. The patient states she is no longer taking the medication, requested that confirm and provide the date that the patient stopped taking this medication.

## 2020-11-10 NOTE — Telephone Encounter (Signed)
I have no idea. She is long overdue for a visit so please get her schedule. I have not seen her since 2020

## 2020-11-11 NOTE — Telephone Encounter (Signed)
[facility] called clinic again inquiring about metformin 1000 mg tabs. Please follow up.

## 2020-11-12 NOTE — Telephone Encounter (Signed)
I called Holly Blair and got VM. I requested she call us back to discuss whether she is taking her Metformin or not because per our records this medication was not Dc'd.      I also requested she make an appointment to be seen by [name] as she has not been seen for a significa of amount of time.    Lesle Chris Rn

## 2020-11-13 NOTE — Telephone Encounter (Signed)
Pt called back. Tried to transfer to the nurse, but nurse was busy.    Pt states that she does not take Metformin and does not need it. She told the same thing to her pharmacy when they called her to know if she is taking it.

## 2020-11-14 NOTE — Telephone Encounter (Signed)
Call and notified [facility] of when pt dc'd her metformin.

## 2020-11-18 ENCOUNTER — Encounter (HOSPITAL_BASED_OUTPATIENT_CLINIC_OR_DEPARTMENT_OTHER): Payer: Medicare HMO | Admitting: Naturopath

## 2020-11-24 ENCOUNTER — Ambulatory Visit (HOSPITAL_BASED_OUTPATIENT_CLINIC_OR_DEPARTMENT_OTHER): Payer: Medicare HMO

## 2020-12-01 ENCOUNTER — Telehealth (HOSPITAL_BASED_OUTPATIENT_CLINIC_OR_DEPARTMENT_OTHER): Payer: Self-pay | Admitting: Naturopath

## 2020-12-01 DIAGNOSIS — F331 Major depressive disorder, recurrent, moderate: Secondary | ICD-10-CM

## 2020-12-01 DIAGNOSIS — F419 Anxiety disorder, unspecified: Secondary | ICD-10-CM

## 2020-12-01 NOTE — Telephone Encounter (Signed)
Situation: Received 6/13 pt call request:    Mylinda Latina, PSS/PSR 12/01/20 1:20 PM  Pt called to request her medication refill prescriptions to be sent to [facility] [place] [address]    Anti depressant pill and anxiety pill         Background: 55 yo F pt of [name], [place], last Phone Visit 08/09/2019. Pt is scheduled for 12/23/2020 1330 Annual Wellness Visit w/Keehn, ARNP.     Action/Assessment: Pt stated that she has moved and requested her pharmacies to transfer her prescriptions, but the pharmacy advised her to contact clinic. Updated pt's preferred pharmacy in [place]. Pt requests the following Rxs sent to [place] on [address]:    FLUoxetine 20 MG capsule Take 3 capsules (60 mg) by mouth daily.  buPROPion XL 150 MG 24 hr tablet TAKE 1 TABLET BY MOUTH DAILY. TAKE WITH THE 300 MG DOSE.  buPROPion XL 300 MG 24 hr table. TAKE 1 TABLET BY MOUTH EVERY DAY    Pt is not taking metformin, was previously stopped per 11/07/2019 TE. Pt stated that she has been off of fluoxetine and burpropion for 1 month related to losing her supply in [place]. She agreed to request her other Rxs from her Cardiology provider. Requests MyChart message when Rxs are sent. Agreed to keep her 12/23/2020 Medicare Annual Wellness Visit w/Keehn, ARNP.    Plan: Routed pended Rxs to pt's preferred pharmacy to [place] for review & consideration. [facility] Int Med RN Pool, Please sent MyChart message w/Rx request outcome. Thank you.     Junius Roads, BSN, Therapist, sports of [facility]

## 2020-12-01 NOTE — Telephone Encounter (Signed)
Pt called to request her medication refill prescriptions to be sent to [facility] [place] [address]      Anti depressant pill and anxiety pill

## 2020-12-02 MED ORDER — FLUOXETINE HCL 20 MG OR CAPS
60.0000 mg | ORAL_CAPSULE | Freq: Every day | ORAL | 0 refills | Status: DC
Start: 2020-12-02 — End: 2020-12-23

## 2020-12-02 MED ORDER — BUPROPION HCL ER (XL) 300 MG OR TB24
300.0000 mg | EXTENDED_RELEASE_TABLET | Freq: Every day | ORAL | 0 refills | Status: DC
Start: 2020-12-02 — End: 2020-12-23

## 2020-12-02 MED ORDER — BUPROPION HCL ER (XL) 150 MG OR TB24
150.0000 mg | EXTENDED_RELEASE_TABLET | Freq: Every day | ORAL | 0 refills | Status: DC
Start: 2020-12-02 — End: 2020-12-23

## 2020-12-03 ENCOUNTER — Ambulatory Visit (HOSPITAL_BASED_OUTPATIENT_CLINIC_OR_DEPARTMENT_OTHER): Payer: Medicare HMO

## 2020-12-03 ENCOUNTER — Encounter (HOSPITAL_BASED_OUTPATIENT_CLINIC_OR_DEPARTMENT_OTHER): Payer: Medicare HMO | Admitting: Naturopath

## 2020-12-23 ENCOUNTER — Ambulatory Visit (HOSPITAL_COMMUNITY): Payer: Self-pay

## 2020-12-23 ENCOUNTER — Encounter (HOSPITAL_BASED_OUTPATIENT_CLINIC_OR_DEPARTMENT_OTHER): Payer: Self-pay | Admitting: Naturopath

## 2020-12-23 ENCOUNTER — Ambulatory Visit (HOSPITAL_BASED_OUTPATIENT_CLINIC_OR_DEPARTMENT_OTHER): Payer: Medicare HMO | Admitting: Naturopath

## 2020-12-23 ENCOUNTER — Ambulatory Visit
Admission: RE | Admit: 2020-12-23 | Discharge: 2020-12-23 | Disposition: A | Discharge status: Home / Self Care | Payer: Medicare HMO | Attending: Diagnostic Radiology | Admitting: Diagnostic Radiology

## 2020-12-23 ENCOUNTER — Other Ambulatory Visit (HOSPITAL_BASED_OUTPATIENT_CLINIC_OR_DEPARTMENT_OTHER): Payer: Self-pay | Admitting: Naturopath

## 2020-12-23 ENCOUNTER — Encounter (HOSPITAL_BASED_OUTPATIENT_CLINIC_OR_DEPARTMENT_OTHER): Payer: Self-pay

## 2020-12-23 VITALS — BP 127/66 | HR 69 | Temp 98.0°F | Ht 64.0 in | Wt 324.8 lb

## 2020-12-23 DIAGNOSIS — Z1211 Encounter for screening for malignant neoplasm of colon: Secondary | ICD-10-CM | POA: Insufficient documentation

## 2020-12-23 DIAGNOSIS — R7989 Other specified abnormal findings of blood chemistry: Secondary | ICD-10-CM

## 2020-12-23 DIAGNOSIS — Z1329 Encounter for screening for other suspected endocrine disorder: Secondary | ICD-10-CM | POA: Insufficient documentation

## 2020-12-23 DIAGNOSIS — L719 Rosacea, unspecified: Secondary | ICD-10-CM | POA: Insufficient documentation

## 2020-12-23 DIAGNOSIS — E782 Mixed hyperlipidemia: Secondary | ICD-10-CM | POA: Insufficient documentation

## 2020-12-23 DIAGNOSIS — Z23 Encounter for immunization: Secondary | ICD-10-CM | POA: Insufficient documentation

## 2020-12-23 DIAGNOSIS — R7301 Impaired fasting glucose: Secondary | ICD-10-CM

## 2020-12-23 DIAGNOSIS — F331 Major depressive disorder, recurrent, moderate: Secondary | ICD-10-CM

## 2020-12-23 DIAGNOSIS — F419 Anxiety disorder, unspecified: Secondary | ICD-10-CM

## 2020-12-23 DIAGNOSIS — Z0000 Encounter for general adult medical examination without abnormal findings: Secondary | ICD-10-CM | POA: Insufficient documentation

## 2020-12-23 DIAGNOSIS — Z1231 Encounter for screening mammogram for malignant neoplasm of breast: Secondary | ICD-10-CM | POA: Insufficient documentation

## 2020-12-23 DIAGNOSIS — N3941 Urge incontinence: Secondary | ICD-10-CM | POA: Insufficient documentation

## 2020-12-23 DIAGNOSIS — B372 Candidiasis of skin and nail: Secondary | ICD-10-CM

## 2020-12-23 LAB — CBC, DIFF
% Basophils: 0 %
% Eosinophils: 3 %
% Immature Granulocytes: 1 %
% Lymphocytes: 34 %
% Monocytes: 4 %
% Neutrophils: 58 %
% Nucleated RBC: 0 %
Absolute Eosinophil Count: 0.35 10*3/uL (ref 0.00–0.50)
Absolute Lymphocyte Count: 4.12 10*3/uL (ref 1.00–4.80)
Basophils: 0.04 10*3/uL (ref 0.00–0.20)
Hematocrit: 36 % (ref 36.0–45.0)
Hemoglobin: 10.6 g/dL — ABNORMAL LOW (ref 11.5–15.5)
Immature Granulocytes: 0.06 10*3/uL — ABNORMAL HIGH (ref 0.00–0.05)
MCH: 23.7 pg — ABNORMAL LOW (ref 27.3–33.6)
MCHC: 29.6 g/dL — ABNORMAL LOW (ref 32.2–36.5)
MCV: 80 fL — ABNORMAL LOW (ref 81–98)
Monocytes: 0.51 10*3/uL (ref 0.00–0.80)
Neutrophils: 7.07 10*3/uL — ABNORMAL HIGH (ref 1.80–7.00)
Nucleated RBC: 0 10*3/uL
Platelet Count: 345 10*3/uL (ref 150–400)
RBC: 4.47 10*6/uL (ref 3.80–5.00)
RDW-CV: 17.7 % — ABNORMAL HIGH (ref 11.6–14.4)
WBC: 12.15 10*3/uL — ABNORMAL HIGH (ref 4.3–10.0)

## 2020-12-23 LAB — COMPREHENSIVE METABOLIC PANEL
ALT (GPT): 14 U/L (ref 7–33)
AST (GOT): 15 U/L (ref 9–38)
Albumin: 4 g/dL (ref 3.5–5.2)
Alkaline Phosphatase (Total): 112 U/L (ref 34–121)
Anion Gap: 10 (ref 4–12)
Bilirubin (Total): 0.7 mg/dL (ref 0.2–1.3)
Calcium: 9.5 mg/dL (ref 8.9–10.2)
Carbon Dioxide, Total: 26 meq/L (ref 22–32)
Chloride: 104 meq/L (ref 98–108)
Creatinine: 0.75 mg/dL (ref 0.38–1.02)
Glucose: 99 mg/dL (ref 62–125)
Potassium: 3.7 meq/L (ref 3.6–5.2)
Protein (Total): 8.8 g/dL — ABNORMAL HIGH (ref 6.0–8.2)
Sodium: 140 meq/L (ref 135–145)
Urea Nitrogen: 10 mg/dL (ref 8–21)
eGFR by CKD-EPI 2021: >60 mL/min/{1.73_m2} (ref >59)

## 2020-12-23 LAB — CHOLESTEROL (TOTAL & HDL)
Cholesterol/HDL Ratio: 4.6
HDL Cholesterol: 40 mg/dL (ref >39)
Total Cholesterol: 182 mg/dL (ref <200)

## 2020-12-23 LAB — TSH WITH REFLEXIVE FREE T4: TSH with Reflexive Free T4: 2.829 u[IU]/mL (ref 0.400–5.000)

## 2020-12-23 MED ORDER — OXYBUTYNIN CHLORIDE 5 MG OR TABS
5.0000 mg | ORAL_TABLET | Freq: Two times a day (BID) | ORAL | 12 refills | Status: DC
Start: 2020-12-23 — End: 2022-02-02

## 2020-12-23 MED ORDER — NYSTATIN 100000 UNIT/GM EX CREA
TOPICAL_CREAM | CUTANEOUS | 3 refills | Status: DC
Start: 2020-12-23 — End: 2022-02-02

## 2020-12-23 MED ORDER — ZOSTER VAC RECOMB ADJUVANTED 50 MCG/0.5ML IM SUSR
0.5000 mL | Freq: Once | INTRAMUSCULAR | Status: AC
Start: 2020-12-23 — End: 2020-12-23
  Administered 2020-12-23: 0.5 mL via INTRAMUSCULAR
  Filled 2020-12-23: qty 0.5

## 2020-12-23 MED ORDER — FLUOXETINE HCL 20 MG OR CAPS
60.0000 mg | ORAL_CAPSULE | Freq: Every day | ORAL | 3 refills | Status: DC
Start: 2020-12-23 — End: 2022-02-02

## 2020-12-23 MED ORDER — BUPROPION HCL ER (XL) 300 MG OR TB24
300.0000 mg | EXTENDED_RELEASE_TABLET | Freq: Every day | ORAL | 3 refills | Status: DC
Start: 2020-12-23 — End: 2021-03-16

## 2020-12-23 MED ORDER — NYSTATIN 100000 UNIT/GM EX POWD
CUTANEOUS | 2 refills | Status: DC
Start: 2020-12-23 — End: 2021-12-28

## 2020-12-23 MED ORDER — BUPROPION HCL ER (XL) 150 MG OR TB24
150.0000 mg | EXTENDED_RELEASE_TABLET | Freq: Every day | ORAL | 3 refills | Status: DC
Start: 2020-12-23 — End: 2021-03-16

## 2020-12-23 MED ORDER — METRONIDAZOLE 0.75 % EX GEL
Freq: Two times a day (BID) | CUTANEOUS | 3 refills | Status: DC
Start: 2020-12-23 — End: 2023-04-26

## 2020-12-23 NOTE — Progress Notes (Signed)
ANNUAL WELLNESS VISIT    Holly Blair is a 55 year old female who presents for an Annual Wellness Visit.   []  Initial   [x]  Subsequent        INFORMATION GATHERING:   The following areas were confirmed with patient/caregiver and/or updated in Epic at this visit:       Past Medical History   Past Surgical History   Family History   Social History    Current medications and supplements (including vitamins and calcium)   Allergies  [x]  All of the above components have been reviewed and updated.     Opioid Use:    [x]  Reviewed    The information below is up to date at the end of this visit:   Review of functional ability, hearing, fall risk and safety, diet, physical activity, and health habits  (via HRA or direct review with patient or caregiver)     The Health Risk Assessment (HRA) for today's visit was completed:    [x]  as Patient-Entered Questionnaire via eCare     []  as paper HRA form, completed by or with the patient or caregiver      []  in HRA template documentation, completed by staff or provider with patient or caregiver at visit        List of current providers and suppliers:     [x]   See Care Team Section   [x]   See EHR Encounters for [facility] Providers involved in care   []   Other providers and suppliers outside [facility]:     Depression screening:    PHQ-2: 6  PHQ-9 (if done): 7        Other issues discussed:    Her fiance passed away in 2022/07/10. She has been grieving. She is currently living with a friend and looking for a one bedroom apartment.     She has been off Metformin since April. She stopped because of GI side effects.She has not had her glucose checked since then.  GI symptoms resolved when she stopped Metformin.     She had emergency appendectomy in [place] in April.     EXAM:  BP 127/66   Pulse 69   Temp 36.7 C (Temporal)   Ht 5\' 4"  (1.626 m)   Wt (!) 147.3 kg (324 lb 12.8 oz)   LMP 03/31/2017 (Within Days)   SpO2 100%   BMI 55.75 kg/m         GAIT:    [x]  Normal,  stable, independent    []  Abnormal (describe):    As assessed by:     []  Direct Observation     []  Timed Up and Go     []  Other:  COGNITION:    [x]  Intact     []  Abnormal (describe):     As assessed by:      []  Direct Observation     []  Brief Cognitive Screen          ADVANCE CARE PLANNING (ACP)     Advance care planning:   []  Patient Accepted   [x]  Patient Declined     Check all that apply:   []  Advance directives are on file in her chart   []  Explained & discussed advance directives at this visit   []  Completed advance care planning form(s) at this visit   []  Other Advance Care Planning discussion at this visit (describe):       Time Spent on ACP: [x]  None       []   1-15 minutes      []  16-30 minutes      []  >30 minutes      ASSESSMENT:       Holly Blair was seen for her Annual Wellness Visit, including identification of risk factors & conditions that may affect her health and function in the future.            Actions at this visit:         Establishing or updating a written schedule of screening and prevention measures recommended and appropriate for Holly Blair for the next 5-10 years   Establishing or updating a list of her risk factors and conditions for which lifestyle or medical interventions are recommended or underway, including mental health risks and conditions, and including risks/benefits of treatment   Furnishing personalized health advice and, as appropriate, referrals to health education or preventive counseling services or programs (such as fall prevention, tobacco cessation, physical activity, nutrition, weight loss)    [x]  All of the above components have been reviewed and updated.     COUNSELING:      Based on today's evaluation, I recommend the following ways to improve your health or functioning.    You have the following risk factors and/or medical conditions for which there are recommended ways (included in this list) to help you stay as healthy as possible:  Obesity,  impaired fasting blood sugar and heart diease: continue walking for physical and mental health. Try increasing length by a couple minutes every day. Keep Cardiology appointment in August. Take your medications as prescribed.      Here are screening & prevention measures recommended for you:  Health Maintenance   Topic Date Due   . Hepatitis B Vaccine (1 of 3 - Risk 3-dose series) Never done   . Colorectal Cancer Screening  03/04/2020   . Breast Cancer Screening  07/14/2020   . Pneumococcal Vaccine: Pediatrics (0-5 years) and At-Risk Patients (6-64 years) (1 - PCV) 12/23/2021 (Originally 08/24/1971)   . COVID-19 Vaccine (3 - Booster for Janssen series) 01/01/2021   . Influenza Vaccine (1) 03/21/2021   . Diabetes A1c  03/23/2021   . Diabetes Eye Exam  05/06/2021   . Depression Monitoring (PHQ-9)  12/22/2021   . Diabetes Nephropathy: Kidney Disease Monitoring  12/23/2021   . Medicare Annual Wellness Visit  12/23/2021   . Diabetes Foot Exam  12/23/2021   . Cervical Cancer Screening  04/28/2022   . Lipid Disorders Screening  12/27/2023   . DTaP, Tdap, and Td Vaccines (2 - Td or Tdap) 06/11/2024   . Zoster Vaccine  Completed   . Hepatitis C Screening  Completed   . HIV Screening  Addressed   . Hepatitis A Vaccine  Aged Out       Please plan to have a Subsequent Annual Wellness Visit in 1 year.

## 2020-12-24 LAB — HEMOGLOBIN A1C, HPLC: Hemoglobin A1C: 6.4 % — ABNORMAL HIGH (ref 4.0–6.0)

## 2020-12-31 ENCOUNTER — Telehealth (HOSPITAL_BASED_OUTPATIENT_CLINIC_OR_DEPARTMENT_OTHER): Payer: Self-pay | Admitting: Naturopath

## 2020-12-31 NOTE — Result Encounter Note (Signed)
Patient notified to schedule a telemedicine appointment to discuss your lab results. I'm afraid she won't do it on her won. Can we cal to schedule her?

## 2020-12-31 NOTE — Telephone Encounter (Signed)
-----   Message from [place], [place] sent at 12/31/2020 10:11 AM PDT -----  Patient notified to schedule a telemedicine appointment to discuss your lab results. I'm afraid she won't do it on her won. Can we cal to schedule her?

## 2021-01-01 ENCOUNTER — Encounter (HOSPITAL_BASED_OUTPATIENT_CLINIC_OR_DEPARTMENT_OTHER): Payer: Self-pay

## 2021-01-01 LAB — OCCULT BLOOD BY IA, STL: Occult Bld 1 Result: NEGATIVE

## 2021-01-01 NOTE — Telephone Encounter (Signed)
ecare message sent

## 2021-01-01 NOTE — Telephone Encounter (Signed)
Left a voicemail to please schedule and appointment, call [phone] or schedule on MyChart.

## 2021-01-01 NOTE — Telephone Encounter (Signed)
Pt called and she scheduled telemed with [name] next week, per ecare message. She also wanted to let us know that she mailed her colon cancer screening materials.

## 2021-01-05 ENCOUNTER — Other Ambulatory Visit (HOSPITAL_BASED_OUTPATIENT_CLINIC_OR_DEPARTMENT_OTHER): Payer: Medicare HMO | Admitting: Hematology

## 2021-01-05 ENCOUNTER — Ambulatory Visit: Payer: Medicare HMO | Attending: Naturopath | Admitting: Naturopath

## 2021-01-05 DIAGNOSIS — D649 Anemia, unspecified: Secondary | ICD-10-CM

## 2021-01-05 DIAGNOSIS — D72829 Elevated white blood cell count, unspecified: Secondary | ICD-10-CM | POA: Insufficient documentation

## 2021-01-05 DIAGNOSIS — R7301 Impaired fasting glucose: Secondary | ICD-10-CM | POA: Insufficient documentation

## 2021-01-05 NOTE — Progress Notes (Signed)
Reason for eConsult:  Eldred Manges, ARNP submitted the following request:  NOTE: Please ensure all studies listed in this template are complete before ordering an eConsult.  NOTE: Please state whether or not the patient smokes tobacco.      I am requesting an eConsult for this 55 year old female with leukocytosis.       My clinical question is: Recommended further evaluation of ongoing leukocytosis and mild anemia. Patient had visit with Heme in 11/2018 but they were not able to make appointment.              The following studies are required prior to initiating an eConsult and are available in Epic, [facility] or [facility]:  -WBC differential (i.e. CBC with DIFF)  -Peripheral blood smear review by pathology- order code PATHRV   Lab Results       Component                Value               Date                       WBC                      12.15               12/23/2020                 RBC                      4.47                12/23/2020                 HEMOGLOBIN               10.6                12/23/2020                 HEMATOCRIT               36                  12/23/2020                 MCH                      23.7                12/23/2020                 MCHC                     29.6                12/23/2020                 RDWCV                    17.7                12/23/2020                 PERNEUT                  58  12/23/2020                 PERLYMPH                 34                  12/23/2020                 PERMONO                  4                   12/23/2020                 PEREOS                   3                   12/23/2020                 PERBASO                  0                   12/23/2020                 ANEUT                    7.07                12/23/2020                 ALYMPH                   4.12                12/23/2020                 AEOS                     0.35                12/23/2020                 ABASO                    0.04                 12/23/2020                 AIMG                     0.06                12/23/2020                 WBCMORPH                                     04/28/2017             See CBC - no additional findings       PLTMORPH                                     04/28/2017  See CBC - no additional findings  Lab Results       Component                Value               Date                       PATHRVW                                      07/28/2018             There is a mild leukocytosis with normal morphologic features.  She doesn't smoke tobacco.  If this clinical question is deemed too complex for eConsult, please  route back to me and I will discuss further with the patient.     Amado Nash, ARNP  01/05/2021  _____________________________________________________________________  After careful review of the patient's results above and the patient's information available in the medical record,  the following are my findings and recommendations:  Holly Money, MD  01/05/2021    1. Restatement of the question: Holly Blair is a 55 year old female who is referred for leukocytosis and microcytic anemia.  The anemia work-up is incomplete (no recent reticulocyte count, ferritin level, or iron studies are available at the time of my review).    2. Recommendation(s): Please request review of the peripheral blood smear by pathology.  In an abundance of caution, I suggest also excluding the presence of mutations known to be associated with myeloproliferative neoplasms (this can be accomplished with [facility] laboratory medicine test code [facility]) and the bcr/abl translocation ([facility] laboratory medicine test code QP210).  Assuming there are no concerning findings on the blood smear and all tested-for mutations and bcr/abl translocation are absent, no additional hematologic evaluation for the leukocytosis/neutrophilia would be recommended.  The elevated white blood cell count would be considered most likely reactive.    3. Rationale  and/or evidence for recommendation: The downward trend in MCV strongly suggests that the anemia may be due to iron deficiency.  Unless Holly Blair is having regular menstrual blood loss, occult blood loss from the GI tract should be excluded if iron deficiency is confirmed and the anemia resolves with iron supplementation (if iron deficiency is confirmed, intravenous iron sucrose may be a good option if Holly Blair has attempted oral iron supplementation without success).    Regarding the leukocytosis: although it is a diagnosis of exclusion, neutrophilia can be seen in patients with morbid obesity.  I note that this patient's BMI is 55.  Weight loss may correspond with a decrease in WBC count.  If not, other causes of underlying inflammation or infection should be considered.    4. Contingency plan: Please contact hematology if concerning abnormalities are noted on the blood smear or any of the tested-for mutations is present.  Also please recontact hematology if new CBC or WBC differential abnormalities are identified on future surveillance.    I spent a total time of 9 minutes reviewing and communicating the above findings and recommendations.    "This eConsult is based solely on the clinical information available to me in the patient's medical record and is provided without benefit of a comprehensive evaluation or physical examination of the patient. The information contained in this eConsult must be interpreted in light of any clinical issues or  changes in patient status that were not known to me at the time this eConsult was completed. You must rely on your own informed clinical judgment for decision making. If necessary, we can schedule the patient for an in-office consultation."

## 2021-01-05 NOTE — Progress Notes (Signed)
Is this visit being conducted using Telemedicine with live, interactive video and audio? Telemedicine (live, interactive video & audio)   Distant Site Telemedicine Encounter      I conducted this encounter via secure, live, face-to-face video conference with the patient. I reviewed the risks and benefits of telemedicine as pertinent to this visit and the patient agreed to proceed.    Provider Location: Off-site location (home, non-[place] location)  Patient Location: At home  Present with patient: No one else present       Telemedicine Consent   This visit was conducted via Telehealth in lieu of an in-person encounter in my clinic due to the current COVID-19 outbreak and the risk that an in-person visit could pose to the patient, clinic staff, or the general public.  I confirmed the patient's name, date of birth, and callback number:YES  I introduced myself and showed my badge: YES  I explained that this visit will be billed the same as a face-to-face visit and patient consented to use telemedicine for their medical care today:YES  Patient reported all questions regarding telemedicine have been answered: YES    I received consent from the patient to have the visit by secure audio and video, including the statement: "I cannot provide the same evaluation as in a face-to-face visit. I may need you to come in for further evaluation or care. If at any time your would like to be seen in-person, we will terminate the visit and connect you to the most feasible in-person care. The technology is encrypted and secure; however, no technology is 100% hack-proof. The technology is dependent on a reliable internet connection."       No chief complaint on file.       Subjective:     Holly Blair is a 55 year old female who presents on 01/05/2021.    She is here to discuss recent lab results.   She has impaired fasting blood sugar (fasting 99, HgbA1c 6.3). She was on Metformin in the past but did not tolerate medication. She prefers to  not try another medication.   Cholesterol levels are stable on atorvastatin.   She has persistent leokocytosis and mild microcytic anemia. Recent FIT test was negative and she is no longer menstruating. She had an appointment scheduled to see Hematology for this in June 2020 but was not able to make appointment and did not reschedule.   Her protein was elevated on CMP but was normal when it was checked 4 months ago.   In general, she feels well and healthy and has no physical complaints.        Objective:    Vitals: LMP 03/31/2017 (Within Days)   Physical Exam  NAD. Alert and oriented x 3. Mood and affect appropriate.       Assessment and Plan:        Diagnoses and all orders for this visit:    Leukocytosis, unspecified type  -     ECONSULT TO HEMATOLOGY    Anemia, unspecified type  -     ECONSULT TO HEMATOLOGY    Impaired fasting glucose    Reviewed lab results with patient. I will consult with Hematology on recommended evaluation of chronic leukocytosis and anemia. She will get further labs done at her upcoming appointment with Cardiology in August.     Recheck glucose in 3 months. Recommended increasing walking and meeting with nutritionist.

## 2021-01-07 ENCOUNTER — Other Ambulatory Visit (HOSPITAL_BASED_OUTPATIENT_CLINIC_OR_DEPARTMENT_OTHER): Payer: Self-pay | Admitting: Naturopath

## 2021-01-07 DIAGNOSIS — D72829 Elevated white blood cell count, unspecified: Secondary | ICD-10-CM

## 2021-01-07 DIAGNOSIS — D509 Iron deficiency anemia, unspecified: Secondary | ICD-10-CM

## 2021-02-01 NOTE — Progress Notes (Signed)
[facility]  OUTPATIENT FOLLOW-UP NOTE      Patient Name:  Holly Blair  Date of Birth: 1965/10/14  Medical Record Number: [id number]  Primary Care Physician:  Eldred Manges, ND    Chief Complaint/Reason for Visit  Follow-Up       INTERVAL HISTORY  Holly Blair returns today for routine follow up, having last been seen in clinic approximately 9 months ago.  In the interim, she has been doing ok. She lost her fiance in January 2022 which has been difficult for her. She misses him greatly.    She went on a trip with a friend to [place] and became ill. She presented to a local hospital and was admitted from 4/2-4/11 for emergency surgery. She is now s/p cecectomy/appendectomy and repair of strangulated ventral hernia with temporary mesh. She remained in [place] for almost a month. The trip was traumatic due to the friend she was with.    She has lost about 50 pounds since 2020. She has not had issues with her volume. She notes that her metformin was stopped in the hospital.    PAST MEDICAL HISTORY/PAST SURGICAL HISTORY/PROBLEM LIST/SOCIAL HISTORY/FAMILY HISTORY:  I have reviewed the patient's recorded medical history including the past medical history, past surgical history, problem list, social history and family history and confirmed it with the patient. The following are not included in the HPI and are relevant to this encounter:    1.OSA  2. Obesity  3.IDA  4. Pulmonary hypertension  5. Chronic venous insufficiency  6. HFpEF  7. Mild aortic stenosis  8. Has 4 children.    CURRENT MEDICATIONS  Outpatient Medications Marked as Taking for the 02/02/21 encounter (Office Visit) with Levy Sjogren, MD   Medication Sig Dispense Refill   . acetaminophen 325 MG tablet Take 2 tablets (650 mg) by mouth every 4 hours as needed (pain). 60 tablet 1   . amLODIPine 10 MG tablet Take 1 tablet (10 mg) by mouth daily. 90 tablet 3   . atorvastatin 20 MG tablet Take 1 tablet (20 mg) by  mouth daily. 90 tablet 3   . buPROPion XL 150 MG 24 hr tablet Take 1 tablet (150 mg) by mouth daily. In addition to the 300 mg dose. You must have a clinic appointment for further refills. 90 tablet 3   . buPROPion XL 300 MG 24 hr tablet Take 1 tablet (300 mg) by mouth daily. Take in addition to 150 mg dose. You must have a clinic appointment for further refills. 90 tablet 3   . clotrimazole 1 % cream Apply to affected area on abdomen 2 times a day. For fungal rash. 45 g 2   . FLUoxetine 20 MG capsule Take 3 capsules (60 mg) by mouth daily. You must have a clinic appointment for further refills. 270 capsule 3   . [DISCONTINUED] furosemide 20 MG tablet TAKE 1 TABLET BY MOUTH EVERY DAY 90 tablet 3   . metroNIDAZOLE 0.75 % topical gel Apply topically 2 times a day. 45 g 3   . mupirocin 2 % ointment Apply to open lesions on lower legs and ankles three times daily 1 Tube 0   . nystatin (Nystop) 100000 UNIT/GM powder APPLY TO AFFECTED AREA TWICE A DAY 15 g 2   . oxybutynin 5 MG tablet Take 1 tablet (5 mg) by mouth 2 times a day. Take up to 3 times a day 90 tablet 12   . triamcinolone  0.1 % cream Apply to affected area on leg(s) 2 times a day. 1 Tube 3       ALLERGIES  Review of patient's allergies indicates:  No Known Allergies     PHYSICAL EXAM  BP (!) 147/72   Pulse 75   Temp 36.6 C (Temporal)   Ht '5\' 4"'  (1.626 m)   Wt (!) 147.3 kg (324 lb 12.8 oz)   LMP 03/31/2017 (Within Days)   SpO2 98%   BMI 55.75 kg/m   GENERAL:  Well appearing, resting comfortably and in no acute distress.    HEENT:  Moist mucous membranes and anicteric sclerae.  LUNGS:  clear to auscultation bilaterally without wheezes, rales or rhonchi.   CARDIAC:   Regular rate and rhythm with a normal intensity S1 and S2.  There are no murmurs, rubs or gallops.  The JVP is estimated to be < 8 cm  ABDOMEN:  soft, non-tender, non-distended.   EXTREMITIES:  warm and well-perfused without cyanosis. Trace bilateral lower extremity edema.  SKIN:  no  erythema, rashes or breakdown.    NEUROLOGIC:  alert and oriented x 3.    PSYCH:  appropriate mood and affect.     OBJECTIVE DATA  Laboratory Results:   Lab Results   Component Value Date/Time    WBC 12.15 (H) 12/23/2020 02:36 PM    HEMATOCRIT 36 12/23/2020 02:36 PM    HEMOGLOBIN 10.6 (L) 12/23/2020 02:36 PM    PLATELET 345 12/23/2020 02:36 PM     Lab Results   Component Value Date/Time    SODIUM 140 12/23/2020 02:36 PM    POTASSIUM 3.7 12/23/2020 02:36 PM    BUN 10 12/23/2020 02:36 PM    CREATININE 0.75 12/23/2020 02:36 PM    GLUCOSE 99 12/23/2020 02:36 PM    AST 15 12/23/2020 02:36 PM    ALT 14 12/23/2020 02:36 PM    BILIRUBN 0.7 12/23/2020 02:36 PM    ALK 112 12/23/2020 02:36 PM    ALBUMIN 4.0 12/23/2020 02:36 PM    PROTEIN 8.8 (H) 12/23/2020 02:36 PM     Cholesterol (Total)   Date Value Ref Range Status   12/23/2020 182 <200 mg/dL Final     Triglyceride   Date Value Ref Range Status   12/27/2018 109 <150 mg/dL Final     Cholesterol (HDL)   Date Value Ref Range Status   12/23/2020 40 >39 mg/dL Final     Cholesterol (LDL)   Date Value Ref Range Status   12/27/2018 101 <130 mg/dL Final     Non-HDL Cholesterol   Date Value Ref Range Status   12/27/2018 123 0 - 159 mg/dL Final     Cholesterol/HDL Ratio   Date Value Ref Range Status   12/23/2020 4.6  Final     Comment:     For complete information to help interpret the Cholesterol/HDL Ratio Group,   please use the National Cholesterol Education Program (NCEP) guideline based   reference range comments found here: http://tests.labmed.SSLUsers.ch       Lab Results   Component Value Date/Time    BNAP 110 (H) 11/19/2018 06:13 PM     ECHO:  TTE (date: 11/20/18)  1. Normal LV size and systolic function  2. Evidence of diastolic compliance abnormality  3. Mild aortic stenosis  4. 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    TTE (date: 08/17/18)  Normal LV size and systolic function (EF 27%). No  regional wall motion abnormalities.  Normal RV size and function.   Trace to mild aortic insufficiency. Mild-moderate aortic stenosis. Otherwise normal valvular function.   Mild pulmonary hypertension. Estimated PASP 39-57mHg based on an estimated right atrial pressure of 5-162mg.  No pericardial effusion.    TTE (date: 07/24/2019):  1. 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 There is associated mild regurgitation. Guidelines suggest repeat echo in 3-5 years.   2. Normal left ventricular chamber size with hyperdynamic systolic function (EF 7519% No regional wall motion abnormalities seen. Impaired LV diastolic relaxation with likely normal filling pressures.  3. here is mild mitral annular calcification. Otherwise normal valvular anatomy and function.   4. Right ventricle not well visualized but probably normal size and function.   5. Mild pulmonary hypertension (PASP 36-41 mmHg) with mild central venous pressure (5-10 mmHg).  6. No pericardial effusion.  Compared to prior study, no major changes.     Other Imaging:  CT Chest PE Protocol (date: 11/19/18)  No evidence of pulmonary embolism.  -Mildly enlarged bilateral axillary lymph nodes, indeterminate in etiology. Consider further investigation if clinically indicated.  -Dilated main pulmonary artery and reversal of main pulmonary artery to aorta ratio indicates presence of pulmonary hypertension.  -Combination of groundglass opacity, minimal septal thickening, and bilateral pleural effusions represents presence of pulmonary edema.    ASSESSMENT/PLAN/RECOMMENDATIONS  TrQuinesha Selingers a [age]ear old female with history of HFpEF, OSA, obesity, IDA, pulmonary hypertension, and chronic venous insufficiency who presents for follow-up.    1.Chronic diastolic heart failure, HFpEF  Evidence of diastolic dysfunction with elevated filling pressures despite difficult studies. No overt evidence of  infiltrative disorders.She has stable weight on diuretic therapy and has been able to ambulate without limitations at home. On physical exam, she is euvolemic and has lost weight with increased physical activity. She has not had any recent admissions for volume overload.  --Continue furosemide 2061m--Encouraged continued physical activity  --If she develops future volume issues, can consider the addition of spironolactone, a SLGT2i or Entresto    2.Mild Aortic Stenosis  Difficult echo quality but likely at least mild aortic stenosisx 2 now  --Will plan to repeat echo in3-5 years (2024 - 2026)    3.HTN  BP elevated at today's visit but has been under control previously  --Continue amlodipine10 mg once a day and furosemide 20 mg once a day  --Will need to monitor for further elevated blood pressures.    4. Hyperlipidemia:  LDL from July 2020 is 101  --Will keep on Atorvastatin 20 mg Qhs  --Will recheck a cholesterol panel with goal LDL < 100    Return in about 1 year (around 02/02/2022).    I spent a total of 30 minutes for the patient's care on the date of the service.    Author:  JaiLevy SjogrenD [place]

## 2021-02-02 ENCOUNTER — Other Ambulatory Visit (HOSPITAL_BASED_OUTPATIENT_CLINIC_OR_DEPARTMENT_OTHER): Payer: Self-pay | Admitting: Naturopath

## 2021-02-02 ENCOUNTER — Other Ambulatory Visit: Payer: Self-pay

## 2021-02-02 ENCOUNTER — Ambulatory Visit: Payer: Medicare HMO | Attending: Cardiology | Admitting: Cardiology

## 2021-02-02 ENCOUNTER — Other Ambulatory Visit (HOSPITAL_BASED_OUTPATIENT_CLINIC_OR_DEPARTMENT_OTHER): Payer: Self-pay | Admitting: Cardiology

## 2021-02-02 VITALS — BP 147/72 | HR 75 | Temp 97.9°F | Ht 64.0 in | Wt 324.8 lb

## 2021-02-02 DIAGNOSIS — I272 Pulmonary hypertension, unspecified: Secondary | ICD-10-CM | POA: Insufficient documentation

## 2021-02-02 DIAGNOSIS — D72829 Elevated white blood cell count, unspecified: Secondary | ICD-10-CM

## 2021-02-02 DIAGNOSIS — E7800 Pure hypercholesterolemia, unspecified: Secondary | ICD-10-CM | POA: Insufficient documentation

## 2021-02-02 DIAGNOSIS — I350 Nonrheumatic aortic (valve) stenosis: Secondary | ICD-10-CM | POA: Insufficient documentation

## 2021-02-02 DIAGNOSIS — I5032 Chronic diastolic (congestive) heart failure: Secondary | ICD-10-CM

## 2021-02-02 DIAGNOSIS — D509 Iron deficiency anemia, unspecified: Secondary | ICD-10-CM

## 2021-02-02 LAB — LIPID PANEL
Cholesterol (LDL): 111 mg/dL (ref <130)
Cholesterol/HDL Ratio: 4
HDL Cholesterol: 44 mg/dL (ref >39)
Non-HDL Cholesterol: 133 mg/dL (ref 0–159)
Total Cholesterol: 177 mg/dL (ref <200)
Triglyceride: 108 mg/dL (ref <150)

## 2021-02-02 LAB — IRON BINDING CAPACITY (W/IRON, TRANSFERRIN & TRANSF SAT)
Iron, SRM: 31 ug/dL (ref 31–171)
Total Iron Binding Capacity: 385 ug/dL (ref 270–535)
Transferrin Saturation: 8 % — ABNORMAL LOW (ref 10–45)
Transferrin: 275 mg/dL (ref 192–382)

## 2021-02-02 LAB — FERRITIN: Ferritin: 23 ng/mL (ref 10–180)

## 2021-02-02 LAB — B_TYPE NATRIURETIC PEPTIDE: B_Type Natriuretic Peptide: 71 pg/mL (ref <101)

## 2021-02-02 LAB — RETICULOCYTE COUNT
Absolute Reticulocyte Count: 83 10*9/L (ref 25–125)
RETIC HEMOGLOBIN EQUIVALENT: 26.7 pg — ABNORMAL LOW (ref 28.0–38.0)
Reticulocyte Count, %: 1.9 % (ref 0.5–2.5)

## 2021-02-02 MED ORDER — FUROSEMIDE 20 MG OR TABS
20.0000 mg | ORAL_TABLET | Freq: Every day | ORAL | 3 refills | Status: DC
Start: 2021-02-02 — End: 2022-02-02

## 2021-02-03 LAB — BCR/ABL P210, QUANT BY RT PCR: BCR/ABL p210 Quant by RT PCR: NOT DETECTED

## 2021-02-05 LAB — CALR EXON 9 MUTATION REFLEXIVE: CALR EXON 9 MUTATION RESULT: NOT DETECTED

## 2021-02-05 LAB — JAK2 V617F DNA REFLEXIVE: JAK2 V617F Results: NOT DETECTED

## 2021-02-08 ENCOUNTER — Encounter (HOSPITAL_BASED_OUTPATIENT_CLINIC_OR_DEPARTMENT_OTHER): Payer: Self-pay | Admitting: Cardiology

## 2021-02-08 DIAGNOSIS — E7800 Pure hypercholesterolemia, unspecified: Secondary | ICD-10-CM

## 2021-02-08 MED ORDER — ATORVASTATIN CALCIUM 40 MG OR TABS
40.0000 mg | ORAL_TABLET | Freq: Every day | ORAL | 3 refills | Status: DC
Start: 2021-02-08 — End: 2022-02-02

## 2021-02-09 ENCOUNTER — Encounter (HOSPITAL_BASED_OUTPATIENT_CLINIC_OR_DEPARTMENT_OTHER): Payer: Medicare HMO | Admitting: Cardiology

## 2021-02-16 LAB — HEME SINGLE GENE BY NGS

## 2021-03-16 ENCOUNTER — Telehealth (HOSPITAL_BASED_OUTPATIENT_CLINIC_OR_DEPARTMENT_OTHER): Payer: Self-pay | Admitting: Naturopath

## 2021-03-16 DIAGNOSIS — F331 Major depressive disorder, recurrent, moderate: Secondary | ICD-10-CM

## 2021-03-16 MED ORDER — BUPROPION HCL ER (XL) 150 MG OR TB24
150.0000 mg | EXTENDED_RELEASE_TABLET | Freq: Every day | ORAL | 3 refills | Status: DC
Start: 2021-03-16 — End: 2022-02-02

## 2021-03-16 MED ORDER — BUPROPION HCL ER (XL) 300 MG OR TB24
300.0000 mg | EXTENDED_RELEASE_TABLET | Freq: Every day | ORAL | 3 refills | Status: DC
Start: 2021-03-16 — End: 2022-02-02

## 2021-03-16 NOTE — Telephone Encounter (Signed)
Pt is requesting refill for    buPROPion XL 150 MG 24 hr tablet       [facility]  [address] [place] [address]   Phone:  [phone]   Fax:  [phone]

## 2021-04-09 ENCOUNTER — Other Ambulatory Visit: Payer: Self-pay

## 2021-05-11 ENCOUNTER — Telehealth (HOSPITAL_BASED_OUTPATIENT_CLINIC_OR_DEPARTMENT_OTHER): Payer: Self-pay | Admitting: Naturopath

## 2021-05-11 NOTE — Telephone Encounter (Signed)
Called and LVM for pt. Sent MyChart message to pt as well.

## 2021-05-11 NOTE — Telephone Encounter (Signed)
Pt called regarding her buPROPion XL 300 MG 24 hr tablet       Medication. She is not sure what dosage to take. Pt also has 150mg  prescribed alongside this.     Please give pt guidance, thank you.

## 2021-05-19 ENCOUNTER — Encounter: Payer: Self-pay | Admitting: Gastroenterology

## 2021-07-21 ENCOUNTER — Encounter: Payer: BC Managed Care – PPO | Admitting: Gastroenterology

## 2021-08-04 ENCOUNTER — Institutional Professional Consult (permissible substitution): Payer: BC Managed Care – PPO | Admitting: Neurology

## 2021-08-31 ENCOUNTER — Telehealth (HOSPITAL_BASED_OUTPATIENT_CLINIC_OR_DEPARTMENT_OTHER): Payer: Self-pay | Admitting: Naturopath

## 2021-08-31 NOTE — Telephone Encounter (Signed)
Called to ask about pneumonia vaccines.    LVM for patient stating there are no vaccine recorded in her Cook chart.  Asked to please call clinic.

## 2021-08-31 NOTE — Telephone Encounter (Signed)
Pt calling to find out when she got her last  Pneumonia shot. Requested call back from RN.

## 2021-09-10 ENCOUNTER — Encounter: Payer: Self-pay | Admitting: *Deleted

## 2021-09-10 ENCOUNTER — Encounter: Payer: Self-pay | Admitting: Neurology

## 2021-09-10 ENCOUNTER — Other Ambulatory Visit: Payer: Self-pay

## 2021-09-10 ENCOUNTER — Ambulatory Visit: Payer: BC Managed Care – PPO | Admitting: Neurology

## 2021-09-10 VITALS — BP 112/82 | HR 77 | Ht 63.5 in | Wt 194.0 lb

## 2021-09-10 DIAGNOSIS — G4719 Other hypersomnia: Secondary | ICD-10-CM

## 2021-09-10 DIAGNOSIS — R0683 Snoring: Secondary | ICD-10-CM | POA: Diagnosis not present

## 2021-09-10 DIAGNOSIS — E669 Obesity, unspecified: Secondary | ICD-10-CM

## 2021-09-10 DIAGNOSIS — R002 Palpitations: Secondary | ICD-10-CM

## 2021-09-10 DIAGNOSIS — R0681 Apnea, not elsewhere classified: Secondary | ICD-10-CM | POA: Diagnosis not present

## 2021-09-10 DIAGNOSIS — G4763 Sleep related bruxism: Secondary | ICD-10-CM

## 2021-09-10 DIAGNOSIS — Z87898 Personal history of other specified conditions: Secondary | ICD-10-CM

## 2021-09-10 DIAGNOSIS — R519 Headache, unspecified: Secondary | ICD-10-CM

## 2021-09-10 NOTE — Progress Notes (Signed)
Subjective:  ?  ?Patient ID: Charlene Johnson is a 56 y.o. female. ? ?HPI ? ? ? ?Star Age, MD, PhD ?[facility] ?[address], [address] ?[id number] ?[place], [place] [address] ? ?Dear [name],  ? ?I saw your patient, Charlene Johnson, upon your kind request in my sleep clinic today for initial consultation of her sleep disorder, in particular, concern for underlying obstructive sleep apnea.  The patient is unaccompanied today.  As you know, [name] is a 56 year old right-handed woman with an underlying medical history of hyperlipidemia, pre-diabetes, palpitations, and obesity, who reports snoring and excessive daytime somnolence.  I reviewed your office note from 01/13/2021.  She has been on Ozempic injections but has only taken it once in the past 2 months.  She had some palpitations after taking it and also reports previously experiencing palpitations when she was on Saxenda.  Her Epworth sleepiness score is 21 out of 24, fatigue severity score is 49 out of 63.  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  She has to drive to [facility] from time to time and she does admit that she can get sleepy at the wheel.  She drinks caffeine in the form of soda, 1 bottle per day on average, caffeine in the morning, 1 or 2 cups/day.  She has no night to night nocturia but has occasionally woken up with a dull, achy headache.  She has no history of migraines, she is not aware of any history of sleep apnea in the family.  Her mom has recently reported snoring to her but also witnessed pauses in her breathing as they shared the room during a vacation recently.  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  She is single, she lives alone, she has 3 dogs in the household, she does have the TV on at night on low volume in her bedroom.  Bedtime is generally around 9:30 PM and rise time between 5:30 AM  and 6:30 AM.  She drinks alcohol rarely, she is a non-smoker.  She likes to drink chamomile tea at night to help wind down.  She has seen cardiology in the past for chest pain, dyspnea on exertion, and palpitations.  She had work-up with heart monitor, echocardiogram and EKG. ? ?Her Past Medical History Is Significant For: ?Past Medical History:  ?Diagnosis Date  ? Hyperlipemia   ? Prediabetes   ? S/P laparoscopic assisted vaginal hysterectomy (LAVH) 09/29/2017  ? Status post bilateral salpingectomy 09/29/2017  ? ? ?Her Past Surgical History Is Significant For: ?Past Surgical History:  ?Procedure Laterality Date  ? EYE SURGERY  05/1999  ? LASIX  ? LAPAROSCOPIC VAGINAL HYSTERECTOMY WITH SALPINGECTOMY Bilateral 09/29/2017  ? Procedure: LAPAROSCOPIC ASSISTED VAGINAL HYSTERECTOMY WITH SALPINGECTOMY;  Surgeon: Janyth Contes, MD;  Location: [facility];  Service: Gynecology;  Laterality: Bilateral;  ? TONSILLECTOMY    ? ? ?Her Family History Is Significant For: ?Family History  ?Problem Relation Age of Onset  ? Heart disease Father   ? Hyperlipidemia Father   ? Cancer Maternal Grandmother   ? Cancer Maternal Grandfather   ? Stroke Paternal Grandmother   ? Hypertension Paternal Grandmother   ? Cancer Paternal Grandfather   ? Lung disease Paternal Grandfather   ? ? ?Her Social History Is Significant For: ?Social History  ? ?Socioeconomic History  ? Marital status: Married  ?  Spouse name: Not  on file  ? Number of children: Not on file  ? Years of education: Not on file  ? Highest education level: Not on file  ?Occupational History  ? Not on file  ?Tobacco Use  ? Smoking status: Never  ? Smokeless tobacco: Never  ?Vaping Use  ? Vaping Use: Never used  ?Substance and Sexual Activity  ? Alcohol use: Yes  ?  Alcohol/week: 0.0 standard drinks  ?  Comment: ONCE A MONTH  ? Drug use: No  ? Sexual activity: Not on file  ?Other Topics Concern  ? Not on file  ?Social History Narrative  ? Caffeine 1-2 cups coffee, 1-2 diet soda and  0-1 piece chocolate daily  ? Occupation Forensic psychologist.  ? ?Social Determinants of Health  ? ?Financial Resource Strain: Not on file  ?Food Insecurity: Not on file  ?Transportation Needs: Not on file  ?Physical Activity: Not on file  ?Stress: Not on file  ?Social Connections: Not on file  ? ? ?Her Allergies Are:  ?No Known Allergies:  ? ?Her Current Medications Are:  ?Outpatient Encounter Medications as of 09/10/2021  ?Medication Sig  ? Naproxen Sodium (ALEVE PO) Aleve ? OTC-AS NEEDED  ? OZEMPIC, 0.25 OR 0.5 MG/DOSE, 2 MG/1.5ML SOPN SMARTSIG:0.25 Milligram(s) SUB-Q Once a Week  ? pravastatin (PRAVACHOL) 20 MG tablet Take 20 mg by mouth at bedtime.  ? [DISCONTINUED] Liraglutide -Weight Management (SAXENDA) 18 MG/3ML SOPN Inject into the skin.  ? ?No facility-administered encounter medications on file as of 09/10/2021.  ?: ? ? ?Review of Systems:  ?Out of a complete 14 point review of systems, all are reviewed and negative with the exception of these symptoms as listed below: ? ?Review of Systems  ?Neurological:   ?     Here for sleep consult. No prior sleep study; reports she does snore and day time  years. fatigue. Pt reports sx have been present over the last. ? ?ESS Total: 21 ?FSS Total:98  ? ?Objective:  ?Neurological Exam ? ?Physical Exam ?Physical Examination:  ? ?Vitals:  ? 09/10/21 1514  ?BP: 112/82  ?Pulse: 77  ?SpO2: 98%  ? ? ?General Examination: The patient is a very pleasant 56 y.o. female in no acute distress. She appears well-developed and well-nourished and well groomed.  ? ?HEENT: Normocephalic, atraumatic, pupils are equal, round and reactive to light, extraocular tracking is good without limitation to gaze excursion or nystagmus noted. Hearing is grossly intact. Face is symmetric with normal facial animation. Speech is clear with no dysarthria noted. There is no hypophonia. There is no lip, neck/head, jaw or voice tremor. Neck is supple with full range of passive and active motion. There are no carotid  bruits on auscultation. Oropharynx exam reveals: mild mouth dryness, adequate dental hygiene with wearing off teeth from bruxism, and mild airway crowding, due to small airway, tonsils absent, Mallampati class II, neck circumference of 15 three-quarter inches.  She has a minimal overbite.  Tongue protrudes centrally and palate elevates symmetrically. ? ?Chest: Clear to auscultation without wheezing, rhonchi or crackles noted. ? ?Heart: S1+S2+0, regular and normal without murmurs, rubs or gallops noted.  ? ?Abdomen: Soft, non-tender and non-distended with normal bowel sounds appreciated on auscultation. ? ?Extremities: There is no obvious edema.   ? ?Skin: Warm and dry without trophic changes noted.  ? ?Musculoskeletal: exam reveals no obvious joint deformities, tenderness or joint swelling or erythema.  ? ?Neurologically:  ?Mental status: The patient is awake, alert and oriented in all 4 spheres. Her immediate  and remote memory, attention, language skills and fund of knowledge are appropriate. There is no evidence of aphasia, agnosia, apraxia or anomia. Speech is clear with normal prosody and enunciation. Thought process is linear. Mood is normal and affect is normal.  ?Cranial nerves II - XII are as described above under HEENT exam.  ?Motor exam: Normal bulk, strength and tone is noted. There is no tremor, Romberg is negative. Reflexes are 2+ throughout. Fine motor skills and coordination: grossly intact.  ?Cerebellar testing: No dysmetria or intention tremor. There is no truncal or gait ataxia.  ?Sensory exam: intact to light touch in the upper and lower extremities.  ?Gait, station and balance: She stands easily. No veering to one side is noted. No leaning to one side is noted. Posture is age-appropriate and stance is narrow based. Gait shows normal stride length and normal pace. No problems turning are noted.  ? ?Assessment and Plan:  ?In summary, Emilyrose Darrah is a very pleasant 56 y.o.-year old female with an  underlying medical history of hyperlipidemia, pre-diabetes, palpitations, and obesity, whose history and physical exam concerning for obstructive sleep apnea (OSA). ?I had a long chat with the patient about

## 2021-09-10 NOTE — Patient Instructions (Signed)

## 2021-09-22 ENCOUNTER — Ambulatory Visit (INDEPENDENT_AMBULATORY_CARE_PROVIDER_SITE_OTHER): Payer: BC Managed Care – PPO | Admitting: Neurology

## 2021-09-22 DIAGNOSIS — G4719 Other hypersomnia: Secondary | ICD-10-CM

## 2021-09-22 DIAGNOSIS — E669 Obesity, unspecified: Secondary | ICD-10-CM

## 2021-09-22 DIAGNOSIS — G4720 Circadian rhythm sleep disorder, unspecified type: Secondary | ICD-10-CM

## 2021-09-22 DIAGNOSIS — R519 Headache, unspecified: Secondary | ICD-10-CM

## 2021-09-22 DIAGNOSIS — R0683 Snoring: Secondary | ICD-10-CM

## 2021-09-22 DIAGNOSIS — R002 Palpitations: Secondary | ICD-10-CM

## 2021-09-22 DIAGNOSIS — R0681 Apnea, not elsewhere classified: Secondary | ICD-10-CM

## 2021-09-22 DIAGNOSIS — Z87898 Personal history of other specified conditions: Secondary | ICD-10-CM

## 2021-10-02 NOTE — Procedures (Signed)
PATIENT'S NAME:  Charlene Johnson, Charlene Johnson ?DOB:      Dec 01, 1965      ?MR#:    [id number]     ?DATE OF RECORDING: 09/22/2021 ?REFERRING M.D.:  Samuel Bouche, FNP ?Study Performed:   Baseline Polysomnogram ?HISTORY: 56 year old woman with a history of hyperlipidemia, pre-diabetes, palpitations, and obesity, who reports snoring and excessive daytime somnolence. The patient endorsed the Epworth Sleepiness Scale at 21 points. The patient's weight 194 pounds with a height of 64 (inches), resulting in a BMI of 33.9 kg/m2. The patient's neck circumference measured 15.7 inches. ? ?CURRENT MEDICATIONS: Aleve, Ozempic, Pravachol ?  ?PROCEDURE:  This is a multichannel digital polysomnogram utilizing the Somnostar 11.2 system.  Electrodes and sensors were applied and monitored per AASM Specifications.   EEG, EOG, Chin and Limb EMG, were sampled at 200 Hz.  ECG, Snore and Nasal Pressure, Thermal Airflow, Respiratory Effort, CPAP Flow and Pressure, Oximetry was sampled at 50 Hz. Digital video and audio were recorded.     ? ?BASELINE STUDY ? ?Lights Out was at 21:48 and Lights On at 05:00.  Total recording time (TRT) was 432.5 minutes, with a total sleep time (TST) of 408.5 minutes.   The patient's sleep latency was 9.5 minutes.  REM latency was 54.5 minutes, which is slightly reduced. The sleep efficiency was 94.5%.  ?   ?SLEEP ARCHITECTURE: WASO (Wake after sleep onset) was 19.5 minutes with mild sleep fragmentation noted. There were 4 minutes in Stage N1, 143 minutes Stage N2, 144.5 minutes Stage N3 and 117 minutes in Stage REM.  The percentage of Stage N1 was 1.%, Stage N2 was 35.%, Stage N3 was 35.4%, which is increased, and Stage R (REM sleep) was 28.6%, which is mildly increased. The arousals were noted as: 30 were spontaneous, 0 were associated with PLMs, 0 were associated with respiratory events. ? ?RESPIRATORY ANALYSIS:  There were a total of 18 respiratory events:  0 obstructive apneas, 2 central apneas and 0 mixed apneas with a total  of 2 apneas and an apnea index (AI) of .3 /hour. There were 16 hypopneas with a hypopnea index of 2.4 /hour. The patient also had 0 respiratory event related arousals (RERAs).  ?    ?The total APNEA/HYPOPNEA INDEX (AHI) was 2.6/hour and the total RESPIRATORY DISTURBANCE INDEX was  2.6 /hour.  16 events occurred in REM sleep and 4 events in NREM. The REM AHI was  8.2 /hour, versus a non-REM AHI of .4. The patient spent 3.5 minutes of total sleep time in the supine position and 405 minutes in non-supine.. The supine AHI was 0.0 versus a non-supine AHI of 2.7. ? ?OXYGEN SATURATION & C02:  The Wake baseline 02 saturation was 95%, with the lowest being 80%. Time spent below 89% saturation equaled 5 minutes. ? ?PERIODIC LIMB MOVEMENTS:   ?The patient had a total of 24 Periodic Limb Movements.  The Periodic Limb Movement (PLM) index was 3.5 and the PLM Arousal index was 0/hour.  ? ?Audio and video analysis did not show any abnormal or unusual movements, behaviors, phonations or vocalizations. The patient took no bathroom breaks. Rare mild snoring was noted. The EKG was in keeping with normal sinus rhythm (NSR). ? ?Post-study, the patient indicated that sleep was the same as usual.  ? ?IMPRESSION: ? ?Primary Snoring ?Dysfunctions associated with sleep stages or arousal from sleep ? ?RECOMMENDATIONS: ? ?This study does not demonstrate any significant obstructive or central sleep disordered breathing.  ?This study shows mild sleep fragmentation and abnormal sleep  stage percentages; these are nonspecific findings and per se do not signify an intrinsic sleep disorder or a cause for the patient's sleep-related symptoms. Causes include (but are not limited to) the first night effect of the sleep study, circadian rhythm disturbances, medication effect or an underlying mood disorder or medical problem. Given her significant daytime somnolence, if metabolic and hormonal reasons for daytime sleepiness have been worked-up,  consideration should be given to have patient returns for extended sleep testing with a an overnight sleep study during, followed by a next day nap study (MSLT; mean sleep latency test) to evaluate her for an underlying hypersomnolence disorder, such as narcolepsy or idiopathic hypersomnolence. This avenue will be discussed with the patient.  ?The patient should be cautioned not to drive, work at heights, or operate dangerous or heavy equipment when tired or sleepy. Review and reiteration of good sleep hygiene measures should be pursued with any patient. ?The patient will be seen in follow-up in the sleep clinic at [facility] as necessary. The referring provider will be notified of the test results. ? ?I certify that I have reviewed the entire raw data recording prior to the issuance of this report in accordance with the Standards of Accreditation of the [place] [employer] (AASM) ? ? ?Star Age, MD, PhD ?Diplomat, American Board of Neurology and Sleep Medicine (Neurology and Sleep Medicine) ? ? ? ?

## 2021-10-06 ENCOUNTER — Telehealth: Payer: Self-pay | Admitting: *Deleted

## 2021-10-06 NOTE — Telephone Encounter (Addendum)
Pt returned my call. We discussed the sleep study results as noted below by Dr Rexene Alberts. Pt verbalized understanding. She was not sure she had testing yet to rule out metabolic and hormonal reasons for her daytime sleepiness so she will return to her PCP for now. Once testing is complete if she would like to proceed with advanced testing for possible diagnosis of Narcolepsy or idiopathic hypersomnolence she will reach out so we can schedule a quick f/u for testing. Pt verbalized appreciation for the call and her questions were answered. Result sent to referring provider and PCP.  ?

## 2021-10-06 NOTE — Telephone Encounter (Signed)
-----   Message from Star Age, MD sent at 10/02/2021  1:53 PM EDT ----- ?Patient referred by Samuel Bouche, NP, seen by me on 09/10/21, diagnostic PSG on 09/22/21.   ?Please call and notify the patient that the recent sleep study did not show any significant obstructive sleep apnea.  Given her significant daytime somnolence, if metabolic and hormonal reasons for daytime sleepiness have been worked-up by her PCP, such as vit D and B12 deficiency, thyroid problem, anemia, for example, we can consider having her returns for extended sleep testing with an overnight sleep study during, followed by a next day nap study (MSLT; mean sleep latency test) to evaluate her for an underlying hypersomnolence disorder, such as narcolepsy or idiopathic hypersomnolence. For now, she can followup with PCP, if she needs above recommended w/u. If she has had appropriate blood testing and would like to come back for extended testing, let's make a brief FU appointment with me to discuss. Please offer FU appt.  ? ?Thanks, ? ?Star Age, MD, PhD ?[facility]) ? ?

## 2021-10-06 NOTE — Telephone Encounter (Signed)
Called pt & LVM with office number asking for call back to discuss sleep study results.  ?

## 2021-10-12 ENCOUNTER — Telehealth (HOSPITAL_BASED_OUTPATIENT_CLINIC_OR_DEPARTMENT_OTHER): Payer: Self-pay | Admitting: Naturopath

## 2021-10-12 NOTE — Telephone Encounter (Signed)
Pt calling for the heads up. 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

## 2021-10-13 ENCOUNTER — Ambulatory Visit: Payer: Medicare HMO | Attending: Naturopath | Admitting: Naturopath

## 2021-10-13 DIAGNOSIS — R7301 Impaired fasting glucose: Secondary | ICD-10-CM | POA: Insufficient documentation

## 2021-10-13 NOTE — Progress Notes (Signed)
Is this visit being conducted using Telemedicine with live, interactive video and audio? Telemedicine (live, interactive video & audio)   Distant Site Telemedicine Encounter      I conducted this encounter via secure, live, face-to-face video conference with the patient. I reviewed the risks and benefits of telemedicine as pertinent to this visit and the patient agreed to proceed.    Provider Location: On-site location (clinic, hospital, on-site office)  Patient Location: At home  Present with patient: No one else present       Telemedicine Consent   This visit was conducted via Telehealth in lieu of an in-person encounter in my clinic due to the current COVID-19 outbreak and the risk that an in-person visit could pose to the patient, clinic staff, or the general public.  I confirmed the patient's name, date of birth, and callback number:YES  I introduced myself and showed my badge: YES  I explained that this visit will be billed the same as a face-to-face visit and patient consented to use telemedicine for their medical care today:YES  Patient reported all questions regarding telemedicine have been answered: YES    I received consent from the patient to have the visit by secure audio and video, including the statement: "I cannot provide the same evaluation as in a face-to-face visit. I may need you to come in for further evaluation or care. If at any time your would like to be seen in-person, we will terminate the visit and connect you to the most feasible in-person care. The technology is encrypted and secure; however, no technology is 100% hack-proof. The technology is dependent on a reliable internet connection."         Subjective:     Holly Blair is a 56 year old female who presents on 10/13/2021.    She would like to discuss Ozempic. She was not able to tolerate Metformin. She tried it for over a month but she had bothersome GI symptoms that never improved. Last HgbA1c was 6.4 in July.   She thinks she has  lost weight because her clothes are too big for her. She has increased her walking and is eating healthier. She has increased her vegetable and water intake and is no longer drinking sugar beverages. She has also started counseling.        Objective:    Vitals: LMP 03/31/2017 (Within Days)   Physical Exam  NAD. Mood and affect appropriate        Assessment and Plan:        Diagnoses and all orders for this visit:    Impaired fasting glucose  -     Hemoglobin A1c, Rapid; Future  -     Comprehensive Metabolic Panel; Future      Ozempic would be a good option for her because of the added benefit of weight loss. Will first wait for labs, so I know what diagnosis to associate and the will prescribe.

## 2021-10-19 ENCOUNTER — Encounter (HOSPITAL_BASED_OUTPATIENT_CLINIC_OR_DEPARTMENT_OTHER): Payer: Self-pay | Admitting: Naturopath

## 2021-10-19 NOTE — Telephone Encounter (Signed)
Pt got disconnected in the previous call. Wanted to update her pharmacy:    [facility] [address], [place] [address]

## 2021-10-19 NOTE — Telephone Encounter (Signed)
Patient calling for an update

## 2021-10-20 ENCOUNTER — Telehealth (HOSPITAL_BASED_OUTPATIENT_CLINIC_OR_DEPARTMENT_OTHER): Payer: Self-pay | Admitting: Naturopath

## 2021-10-20 NOTE — Telephone Encounter (Addendum)
LVM for patient about shuttle form from [facility]. Never received form. Asked patient to provide number and clinic will call to request.

## 2021-10-20 NOTE — Telephone Encounter (Signed)
I left a message to patient voicemail to call [employer] to fax the form to [phone].

## 2021-10-20 NOTE — Telephone Encounter (Signed)
[employer] no.  [phone]    Phone no to call patient back: [phone].

## 2021-10-20 NOTE — Telephone Encounter (Signed)
I left a message to patient voicemail to call [facility]

## 2021-10-21 NOTE — Telephone Encounter (Signed)
Was able to speak with [facility]. They will send form out, hopefully today.     Call patient to let her know.

## 2021-10-22 NOTE — Telephone Encounter (Signed)
FORM was received, please check right fax to print, then give to Elan to complete.

## 2021-10-22 NOTE — Telephone Encounter (Signed)
Pt calling to say that she called [employer]. They told her that they have faxed the papers to the clinic and waiting to hear back.

## 2021-10-22 NOTE — Telephone Encounter (Signed)
[facility] [employer] received. Uploaded into media for future in case needed again.

## 2021-10-23 NOTE — Telephone Encounter (Signed)
[place]. Form was given to provider to complete.

## 2021-10-27 NOTE — Telephone Encounter (Signed)
Completed Holly [place] shuttle eligibility form was completed and was fax to [phone].  A copy was scanned  And a copy was sent to patient via mychart.

## 2021-11-03 NOTE — Telephone Encounter (Signed)
Form completed.  See other TE.

## 2021-11-13 ENCOUNTER — Encounter (HOSPITAL_BASED_OUTPATIENT_CLINIC_OR_DEPARTMENT_OTHER): Payer: Self-pay

## 2021-11-20 ENCOUNTER — Emergency Department: Payer: Self-pay

## 2021-12-24 ENCOUNTER — Other Ambulatory Visit (HOSPITAL_BASED_OUTPATIENT_CLINIC_OR_DEPARTMENT_OTHER): Payer: Self-pay | Admitting: Naturopath

## 2021-12-24 DIAGNOSIS — B372 Candidiasis of skin and nail: Secondary | ICD-10-CM

## 2021-12-28 ENCOUNTER — Other Ambulatory Visit (HOSPITAL_BASED_OUTPATIENT_CLINIC_OR_DEPARTMENT_OTHER): Payer: Self-pay | Admitting: Naturopath

## 2021-12-28 DIAGNOSIS — Z1231 Encounter for screening mammogram for malignant neoplasm of breast: Secondary | ICD-10-CM

## 2021-12-28 MED ORDER — NYSTATIN 100000 UNIT/GM EX POWD
CUTANEOUS | 0 refills | Status: DC
Start: 2021-12-28 — End: 2022-02-02

## 2021-12-28 NOTE — Telephone Encounter (Signed)
Patient is due for an appointment

## 2022-01-06 ENCOUNTER — Telehealth (HOSPITAL_BASED_OUTPATIENT_CLINIC_OR_DEPARTMENT_OTHER): Payer: Self-pay | Admitting: Naturopath

## 2022-01-06 NOTE — Telephone Encounter (Signed)
Called patient and left a VM stating we would need to cancel/reschedule their appointment with Dr. [name] on 01/27/2022 to a different time or day.     Provider will be out of office at the scheduled time.

## 2022-01-25 ENCOUNTER — Encounter (HOSPITAL_BASED_OUTPATIENT_CLINIC_OR_DEPARTMENT_OTHER): Payer: Medicare HMO | Admitting: Cardiology

## 2022-01-27 ENCOUNTER — Other Ambulatory Visit: Payer: Self-pay

## 2022-01-27 ENCOUNTER — Ambulatory Visit (HOSPITAL_BASED_OUTPATIENT_CLINIC_OR_DEPARTMENT_OTHER): Payer: Medicare HMO

## 2022-01-27 ENCOUNTER — Ambulatory Visit (HOSPITAL_BASED_OUTPATIENT_CLINIC_OR_DEPARTMENT_OTHER): Payer: Medicare HMO | Admitting: Naturopath

## 2022-02-02 ENCOUNTER — Ambulatory Visit
Admission: RE | Admit: 2022-02-02 | Discharge: 2022-02-02 | Disposition: A | Discharge status: Home / Self Care | Payer: Medicare HMO | Attending: Diagnostic Radiology | Admitting: Diagnostic Radiology

## 2022-02-02 ENCOUNTER — Ambulatory Visit (HOSPITAL_BASED_OUTPATIENT_CLINIC_OR_DEPARTMENT_OTHER): Payer: Medicare HMO | Admitting: Naturopath

## 2022-02-02 ENCOUNTER — Telehealth (HOSPITAL_BASED_OUTPATIENT_CLINIC_OR_DEPARTMENT_OTHER): Payer: Self-pay | Admitting: Pharmacy Technician

## 2022-02-02 ENCOUNTER — Ambulatory Visit (HOSPITAL_COMMUNITY): Payer: Self-pay

## 2022-02-02 ENCOUNTER — Other Ambulatory Visit (HOSPITAL_BASED_OUTPATIENT_CLINIC_OR_DEPARTMENT_OTHER): Payer: Self-pay | Admitting: Naturopath

## 2022-02-02 ENCOUNTER — Encounter (HOSPITAL_BASED_OUTPATIENT_CLINIC_OR_DEPARTMENT_OTHER): Payer: Self-pay

## 2022-02-02 VITALS — BP 137/81 | HR 70 | Temp 99.0°F | Wt 325.0 lb

## 2022-02-02 DIAGNOSIS — E669 Obesity, unspecified: Secondary | ICD-10-CM | POA: Insufficient documentation

## 2022-02-02 DIAGNOSIS — B372 Candidiasis of skin and nail: Secondary | ICD-10-CM

## 2022-02-02 DIAGNOSIS — R7301 Impaired fasting glucose: Secondary | ICD-10-CM | POA: Insufficient documentation

## 2022-02-02 DIAGNOSIS — I110 Hypertensive heart disease with heart failure: Secondary | ICD-10-CM

## 2022-02-02 DIAGNOSIS — E7800 Pure hypercholesterolemia, unspecified: Secondary | ICD-10-CM | POA: Insufficient documentation

## 2022-02-02 DIAGNOSIS — I10 Essential (primary) hypertension: Secondary | ICD-10-CM | POA: Insufficient documentation

## 2022-02-02 DIAGNOSIS — I5032 Chronic diastolic (congestive) heart failure: Secondary | ICD-10-CM

## 2022-02-02 DIAGNOSIS — E1169 Type 2 diabetes mellitus with other specified complication: Secondary | ICD-10-CM | POA: Insufficient documentation

## 2022-02-02 DIAGNOSIS — Z1211 Encounter for screening for malignant neoplasm of colon: Secondary | ICD-10-CM

## 2022-02-02 DIAGNOSIS — Z0000 Encounter for general adult medical examination without abnormal findings: Secondary | ICD-10-CM | POA: Insufficient documentation

## 2022-02-02 DIAGNOSIS — F419 Anxiety disorder, unspecified: Secondary | ICD-10-CM | POA: Insufficient documentation

## 2022-02-02 DIAGNOSIS — Z1231 Encounter for screening mammogram for malignant neoplasm of breast: Secondary | ICD-10-CM | POA: Insufficient documentation

## 2022-02-02 DIAGNOSIS — Z124 Encounter for screening for malignant neoplasm of cervix: Secondary | ICD-10-CM | POA: Insufficient documentation

## 2022-02-02 DIAGNOSIS — Z23 Encounter for immunization: Secondary | ICD-10-CM | POA: Insufficient documentation

## 2022-02-02 DIAGNOSIS — E782 Mixed hyperlipidemia: Secondary | ICD-10-CM | POA: Insufficient documentation

## 2022-02-02 DIAGNOSIS — F331 Major depressive disorder, recurrent, moderate: Secondary | ICD-10-CM

## 2022-02-02 DIAGNOSIS — Z1151 Encounter for screening for human papillomavirus (HPV): Secondary | ICD-10-CM | POA: Insufficient documentation

## 2022-02-02 DIAGNOSIS — N3941 Urge incontinence: Secondary | ICD-10-CM | POA: Insufficient documentation

## 2022-02-02 LAB — COMPREHENSIVE METABOLIC PANEL
ALT (GPT): 20 U/L (ref 7–33)
AST (GOT): 17 U/L (ref 9–38)
Albumin: 4 g/dL (ref 3.5–5.2)
Alkaline Phosphatase (Total): 109 U/L (ref 34–121)
Anion Gap: 9 (ref 4–12)
Bilirubin (Total): 0.7 mg/dL (ref 0.2–1.3)
Calcium: 9.2 mg/dL (ref 8.9–10.2)
Carbon Dioxide, Total: 27 meq/L (ref 22–32)
Chloride: 101 meq/L (ref 98–108)
Creatinine: 0.82 mg/dL (ref 0.38–1.02)
Glucose: 112 mg/dL (ref 62–125)
Potassium: 4.1 meq/L (ref 3.6–5.2)
Protein (Total): 8.2 g/dL (ref 6.0–8.2)
Sodium: 137 meq/L (ref 135–145)
Urea Nitrogen: 12 mg/dL (ref 8–21)
eGFR by CKD-EPI 2021: >60 mL/min/{1.73_m2} (ref >59)

## 2022-02-02 LAB — HEMOGLOBIN A1C, RAPID: Hemoglobin A1C: 6.9 % — ABNORMAL HIGH (ref 4.0–6.0)

## 2022-02-02 MED ORDER — FLUOXETINE HCL 20 MG OR CAPS
60.0000 mg | ORAL_CAPSULE | Freq: Every day | ORAL | 3 refills | Status: DC
Start: 2022-02-02 — End: 2022-02-10

## 2022-02-02 MED ORDER — PNEUMOCOCCAL 20-VAL CONJ VACC 0.5 ML IM SUSY
0.5000 mL | PREFILLED_SYRINGE | Freq: Once | INTRAMUSCULAR | Status: AC
Start: 2022-02-02 — End: 2022-02-02
  Administered 2022-02-02: 0.5 mL via INTRAMUSCULAR

## 2022-02-02 MED ORDER — PNEUMOCOCCAL 20-VAL CONJ VACC 0.5 ML IM SUSY
PREFILLED_SYRINGE | INTRAMUSCULAR | Status: AC
Start: 2022-02-02 — End: 2022-02-02
  Filled 2022-02-02: qty 0.5

## 2022-02-02 MED ORDER — ATORVASTATIN CALCIUM 40 MG OR TABS
40.0000 mg | ORAL_TABLET | Freq: Every day | ORAL | 3 refills | Status: DC
Start: 2022-02-02 — End: 2022-02-10

## 2022-02-02 MED ORDER — BUPROPION HCL ER (XL) 150 MG OR TB24
150.0000 mg | EXTENDED_RELEASE_TABLET | Freq: Every day | ORAL | 3 refills | Status: DC
Start: 2022-02-02 — End: 2022-02-10

## 2022-02-02 MED ORDER — FUROSEMIDE 20 MG OR TABS
20.0000 mg | ORAL_TABLET | Freq: Every day | ORAL | 3 refills | Status: DC
Start: 2022-02-02 — End: 2022-02-10

## 2022-02-02 MED ORDER — NYSTATIN 100000 UNIT/GM EX POWD
CUTANEOUS | 0 refills | Status: DC
Start: 2022-02-02 — End: 2022-02-10

## 2022-02-02 MED ORDER — AMLODIPINE BESYLATE 10 MG OR TABS
10.0000 mg | ORAL_TABLET | Freq: Every day | ORAL | 3 refills | Status: DC
Start: 2022-02-02 — End: 2022-02-10

## 2022-02-02 MED ORDER — OXYBUTYNIN CHLORIDE 5 MG OR TABS
5.0000 mg | ORAL_TABLET | Freq: Two times a day (BID) | ORAL | 12 refills | Status: DC
Start: 2022-02-02 — End: 2022-02-10

## 2022-02-02 MED ORDER — WEGOVY 0.25 MG/0.5ML SC SOAJ
0.2500 mg | SUBCUTANEOUS | 6 refills | Status: DC
Start: 2022-02-02 — End: 2022-02-06

## 2022-02-02 MED ORDER — CLOTRIMAZOLE 1 % EX CREA
TOPICAL_CREAM | Freq: Two times a day (BID) | CUTANEOUS | 2 refills | Status: DC
Start: 2022-02-02 — End: 2022-02-10

## 2022-02-02 MED ORDER — BUPROPION HCL ER (XL) 300 MG OR TB24
300.0000 mg | EXTENDED_RELEASE_TABLET | Freq: Every day | ORAL | 3 refills | Status: DC
Start: 2022-02-02 — End: 2022-02-10
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
## 2022-02-02 NOTE — Telephone Encounter (Signed)
Wegovy 0.25 mg/0.61m Auto injectors need pA  [facility]

## 2022-02-05 ENCOUNTER — Other Ambulatory Visit (HOSPITAL_BASED_OUTPATIENT_CLINIC_OR_DEPARTMENT_OTHER): Payer: Self-pay

## 2022-02-05 LAB — HPV REFLEX TO PAP
HPV 16 Genotype: NEGATIVE
HPV 18 Genotype: NEGATIVE
High Risk HPV Screening: NEGATIVE
Other High Risk HPV Genotype: NEGATIVE

## 2022-02-05 NOTE — Telephone Encounter (Signed)
Mancel Parsons is a benefit exclusion on the patients' insurance plan ([employer]) and cannot be covered under the plan.    Ozempic is  FDA approved for type DM 2    Mancel Parsons is FDA approved for weight loss.    Please consider changing to [place] [phone] [place] [phone]

## 2022-02-06 MED ORDER — OZEMPIC (0.25 OR 0.5 MG/DOSE) 2 MG/3ML SC SOPN
0.2500 mg | PEN_INJECTOR | SUBCUTANEOUS | 1 refills | Status: DC
Start: 2022-02-06 — End: 2022-02-10

## 2022-02-06 NOTE — Addendum Note (Signed)
Addended by: Eldred Manges on: 02/06/2022 05:51 AM     Modules accepted: Orders

## 2022-02-10 ENCOUNTER — Telehealth (HOSPITAL_BASED_OUTPATIENT_CLINIC_OR_DEPARTMENT_OTHER): Payer: Self-pay | Admitting: Naturopath

## 2022-02-10 DIAGNOSIS — F419 Anxiety disorder, unspecified: Secondary | ICD-10-CM

## 2022-02-10 DIAGNOSIS — E7800 Pure hypercholesterolemia, unspecified: Secondary | ICD-10-CM

## 2022-02-10 DIAGNOSIS — F331 Major depressive disorder, recurrent, moderate: Secondary | ICD-10-CM

## 2022-02-10 DIAGNOSIS — N3941 Urge incontinence: Secondary | ICD-10-CM

## 2022-02-10 DIAGNOSIS — E669 Obesity, unspecified: Secondary | ICD-10-CM

## 2022-02-10 DIAGNOSIS — I5032 Chronic diastolic (congestive) heart failure: Secondary | ICD-10-CM

## 2022-02-10 DIAGNOSIS — B372 Candidiasis of skin and nail: Secondary | ICD-10-CM

## 2022-02-10 DIAGNOSIS — I10 Essential (primary) hypertension: Secondary | ICD-10-CM

## 2022-02-10 MED ORDER — OXYBUTYNIN CHLORIDE 5 MG OR TABS
5.0000 mg | ORAL_TABLET | Freq: Two times a day (BID) | ORAL | 12 refills | Status: DC
Start: 2022-02-10 — End: 2022-03-30

## 2022-02-10 MED ORDER — BUPROPION HCL ER (XL) 300 MG OR TB24
300.0000 mg | EXTENDED_RELEASE_TABLET | Freq: Every day | ORAL | 3 refills | Status: DC
Start: 2022-02-10 — End: 2022-03-30

## 2022-02-10 MED ORDER — FLUOXETINE HCL 20 MG OR CAPS
60.0000 mg | ORAL_CAPSULE | Freq: Every day | ORAL | 3 refills | Status: DC
Start: 2022-02-10 — End: 2022-03-30

## 2022-02-10 MED ORDER — OZEMPIC (0.25 OR 0.5 MG/DOSE) 2 MG/3ML SC SOPN
0.2500 mg | PEN_INJECTOR | SUBCUTANEOUS | 1 refills | Status: DC
Start: 2022-02-10 — End: 2022-03-30

## 2022-02-10 MED ORDER — BUPROPION HCL ER (XL) 150 MG OR TB24
150.0000 mg | EXTENDED_RELEASE_TABLET | Freq: Every day | ORAL | 3 refills | Status: DC
Start: 2022-02-10 — End: 2022-03-30

## 2022-02-10 MED ORDER — AMLODIPINE BESYLATE 10 MG OR TABS
10.0000 mg | ORAL_TABLET | Freq: Every day | ORAL | 3 refills | Status: DC
Start: 2022-02-10 — End: 2022-03-30

## 2022-02-10 MED ORDER — FUROSEMIDE 20 MG OR TABS
20.0000 mg | ORAL_TABLET | Freq: Every day | ORAL | 3 refills | Status: DC
Start: 2022-02-10 — End: 2022-03-30

## 2022-02-10 MED ORDER — ATORVASTATIN CALCIUM 40 MG OR TABS
40.0000 mg | ORAL_TABLET | Freq: Every day | ORAL | 3 refills | Status: DC
Start: 2022-02-10 — End: 2022-03-30

## 2022-02-10 MED ORDER — NYSTATIN 100000 UNIT/GM EX POWD
CUTANEOUS | 0 refills | Status: DC
Start: 2022-02-10 — End: 2022-03-30

## 2022-02-10 MED ORDER — CLOTRIMAZOLE 1 % EX CREA
TOPICAL_CREAM | Freq: Two times a day (BID) | CUTANEOUS | 2 refills | Status: DC
Start: 2022-02-10 — End: 2022-03-30

## 2022-02-10 NOTE — Telephone Encounter (Signed)
Pt calling to say that prescriptions had been sent to wrong [place]. Please resend it to correct pharmacy:    [facility]  [address] [place], [place] [address]

## 2022-02-15 ENCOUNTER — Ambulatory Visit: Payer: Medicare HMO | Attending: Naturopath

## 2022-02-15 DIAGNOSIS — Z1211 Encounter for screening for malignant neoplasm of colon: Secondary | ICD-10-CM | POA: Insufficient documentation

## 2022-02-16 LAB — OCCULT BLOOD BY IA, STL: Occult Bld 1 Result: NEGATIVE

## 2022-03-29 ENCOUNTER — Telehealth (HOSPITAL_BASED_OUTPATIENT_CLINIC_OR_DEPARTMENT_OTHER): Payer: Self-pay | Admitting: Naturopath

## 2022-03-29 DIAGNOSIS — I10 Essential (primary) hypertension: Secondary | ICD-10-CM

## 2022-03-29 DIAGNOSIS — F331 Major depressive disorder, recurrent, moderate: Secondary | ICD-10-CM

## 2022-03-29 DIAGNOSIS — B372 Candidiasis of skin and nail: Secondary | ICD-10-CM

## 2022-03-29 DIAGNOSIS — N3941 Urge incontinence: Secondary | ICD-10-CM

## 2022-03-29 DIAGNOSIS — E669 Obesity, unspecified: Secondary | ICD-10-CM

## 2022-03-29 DIAGNOSIS — E7800 Pure hypercholesterolemia, unspecified: Secondary | ICD-10-CM

## 2022-03-29 DIAGNOSIS — I5032 Chronic diastolic (congestive) heart failure: Secondary | ICD-10-CM

## 2022-03-29 DIAGNOSIS — F419 Anxiety disorder, unspecified: Secondary | ICD-10-CM

## 2022-03-29 NOTE — Telephone Encounter (Signed)
Pt was seen recently for her annual exam, and her medications were re-ordered to the [facility] in [place] on 02/10/22.    Left a voicemail to call back at [phone], option 2.

## 2022-03-29 NOTE — Telephone Encounter (Signed)
Pt calling to request refill prescription for her diabetes medication as well as all other medications.    Pharmacy    [facility] [phone] [place]

## 2022-03-30 NOTE — Telephone Encounter (Signed)
Pt calls back to let us know that [facility] cancelled all of the prescriptions ordered at the annual visit.     Pended re-order.

## 2022-03-31 MED ORDER — ATORVASTATIN CALCIUM 40 MG OR TABS
40.0000 mg | ORAL_TABLET | Freq: Every day | ORAL | 3 refills | Status: DC
Start: 2022-03-31 — End: 2023-03-22

## 2022-03-31 MED ORDER — BUPROPION HCL ER (XL) 300 MG OR TB24
300.0000 mg | EXTENDED_RELEASE_TABLET | Freq: Every day | ORAL | 3 refills | Status: DC
Start: 2022-03-31 — End: 2023-03-22

## 2022-03-31 MED ORDER — BUPROPION HCL ER (XL) 150 MG OR TB24
150.0000 mg | EXTENDED_RELEASE_TABLET | Freq: Every day | ORAL | 3 refills | Status: DC
Start: 2022-03-31 — End: 2023-03-22

## 2022-03-31 MED ORDER — OZEMPIC (0.25 OR 0.5 MG/DOSE) 2 MG/3ML SC SOPN
0.2500 mg | PEN_INJECTOR | SUBCUTANEOUS | 1 refills | Status: DC
Start: 2022-03-31 — End: 2022-11-16

## 2022-03-31 MED ORDER — FUROSEMIDE 20 MG OR TABS
20.0000 mg | ORAL_TABLET | Freq: Every day | ORAL | 3 refills | Status: DC
Start: 2022-03-31 — End: 2023-03-22

## 2022-03-31 MED ORDER — AMLODIPINE BESYLATE 10 MG OR TABS
10.0000 mg | ORAL_TABLET | Freq: Every day | ORAL | 3 refills | Status: DC
Start: 2022-03-31 — End: 2023-03-22

## 2022-03-31 MED ORDER — OXYBUTYNIN CHLORIDE 5 MG OR TABS
5.0000 mg | ORAL_TABLET | Freq: Two times a day (BID) | ORAL | 12 refills | Status: DC
Start: 2022-03-31 — End: 2022-09-14

## 2022-03-31 MED ORDER — FLUOXETINE HCL 20 MG OR CAPS
60.0000 mg | ORAL_CAPSULE | Freq: Every day | ORAL | 3 refills | Status: DC
Start: 2022-03-31 — End: 2023-03-22

## 2022-03-31 MED ORDER — NYSTATIN 100000 UNIT/GM EX POWD
CUTANEOUS | 0 refills | Status: DC
Start: 2022-03-31 — End: 2023-04-26

## 2022-03-31 MED ORDER — CLOTRIMAZOLE 1 % EX CREA
TOPICAL_CREAM | Freq: Two times a day (BID) | CUTANEOUS | 2 refills | Status: AC
Start: 2022-03-31 — End: ?

## 2022-04-01 ENCOUNTER — Telehealth (HOSPITAL_BASED_OUTPATIENT_CLINIC_OR_DEPARTMENT_OTHER): Payer: Self-pay | Admitting: Naturopath

## 2022-04-01 DIAGNOSIS — E1169 Type 2 diabetes mellitus with other specified complication: Secondary | ICD-10-CM

## 2022-04-01 NOTE — Telephone Encounter (Signed)
Pt calling to know if she needs a prescription to get a sharpie container disposal to put her diabetes needles after use.

## 2022-04-02 NOTE — Telephone Encounter (Signed)
Sharps container ordered but I think it might need to be printed off and faxed to pharmacy

## 2022-04-07 ENCOUNTER — Other Ambulatory Visit: Payer: Self-pay

## 2022-05-03 ENCOUNTER — Emergency Department: Payer: Self-pay

## 2022-05-17 DIAGNOSIS — G4733 Obstructive sleep apnea (adult) (pediatric): Secondary | ICD-10-CM | POA: Insufficient documentation

## 2022-05-17 NOTE — Patient Instructions (Signed)
Medication changes during this visit: No changes    Return to our Advanced Heart Failure Clinic in 6 months    Please stop by the Outpatient Blood Draw lab before every cardiology clinic appointment for blood work.    Please bring your medication bottles and medication list with you to every visit.    Take your medications as prescribed and let us know if you feel that the medications are causing any side effect. If you miss a dose and it's almost time for your next dose, just wait and take your next dose at the normal time. Don't take a double dose.    Please avoid salt intake and follow healthy life style. Limit canned, dried, packaged, and fast foods because that type of food is rich in salt.Please check your blood pressure, heart rate and  weight daily  at home and keep records of it. Bring your log with you to cardiology clinic appointment.    Weigh yourself when you wake up, after going to bathroom. Do not eat, drink, or get dressed prior to weighing yourself. Call our clinic if your weight gain is 2 pounds in one day or 5 pounds in a week        For any other questions or concerns, please call my nurse, Michelle Dang, at [phone].    For appointments or rescheduling, please call [phone] option [phone].    The fax number for the clinic [phone].

## 2022-05-17 NOTE — Progress Notes (Signed)
Advanced Heart Failure and Transplant Cardiology Return Patient Note    Patient: Holly Blair   Age:56 year old   MRN: [id number]   PCP: Eldred Manges, ARNP       Chief Complaint      HFpEF    History of Present Illness      Holly Blair is a 56 year old  year-old woman who returns for follow up of HFpEF, mild AS, OSA, obesity, PH, HTN and HLD.     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  Systemic hypertension  Hyperlipidemia    Patient Active Problem List   Diagnosis    Depression, major, recurrent (HCC)    Chronic knee pain    Chronic back pain    Senile nuclear cataract    Myopia with astigmatism and presbyopia    Morbid obesity with BMI of 50.0-59.9, adult (HCC)    Hyperlipidemia    Systolic murmur    Anxiety    Rosacea    Essential hypertension    Pulmonary HTN (HCC)    Prediabetes    Heart failure with preserved ejection fraction (HCC)       Medications     Current Outpatient Medications   Medication Instructions    acetaminophen (TYLENOL) 650 mg, Oral, Every 4 hours PRN    amLODIPine (NORVASC) 10 mg, Oral, Daily    atorvastatin (LIPITOR) 40 mg, Oral, Daily    buPROPion XL (WELLBUTRIN XL) 150 mg, Oral, Daily, In addition to the 300 mg dose.    buPROPion XL (WELLBUTRIN XL) 300 mg, Oral, Daily, Take in addition to 150 mg dose.    clotrimazole 1 % cream Topical, 2 times daily, For fungal rash.    FLUoxetine (PROZAC) 60 mg, Oral, Daily    furosemide (LASIX) 20 mg, Oral, Daily    metroNIDAZOLE 0.75 % topical gel Topical, 2 times daily    mupirocin 2 % ointment Apply to open lesions on lower legs and ankles three times daily    nystatin (Nystop) 100000 UNIT/GM powder APPLY TO THE AFFECTED AREA TWICE DAILY    oxyBUTYnin (DITROPAN) 5 mg, Oral, 2 times daily, Take up to 3 times a day    Ozempic 0.25 mg,  Subcutaneous, Weekly, In 4 weeks increase to 0.5 mg.    triamcinolone 0.1 % cream Leg(s), 2 times daily         Allergies     Review of patient's allergies indicates:  No Known Allergies       Physical Examination                                                       Vitals:There were no vitals filed for this visit.     (no weight taken for this visit)   There is no height or weight on file to calculate BMI.       General:  Well-nourished adult in no distress    Eye:  Pupils are equal, round and reactive to light, Extraocular movements are intact, Normal conjunctiva.    HENT:  Normal.  Neck:  Supple   Respiratory:  Lungs are clear to auscultation, No chest wall tenderness. Chest wall appearance normal.  Cardiovascular:  JVP ***cms, regular rate and rhythm, S1, S2, *** S3, *** S4, Murmurs: ***. *** lifts, heaves, or thrills. PMI ***, palpable 5th intercostal space mid clavicular line. Sternotomy ***    Vascular: Carotids normal with no bruits, peripheral pulses palpable and full.   Gastrointestinal:  Soft, Non-tender, No organomegaly. Normal bowel sounds with no abdominal bruits    Genitourinary:  No costovertebral angle tenderness  Lymphatics:  *** lower extremity edema.    Musculoskeletal:  Normal range of motion.  No swelling.  Integumentary:  Warm, Dry.  No rashes or skin changes  Neurologic:  Alert, Oriented, No focal defects.       Database     Labs      No results found for any visits on 05/20/22.      TTE (date: 11/20/18)  1. Normal LV size and systolic function  2. Evidence of diastolic compliance abnormality  3. Mild aortic stenosis  4. Mitral annular calciafication with 4 mm gradient  E/e' is increased and indicative of increased filling pressures.     TTE (date: 08/17/18)  Normal LV size and systolic function (EF 33%). No regional wall motion abnormalities.  Normal RV size and function.   Trace to mild aortic insufficiency. Mild-moderate aortic stenosis. Otherwise normal valvular function.   Mild  pulmonary hypertension. Estimated PASP 39-90mHg based on an estimated right atrial pressure of 5-176mg.  No pericardial effusion.      TTE (date: 07/24/2019):  1. 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 There is associated mild regurgitation. Guidelines suggest repeat echo in 3-5 years.   2. Normal left ventricular chamber size with hyperdynamic systolic function (EF 7582% No regional wall motion abnormalities seen. Impaired LV diastolic relaxation with likely normal filling pressures.  3. here is mild mitral annular calcification. Otherwise normal valvular anatomy and function.   4. Right ventricle not well visualized but probably normal size and function.   5. Mild pulmonary hypertension (PASP 36-41 mmHg) with mild central venous pressure (5-10 mmHg).  6. No pericardial effusion.  Compared to prior study, no major changes.      Other Imaging:   CT Chest PE Protocol (date: 11/19/18)  No evidence of pulmonary embolism.  -Mildly enlarged bilateral axillary lymph nodes, indeterminate in etiology. Consider further investigation if clinically indicated.  -Dilated main pulmonary artery and reversal of main pulmonary artery to aorta ratio indicates presence of pulmonary hypertension.  -Combination of groundglass opacity, minimal septal thickening, and bilateral pleural effusions represents presence of pulmonary edema.       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  The impression and plan above was dictated or typed by me and  was directed by my personal evaluation of  this patient.     I spent a total of 30 minutes for the patient's care on the date of the service.  This includes the time reviewing the chart, time with the patient in the exam room,  and time completing the note on the date of service.       Signature Line    [place] V. Tajae Rybicki MD [facility]  Professor of Medicine  Advanced Heart Failure and Transplant Cardiology

## 2022-05-20 ENCOUNTER — Ambulatory Visit (HOSPITAL_BASED_OUTPATIENT_CLINIC_OR_DEPARTMENT_OTHER): Payer: Medicare HMO | Admitting: Cardiovascular Disease

## 2022-05-20 DIAGNOSIS — I10 Essential (primary) hypertension: Secondary | ICD-10-CM

## 2022-05-20 DIAGNOSIS — I5032 Chronic diastolic (congestive) heart failure: Secondary | ICD-10-CM

## 2022-05-20 DIAGNOSIS — G4733 Obstructive sleep apnea (adult) (pediatric): Secondary | ICD-10-CM

## 2022-05-20 DIAGNOSIS — E782 Mixed hyperlipidemia: Secondary | ICD-10-CM

## 2022-05-27 ENCOUNTER — Telehealth (HOSPITAL_BASED_OUTPATIENT_CLINIC_OR_DEPARTMENT_OTHER): Payer: Self-pay | Admitting: Cardiovascular Disease

## 2022-05-27 NOTE — Telephone Encounter (Signed)
Left voicemail for patient to call back to reschedule appt with Dr. Lorinda Creed on 1/17 from 2:30 start time to 2PM start time.    Sending MyChart/letter requesting patient call us back to discuss.

## 2022-06-30 ENCOUNTER — Ambulatory Visit (HOSPITAL_BASED_OUTPATIENT_CLINIC_OR_DEPARTMENT_OTHER): Payer: Medicare HMO | Admitting: Cardiovascular Disease

## 2022-07-05 DIAGNOSIS — I350 Nonrheumatic aortic (valve) stenosis: Secondary | ICD-10-CM | POA: Insufficient documentation

## 2022-07-05 NOTE — Patient Instructions (Signed)
Medication changes during this visit: No changes    Return to our Advanced Heart Failure Clinic in 6 months    Please stop by the Outpatient Blood Draw lab before every cardiology clinic appointment for blood work.    Please bring your medication bottles and medication list with you to every visit.    Take your medications as prescribed and let us know if you feel that the medications are causing any side effect. If you miss a dose and it's almost time for your next dose, just wait and take your next dose at the normal time. Don't take a double dose.    Please avoid salt intake and follow healthy life style. Limit canned, dried, packaged, and fast foods because that type of food is rich in salt.Please check your blood pressure, heart rate and  weight daily  at home and keep records of it. Bring your log with you to cardiology clinic appointment.    Weigh yourself when you wake up, after going to bathroom. Do not eat, drink, or get dressed prior to weighing yourself. Call our clinic if your weight gain is 2 pounds in one day or 5 pounds in a week        For any other questions or concerns, please call my nurse, Michelle Dang, at [phone].    For appointments or rescheduling, please call [phone] option [phone].    The fax number for the clinic [phone].

## 2022-07-05 NOTE — Progress Notes (Signed)
Advanced Heart Failure and Transplant Cardiology Return Patient Note    Patient: Holly Blair   Age:57 year old   MRN: [id number]   PCP: Susa Loffler, ARNP       Chief Complaint      HFpEF    History of Present Illness      Holly Blair is a 57 year old  year-old woman who returns for follow up of HFpEF, mild AS, OSA, obesity, PH, HTN and HLD.     Previously followed by Dr Kellie Moor who has departed [place]    05/20/22: NOS due to medical transportation issue    07/06/22: She notices that she has difficulty keeping up pace with other people or going up hills. On flat ground she does better and uses a walker to ambulate at baseline. She does water aerobics at the [facility]. She denies dizziness, presyncope or syncope.  She spoke at length about the passing of her fiance, as this is the 2 year anniversary. She is counseling to deal with the grief. 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    Past Medical/Surgical History     HFpEF; TTE 2/21: LVEF 75% with impaired LV diastolic relaxation  Mild aortic stenosis  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  Systemic hypertension on amlodipine  Hyperlipidemia on atorvastatin    Patient Active Problem List   Diagnosis    Depression, major, recurrent (HCC)    Chronic knee pain    Chronic back pain    Senile nuclear cataract    Myopia with astigmatism and presbyopia    Morbid obesity with BMI of 50.0-59.9, adult (HCC)    Hyperlipidemia    Systolic murmur    Anxiety    Rosacea    Essential hypertension    Pulmonary HTN (HCC)    Prediabetes    Heart failure with preserved ejection fraction (HCC)    OSA (obstructive sleep apnea)    Mild aortic stenosis       Medications     Current Outpatient Medications   Medication Instructions    amLODIPine (NORVASC) 10 mg, Oral, Daily    atorvastatin (LIPITOR) 40 mg, Oral, Daily    buPROPion XL (WELLBUTRIN XL) 150 mg, Oral, Daily, In addition to the 300 mg dose.    buPROPion XL (WELLBUTRIN XL)  300 mg, Oral, Daily, Take in addition to 150 mg dose.    clotrimazole 1 % cream Topical, 2 times daily, For fungal rash.    FLUoxetine (PROZAC) 60 mg, Oral, Daily    furosemide (LASIX) 20 mg, Oral, Daily    metroNIDAZOLE 0.75 % topical gel Topical, 2 times daily    mupirocin 2 % ointment Apply to open lesions on lower legs and ankles three times daily    nystatin (Nystop) 100000 UNIT/GM powder APPLY TO THE AFFECTED AREA TWICE DAILY    oxyBUTYnin (DITROPAN) 5 mg, Oral, 2 times daily, Take up to 3 times a day    Ozempic 0.25 mg, Subcutaneous, Weekly, In 4 weeks increase to 0.5 mg.    triamcinolone 0.1 % cream Leg(s), 2 times daily         Allergies     Review of patient's allergies indicates:  No Known Allergies       Physical Examination  Vitals:  Vitals:    07/07/22 1338   BP: 135/75   Pulse: 67   Temp: 36.4 C   SpO2: 97%        Wt 329 lb 6.4 oz (149.415 kg)   Body mass index is 56.54 kg/m.       General:  Well-nourished adult in no distress    Eye:  Pupils are equal, round and reactive to light, Extraocular movements are intact, Normal conjunctiva.    HENT:  Normal.    Neck:  Supple   Respiratory:  Lungs are clear to auscultation, No chest wall tenderness. Chest wall appearance normal.  Cardiovascular:  JVP not elevated, regular rate and rhythm, S1, S2, no S3, no S4, Murmurs: 2/6 SEM upper LSB with no radiation.    Vascular: Carotids normal with no bruits  Gastrointestinal:  Soft, Non-tender, No organomegaly.   Lymphatics:  1+ non pitting lower extremity edema.    Musculoskeletal:  Normal range of motion.  No swelling.  Integumentary:  Warm, Dry.  No rashes or skin changes  Neurologic:  Alert, Oriented, No focal defects.       Database     Labs  Pending    No results found for any visits on 07/07/22.      TTE (date: 11/20/18)  1. Normal LV size and systolic function  2. Evidence of diastolic compliance abnormality  3. Mild aortic stenosis  4. Mitral annular  calciafication with 4 mm gradient  E/e' is increased and indicative of increased filling pressures.     TTE (date: 08/17/18)  Normal LV size and systolic function (EF 65%). No regional wall motion abnormalities.  Normal RV size and function.   Trace to mild aortic insufficiency. Mild-moderate aortic stenosis. Otherwise normal valvular function.   Mild pulmonary hypertension. Estimated PASP 39-9mmHg based on an estimated right atrial pressure of 5-54mmHg.  No pericardial effusion.      TTE (date: 07/24/2019):  1. 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 There is associated mild regurgitation. Guidelines suggest repeat echo in 3-5 years.   2. Normal left ventricular chamber size with hyperdynamic systolic function (EF 75%). No regional wall motion abnormalities seen. Impaired LV diastolic relaxation with likely normal filling pressures.  3. here is mild mitral annular calcification. Otherwise normal valvular anatomy and function.   4. Right ventricle not well visualized but probably normal size and function.   5. Mild pulmonary hypertension (PASP 36-41 mmHg) with mild central venous pressure (5-10 mmHg).  6. No pericardial effusion.  Compared to prior study, no major changes.      Other Imaging:   CT Chest PE Protocol (date: 11/19/18)  No evidence of pulmonary embolism.  -Mildly enlarged bilateral axillary lymph nodes, indeterminate in etiology. Consider further investigation if clinically indicated.  -Dilated main pulmonary artery and reversal of main pulmonary artery to aorta ratio indicates presence of pulmonary hypertension.  -Combination of groundglass opacity, minimal septal thickening, and bilateral pleural effusions represents presence of pulmonary edema.       Impression and Plan       Impression  HFpEF; TTE 2/21: LVEF 75% with impaired LV diastolic relaxation  NYHA Class II  Euvolemic on exam  Mild aortic stenosis  BMI 56  Pulmonary  hypertension WHO group III  Systemic hypertension, controlled  Hyperlipidemia        Plan:  Schedule TTE to follow up mild aortic stenosis on previous study. She can  do this in the next few weeks.  She will get labs on the way out.  Return to this clinic in 6 months     CC:  PCP: Susa Loffler, ARNP       I personally interviewed and examined this patient during this visit and confirmed all of the information noted.  The impression and plan above was dictated or typed by me and was directed by my personal evaluation of this patient.     I spent a total of 30 minutes for the patient's care on the date of the service.  This includes the time reviewing the chart, time with the patient in the exam room,  and time completing the note on the date of service.       Signature Line    Cesar Chavez V. Patrcia Schnepp MD [facility]  Professor of Medicine  Advanced Heart Failure and Transplant Cardiology

## 2022-07-07 ENCOUNTER — Ambulatory Visit: Payer: Medicare HMO | Attending: Cardiovascular Disease | Admitting: Cardiovascular Disease

## 2022-07-07 VITALS — BP 135/75 | HR 67 | Temp 97.5°F | Ht 64.0 in | Wt 329.4 lb

## 2022-07-07 DIAGNOSIS — I5032 Chronic diastolic (congestive) heart failure: Secondary | ICD-10-CM | POA: Insufficient documentation

## 2022-07-07 DIAGNOSIS — E782 Mixed hyperlipidemia: Secondary | ICD-10-CM | POA: Insufficient documentation

## 2022-07-07 DIAGNOSIS — I10 Essential (primary) hypertension: Secondary | ICD-10-CM | POA: Insufficient documentation

## 2022-07-07 DIAGNOSIS — I110 Hypertensive heart disease with heart failure: Secondary | ICD-10-CM

## 2022-07-07 DIAGNOSIS — I2722 Pulmonary hypertension due to left heart disease: Secondary | ICD-10-CM

## 2022-07-07 DIAGNOSIS — I272 Pulmonary hypertension, unspecified: Secondary | ICD-10-CM | POA: Insufficient documentation

## 2022-07-07 DIAGNOSIS — I350 Nonrheumatic aortic (valve) stenosis: Secondary | ICD-10-CM | POA: Insufficient documentation

## 2022-07-07 LAB — CBC, DIFF
% Basophils: 0 %
% Eosinophils: 2 %
% Immature Granulocytes: 0 %
% Lymphocytes: 35 %
% Monocytes: 5 %
% Neutrophils: 58 %
% Nucleated RBC: 0 %
Absolute Eosinophil Count: 0.19 10*3/uL (ref 0.00–0.50)
Absolute Lymphocyte Count: 4.4 10*3/uL (ref 1.00–4.80)
Basophils: 0.04 10*3/uL (ref 0.00–0.20)
Hematocrit: 41 % (ref 36.0–45.0)
Hemoglobin: 13 g/dL (ref 11.5–15.5)
Immature Granulocytes: 0.05 10*3/uL (ref 0.00–0.05)
MCH: 29.3 pg (ref 27.3–33.6)
MCHC: 31.9 g/dL — ABNORMAL LOW (ref 32.2–36.5)
MCV: 92 fL (ref 81–98)
Monocytes: 0.6 10*3/uL (ref 0.00–0.80)
Neutrophils: 7.18 10*3/uL — ABNORMAL HIGH (ref 1.80–7.00)
Nucleated RBC: 0 10*3/uL
Platelet Count: 260 10*3/uL (ref 150–400)
RBC: 4.44 10*6/uL (ref 3.80–5.00)
RDW-CV: 14.9 % — ABNORMAL HIGH (ref 11.0–14.5)
WBC: 12.46 10*3/uL — ABNORMAL HIGH (ref 4.3–10.0)

## 2022-07-07 LAB — B_TYPE NATRIURETIC PEPTIDE: B_Type Natriuretic Peptide: 56 pg/mL (ref <101)

## 2022-07-07 LAB — COMPREHENSIVE METABOLIC PANEL
ALT (GPT): 16 U/L (ref 7–33)
AST (GOT): 13 U/L (ref 9–38)
Albumin: 4 g/dL (ref 3.5–5.2)
Alkaline Phosphatase (Total): 99 U/L (ref 34–121)
Anion Gap: 7 (ref 4–12)
Bilirubin (Total): 0.5 mg/dL (ref 0.2–1.3)
Calcium: 9.2 mg/dL (ref 8.9–10.2)
Carbon Dioxide, Total: 29 meq/L (ref 22–32)
Chloride: 102 meq/L (ref 98–108)
Creatinine: 0.8 mg/dL (ref 0.38–1.02)
Glucose: 142 mg/dL — ABNORMAL HIGH (ref 62–125)
Potassium: 3.9 meq/L (ref 3.6–5.2)
Protein (Total): 8.4 g/dL — ABNORMAL HIGH (ref 6.0–8.2)
Sodium: 138 meq/L (ref 135–145)
Urea Nitrogen: 14 mg/dL (ref 8–21)
eGFR by CKD-EPI 2021: >60 mL/min/{1.73_m2} (ref >59)

## 2022-07-07 LAB — MAGNESIUM: Magnesium: 2.1 mg/dL (ref 1.8–2.4)

## 2022-07-22 ENCOUNTER — Telehealth (HOSPITAL_BASED_OUTPATIENT_CLINIC_OR_DEPARTMENT_OTHER): Payer: Self-pay | Admitting: Naturopath

## 2022-07-22 DIAGNOSIS — N3281 Overactive bladder: Secondary | ICD-10-CM

## 2022-07-22 NOTE — Telephone Encounter (Signed)
Pt states that she would like Referral for bladder specialist    Urine is smelling very strong  No pain urination    No vagina irritation or discharge   No fevers or back pain.

## 2022-07-24 NOTE — Telephone Encounter (Signed)
Referral completed.  Please notify patient.

## 2022-09-14 ENCOUNTER — Encounter (INDEPENDENT_AMBULATORY_CARE_PROVIDER_SITE_OTHER): Payer: Self-pay | Admitting: Obstetrics & Gynecology

## 2022-09-14 ENCOUNTER — Ambulatory Visit: Payer: Medicare HMO | Attending: Naturopath | Admitting: Obstetrics & Gynecology

## 2022-09-14 VITALS — BP 145/84 | HR 66 | Temp 97.3°F | Ht 64.0 in | Wt 329.0 lb

## 2022-09-14 DIAGNOSIS — N3281 Overactive bladder: Secondary | ICD-10-CM

## 2022-09-14 MED ORDER — ESTRADIOL 0.1 MG/GM VA CREA
1.0000 g | TOPICAL_CREAM | VAGINAL | 3 refills | Status: DC
Start: 2022-09-16 — End: 2023-04-26

## 2022-09-14 MED ORDER — MIRABEGRON ER 50 MG OR TB24
50.0000 mg | EXTENDED_RELEASE_TABLET | Freq: Every day | ORAL | 3 refills | Status: DC
Start: 2022-09-14 — End: 2023-11-17

## 2022-09-14 NOTE — Progress Notes (Signed)
HPI: Holly Blair is a 57 year old P4 postmenopausal female who presents today to the [facility] on 09/14/2022 for consultation regarding urinary incontinence. She presents with her care giver today, Juliette Alcide.    Chief complaint: "Bladder leakage"    Urinary:    Has had urinary incontinence since 2018.  She saw my partner at that time who recommended behavioral modifications and oxybutynin.  Pelvic floor physical therapy was also discussed but patient has limited ability to attend appointments.      Today she notes significant worsening of her bladder symptoms since we last saw her.  She is currently taking oxybutynin 5mg  BID and has had dry mouth. Also with SUI, but UUI more bothersome.  Denies dysuria. Soaking through pads and clothes multiple times a day.  She has frequency and voids hourly at least.    # Pads/day: Heavy pads 7 times a day    # Daytime voids: Every hour at least  # Nighttime voids: 2 times  Fluid intake: 1 cup coffee, 2 cups soda, 2 cups of juice, 1 gallon water    UTIs: none    Prolapse:  No symptoms    Bowels:  No constipation. No FI.   Regular bowel movements.    Sexual function:  Not currently sexually active    PFDI-20 Scores: Scanned    GYN History:  Age at menopause: 18s  Current HRT: None  Last pap: 2023  Hx abnormal pap smears: No    OB History:   [id number]   NSVD x 4  History of 3rd or 4th degree perineal lacerations: Yes  Largest baby: 9 pounds 3 ounces    Past Medical History:   Diagnosis Date    Acne     Anxiety state, unspecified     Arthritis     Cataract     Chronic back pain     Chronic knee pain     Depression, major, recurrent (HCC)     Heart murmur     Hernia     Obesity         Past Surgical History:   Procedure Laterality Date    APPENDECTOMY      PR LIG/TRNSXJ FLP TUBE ABDL/VAG APPR UNI/BI         Family History       Problem (# of Occurrences) Relation (Name,Age of Onset)    Cancer (1) Mother: lung cancer, long history of smkoing     Hypertension (1) Maternal Grandfather    Colorectal Cancer (1) Maternal Grandmother           Negative family history of: Uterine Cancer, Prolapse, Incontinence            Social History     Tobacco Use    Smoking status: Never    Smokeless tobacco: Never   Substance Use Topics    Alcohol use: Never    Drug use: Never       Outpatient Medications Prior to Visit   Medication Sig Dispense Refill    amLODIPine 10 MG tablet Take 1 tablet (10 mg) by mouth daily. 90 tablet 3    atorvastatin 40 MG tablet Take 1 tablet (40 mg) by mouth daily. 90 tablet 3    buPROPion XL 150 MG 24 hr tablet Take 1 tablet (150 mg) by mouth daily. In addition to the 300 mg dose. 90 tablet 3    buPROPion XL 300 MG 24 hr tablet Take 1 tablet (300  mg) by mouth daily. Take in addition to 150 mg dose. 90 tablet 3    clotrimazole 1 % cream Apply topically 2 times a day. For fungal rash. 45 g 2    FLUoxetine 20 MG capsule Take 3 capsules (60 mg) by mouth daily. 270 capsule 3    furosemide 20 MG tablet Take 1 tablet (20 mg) by mouth daily. 90 tablet 3    metroNIDAZOLE 0.75 % topical gel Apply topically 2 times a day. 45 g 3    mupirocin 2 % ointment Apply to open lesions on lower legs and ankles three times daily 1 Tube 0    nystatin (Nystop) 100000 UNIT/GM powder APPLY TO THE AFFECTED AREA TWICE DAILY 15 g 0    oxyBUTYnin 5 MG tablet Take 1 tablet (5 mg) by mouth 2 times a day. Take up to 3 times a day 90 tablet 12    semaglutide (Ozempic) 0.25 or 0.5 mg dose (2 mg/3 mL) pen-injector Inject 0.25 mg under the skin one time a week. In 4 weeks increase to 0.5 mg. 3 mL 1    triamcinolone 0.1 % cream Apply to affected area on leg(s) 2 times a day. 1 Tube 3     No facility-administered medications prior to visit.       Review of patient's allergies indicates:  No Known Allergies    REVIEW OF SYSTEMS: Pertinent positive and negative systems are described in the HPI; the remaining of the 14 systems are negative      PHYSICAL EXAMINATION:  BP (!) 145/84    Pulse 66   Temp 36.3 C (Temporal)   Ht  (1.626 m)   Wt (!) 149.2 kg (329 lb)   LMP 03/31/2017 (Within Days)   SpO2 97%   BMI 56.47 kg/m   Body mass index is 56.47 kg/m.   Constitutional: She is oriented to person, place, and time and well-developed. No distress.  PVR via ultrasound was 0 mL    Assessment/Plan:    In summary, Holly Blair is a 57 year old with urge predominant mixed urinary incontinence.  Normal PVR today.    We discussed the etiologies of both UUI and SUI. We reviewed that there is not one ideal treatment to correct both issues. She reports that the urge symptoms are most bothersome and would like to address those first.      We discussed that OAB is a common, chronic condition that can have a significant impact on a patient's quality of life. It is caused by miscommunication between the bladder and the brain.     We discussed the treatment pathway for OAB which includes therapies involving multiple steps or levels.   1st line: Behavioral and dietary modifications (avoiding bladder irritants and caffeine, trial of Prelief, restricting fluids, bladder retraining or timed voiding) and Kegel exercises either alone or with a physical therapist. Can also use vaginal estrogen in postmenopausal women.   2nd line: Oral medications such as anticholinergics or beta-3 agonists. If ineffective as monotherapy, these medications can be used together for a synergistic effect.   3rd line: Peripheral tibial nerve stimulation, intradetrusor Botox and sacral neuromodulation.    She drinks a significant amount of fluid so I recommended decreasing fluid intake.  Also discussed avoiding bladder irritants and she was provided a list of these.  She can also trial Prelief if wanted.  We discussed bladder retraining if she is still voiding hourly after decreasing her fluids.  Given oxybutynin no  longer controlling her symptoms and side effects that she is having, recommended switching to mirabegron if  insurance will cover it.  This prescription was sent to her pharmacy.  She also would like to trial vaginal estrogen.  Discussed how to apply and frequency.  This prescription was also sent to her pharmacy.      We will reevaluate her SUI symptoms at her follow-up visit.    Plan:   Limit fluids to 50 to 64 ounces  Avoid bladder irritants, can trial Prelief  Bladder retraining if limiting fluids does not improve frequency  Mirabegron   Vaginal estrogen twice weekly    All of her questions and concerns were addressed to her apparent satisfaction. She knows to call the clinic sooner if needed.     Verita Schneiders, MD  Assistant Professor  Urogynecology    Referred by: Susa Loffler, ARNP  Referral follow up letter sent to: [facility], ARNP

## 2022-09-14 NOTE — Patient Instructions (Addendum)
1) Limit fluids to 50-64oz  2) Avoid bladder irritants - try Prelief  3) Bladder retraining - add 10 minutes each week to time between voids  4) Start mirabegron, stop oxybutynin  5) Start vaginal estrogen twice weekly

## 2022-09-16 ENCOUNTER — Encounter (INDEPENDENT_AMBULATORY_CARE_PROVIDER_SITE_OTHER): Payer: Self-pay | Admitting: Obstetrics & Gynecology

## 2022-10-18 ENCOUNTER — Ambulatory Visit: Payer: Medicare HMO | Attending: Ophthalmology | Admitting: Ophthalmology

## 2022-10-18 DIAGNOSIS — E119 Type 2 diabetes mellitus without complications: Secondary | ICD-10-CM | POA: Insufficient documentation

## 2022-10-18 DIAGNOSIS — H5213 Myopia, bilateral: Secondary | ICD-10-CM | POA: Insufficient documentation

## 2022-10-18 DIAGNOSIS — H52203 Unspecified astigmatism, bilateral: Secondary | ICD-10-CM | POA: Insufficient documentation

## 2022-10-18 DIAGNOSIS — H524 Presbyopia: Secondary | ICD-10-CM | POA: Insufficient documentation

## 2022-10-18 DIAGNOSIS — E669 Obesity, unspecified: Secondary | ICD-10-CM | POA: Insufficient documentation

## 2022-10-18 DIAGNOSIS — E1169 Type 2 diabetes mellitus with other specified complication: Secondary | ICD-10-CM | POA: Insufficient documentation

## 2022-10-18 DIAGNOSIS — H25813 Combined forms of age-related cataract, bilateral: Secondary | ICD-10-CM | POA: Insufficient documentation

## 2022-10-18 NOTE — Progress Notes (Signed)
CC:  Patient seen in consultation at the request of Susa Loffler, [place] for diabetic eye exam    HPI:  57 yo F, last seen at [facility] clinic in 2013, who presents for diabetic eye exam.  No recent vision changes nor eye discomfort.      POH:  no prior history of eye injury or eye surgery    PMH:  Patient Active Problem List   Diagnosis    Depression, major, recurrent (HCC)    Chronic knee pain    Chronic back pain    Senile nuclear cataract    Myopia with astigmatism and presbyopia    Morbid obesity with BMI of 50.0-59.9, adult (HCC)    Hyperlipidemia    Systolic murmur    Anxiety    Rosacea    Essential hypertension    Pulmonary HTN (HCC)    Prediabetes    Heart failure with preserved ejection fraction (HCC)    OSA (obstructive sleep apnea)    Mild aortic stenosis      Hemoglobin A1C (%)   Date Value   02/02/2022 6.9 (H)   12/23/2020 6.4 (H)      SH:  fiance was former patient of mine and passed away 2 years ago    Assessment/Plan:  1. Type II DM, no diabetic retinopathy  --recommend annual DFE    2.  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  --monitor    3.  Myopia, astigmatism, presbyopia OU  --Prescription for glasses dispensed today per pt request    RTC 1 tear    Marilynne Halsted, MD

## 2022-10-25 ENCOUNTER — Telehealth (HOSPITAL_BASED_OUTPATIENT_CLINIC_OR_DEPARTMENT_OTHER): Payer: Self-pay | Admitting: Naturopath

## 2022-10-25 NOTE — Telephone Encounter (Signed)
Pt is calling requesting a call back from Dr. Andres Shad nurse regarding her e visit care for kidney , she states.

## 2022-11-01 NOTE — Telephone Encounter (Signed)
She has fasting labs that are overdue. They are in the system. Please have her come in for fasting lab work and schedule f/u appointment with me for review. We can also discuss anxiety at that time.

## 2022-11-01 NOTE — Telephone Encounter (Signed)
TC to patient.  She has 3 concerns:    She received a message from MyChart that she is due for a diabetes kidney health evaluation.  Is she supposed to schedule something? She's unsure what this means.  Re-application for paratransit shuttle is due and she needs Elan to fill out paperwork.  She will have it faxed to our office.  When she say Dr. Margaretmary Eddy in urogyn, she believes she was told to stop taking bupropion.  She's unsure why and RN cannot find reference to this in MD notes.  Pt has noticed significant increase in anxiety since discontinuing it.  RN will review with PCP and get back to her about this.    RN will route this questions/concern to PCP and return phone call with updates.

## 2022-11-03 NOTE — Telephone Encounter (Signed)
Called patient to relay the above. Reiterated fasting instructions for labs and pt verbalized understanding. Pt states she also rec'd renewal paperwork (on 10/28/22) for her transit that she completed and mailed back to company on 5/13 who said they would be sending our office the necessary form to process paperwork.Pt reports that she needs this completed before June 1st. Pt provided para transit contact info (phone: [phone], fax: [phone]). RN also scheduled pt for follow up with PCP next available via telemedicine to discuss lab results and medication questions. RN will update patient after contacting transit company     time on phone: 17:44 minutes     Called [place] [employer] and spoke with a eligibility rep who states they have not received any paperwork as of time of call and will fax required form to our office to complete if needed.

## 2022-11-11 NOTE — Telephone Encounter (Signed)
Pt calling to know if Clinic has received paper work from the [employer].

## 2022-11-12 ENCOUNTER — Telehealth (HOSPITAL_BASED_OUTPATIENT_CLINIC_OR_DEPARTMENT_OTHER): Payer: Self-pay | Admitting: Naturopath

## 2022-11-12 NOTE — Telephone Encounter (Signed)
Pt calling to know if Clinic has received paper work from the [employer].      Pt calling again!!

## 2022-11-13 ENCOUNTER — Other Ambulatory Visit (INDEPENDENT_AMBULATORY_CARE_PROVIDER_SITE_OTHER): Payer: Medicare HMO

## 2022-11-13 DIAGNOSIS — E1169 Type 2 diabetes mellitus with other specified complication: Secondary | ICD-10-CM

## 2022-11-13 DIAGNOSIS — E782 Mixed hyperlipidemia: Secondary | ICD-10-CM

## 2022-11-13 LAB — LIPID PANEL
Cholesterol/HDL Ratio: 2.4
HDL Cholesterol: 57 mg/dL (ref >39)
LDL Cholesterol, NIH Equation: 60 mg/dL (ref <130)
Non-HDL Cholesterol: 79 mg/dL (ref 0–159)
Total Cholesterol: 136 mg/dL (ref <200)
Triglyceride: 103 mg/dL (ref <150)

## 2022-11-13 LAB — GLUCOSE, FASTING: Glucose, Fasting: 295 mg/dL — ABNORMAL HIGH (ref 62–125)

## 2022-11-16 ENCOUNTER — Encounter (INDEPENDENT_AMBULATORY_CARE_PROVIDER_SITE_OTHER): Payer: Self-pay | Admitting: Naturopath

## 2022-11-16 ENCOUNTER — Ambulatory Visit: Payer: Medicare HMO | Admitting: Naturopath

## 2022-11-16 DIAGNOSIS — E118 Type 2 diabetes mellitus with unspecified complications: Secondary | ICD-10-CM

## 2022-11-16 DIAGNOSIS — F331 Major depressive disorder, recurrent, moderate: Secondary | ICD-10-CM

## 2022-11-16 LAB — LAB ADD ON ORDER

## 2022-11-16 LAB — HEMOGLOBIN A1C, RAPID

## 2022-11-16 MED ORDER — EMPAGLIFLOZIN 10 MG OR TABS
10.0000 mg | ORAL_TABLET | Freq: Every morning | ORAL | 1 refills | Status: DC
Start: 2022-11-16 — End: 2023-03-22

## 2022-11-16 NOTE — Telephone Encounter (Signed)
OPEN IN ERROR 

## 2022-11-16 NOTE — Telephone Encounter (Signed)
Pt calling to request her pcp to complete the transit paperwork and return it asap.    Paper work is in the [employer].

## 2022-11-16 NOTE — Telephone Encounter (Signed)
RETURN CALL: Voicemail - Detailed Message      SUBJECT:  Paperwork    MESSAGE: Patient calling advised she is needing paperwork for [facility] to be completed and returned as soon as possible. Requesting call back to discuss further.

## 2022-11-16 NOTE — Addendum Note (Signed)
Addended by: Eden Lathe on: 11/16/2022 10:25 AM     Modules accepted: Orders

## 2022-11-16 NOTE — Progress Notes (Signed)
Is this visit being conducted using Telemedicine with live, interactive video and audio? Telemedicine (live, interactive video & audio)   Distant Site Telemedicine Encounter  I conducted this encounter via secure, live, face-to-face video conference with the patient. I reviewed the risks and benefits of telemedicine as pertinent to this visit and the patient agreed to proceed.    Provider Location: On-site location (clinic, hospital, on-site office)  Patient Location: At home    Present with patient: No one else present     Telemedicine Consent   This visit was conducted via Telehealth in lieu of an in-person encounter in my clinic due to the current COVID-19 outbreak and the risk that an in-person visit could pose to the patient, clinic staff, or the general public.  I confirmed the patient's name, date of birth, and callback number:YES  I introduced myself and showed my badge: YES  I explained that this visit will be billed the same as a face-to-face visit and patient consented to use telemedicine for their medical care today:YES  Patient reported all questions regarding telemedicine have been answered: YES    I received consent from the patient to have the visit by secure audio and video, including the statement: "I cannot provide the same evaluation as in a face-to-face visit. I may need you to come in for further evaluation or care. If at any time your would like to be seen in-person, we will terminate the visit and connect you to the most feasible in-person care. The technology is encrypted and secure; however, no technology is 100% hack-proof. The technology is dependent on a reliable internet connection."       No chief complaint on file.         Subjective:     Holly Blair is a 57 year old female who presents on 11/16/2022.    She is having a lot of anxiety. She thinks it is related to where she is living and her roommate. She does not feel safe in her neighborhood. 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   She is taking 60 mg Fluoxetine and 450 mg Wellbutrin. She is wondering if she can go up on either of these medications.     She had recent high fasting glucose tests (295). She does not tolerate Metformin. She is on low dose Ozempic and never follow-ed up to titrate up on the dose. She has been missing her dose frequently because she is struggling with the injections. She would like to switch to a pill form. She denies increased thirst and urinary frequency, dizziness and nausea. She is not able to check her blood sugar levels.        Objective:    Vitals: LMP 03/31/2017 (Within Days)   Physical Exam  Mood and affect appropriate   NAD     Assessment and Plan:        Diagnoses and all orders for this visit:    Diabetes mellitus type 2 with complications (HCC)  Discussed concerns with uncontrolled blood sugar levels. Switch to Jardiance which should be easier to be compliant with. F/U 3-4 weeks for testing and appointment. ED precautions given.   -     empagliflozin (Jardiance) 10 MG tablet; Take 1 tablet (10 mg) by mouth every morning.  -     Glucose, Fasting; Future  -     Hemoglobin A1c, Rapid;  Future  -     Albumin/Creatinine Ratio, Random Urine; Future    Moderate episode of recurrent major depressive disorder (HCC)  Trial increasing Fluoxetine to 80 mg daily. Continue with Wellbutrin. F/U 3-4 weeks.

## 2022-11-17 ENCOUNTER — Encounter (HOSPITAL_BASED_OUTPATIENT_CLINIC_OR_DEPARTMENT_OTHER): Payer: Self-pay | Admitting: Naturopath

## 2022-11-17 DIAGNOSIS — N289 Disorder of kidney and ureter, unspecified: Secondary | ICD-10-CM

## 2022-11-17 LAB — HEMOGLOBIN A1C, HPLC: Hemoglobin A1C: 12.1 % — ABNORMAL HIGH (ref 4.0–6.0)

## 2022-11-17 NOTE — Telephone Encounter (Signed)
Pt calling to know if clinic has faxed back transit paperwork.

## 2022-11-17 NOTE — Addendum Note (Signed)
Addended by: Gaynelle Cage on: 11/17/2022 07:04 AM     Modules accepted: Level of Service

## 2022-11-17 NOTE — Telephone Encounter (Signed)
I have not received any paperwork at this time.

## 2022-11-18 ENCOUNTER — Telehealth (HOSPITAL_BASED_OUTPATIENT_CLINIC_OR_DEPARTMENT_OTHER): Payer: Self-pay | Admitting: Naturopath

## 2022-11-18 NOTE — Telephone Encounter (Signed)
RETURN CALL: Voicemail - Detailed Message      SUBJECT:  Form/Letter/Paperwork Request - Paperwork     DATE NEEDED BY: ASAP   ADDITIONAL INFORMATION: Patient's medical transportation is going to expire and patient is asking for her care team to call  [employer] [employer] to ensure they have the information they need to renew her case.     Patient is asking for this paperwork to be expidate and for her care team to call [employer] at phone number [phone] on her behalf.     She would also like the clinic to follow-up with her regarding this. Thank you please advise.

## 2022-11-18 NOTE — Telephone Encounter (Signed)
Received the form yesterday and was given to Elan to complete.

## 2022-11-18 NOTE — Telephone Encounter (Signed)
Rec'd transit paperwork completed by PCP and scanned and uploaded into patient's chart under media tab 11/18/22    Completed form faxed to [facility] transit via [employer] to fax number #[phone]    Mychart update sent to patient.

## 2022-12-12 ENCOUNTER — Other Ambulatory Visit (HOSPITAL_BASED_OUTPATIENT_CLINIC_OR_DEPARTMENT_OTHER): Payer: Self-pay | Admitting: Naturopath

## 2022-12-12 DIAGNOSIS — E118 Type 2 diabetes mellitus with unspecified complications: Secondary | ICD-10-CM

## 2022-12-15 ENCOUNTER — Encounter (HOSPITAL_BASED_OUTPATIENT_CLINIC_OR_DEPARTMENT_OTHER): Payer: Medicare HMO | Admitting: Naturopath

## 2022-12-21 ENCOUNTER — Other Ambulatory Visit (HOSPITAL_BASED_OUTPATIENT_CLINIC_OR_DEPARTMENT_OTHER): Payer: Self-pay | Admitting: Cardiovascular Disease

## 2022-12-27 LAB — CERVICAL CANCER SCREENING: Cytologic Impression: HIGH

## 2023-01-05 ENCOUNTER — Encounter (HOSPITAL_BASED_OUTPATIENT_CLINIC_OR_DEPARTMENT_OTHER): Payer: Medicare HMO | Admitting: Naturopath

## 2023-01-11 ENCOUNTER — Other Ambulatory Visit (HOSPITAL_BASED_OUTPATIENT_CLINIC_OR_DEPARTMENT_OTHER): Payer: Self-pay | Admitting: Naturopath

## 2023-01-11 DIAGNOSIS — Z1231 Encounter for screening mammogram for malignant neoplasm of breast: Secondary | ICD-10-CM

## 2023-01-20 ENCOUNTER — Encounter (HOSPITAL_BASED_OUTPATIENT_CLINIC_OR_DEPARTMENT_OTHER): Payer: Medicare HMO | Admitting: Cardiovascular Disease

## 2023-01-20 ENCOUNTER — Ambulatory Visit (HOSPITAL_COMMUNITY): Payer: Medicare HMO

## 2023-01-20 ENCOUNTER — Ambulatory Visit (HOSPITAL_BASED_OUTPATIENT_CLINIC_OR_DEPARTMENT_OTHER): Payer: Medicare HMO | Admitting: Cardiovascular Disease

## 2023-01-24 ENCOUNTER — Other Ambulatory Visit: Payer: Self-pay

## 2023-02-11 ENCOUNTER — Telehealth (HOSPITAL_BASED_OUTPATIENT_CLINIC_OR_DEPARTMENT_OTHER): Payer: Self-pay

## 2023-02-11 NOTE — Telephone Encounter (Signed)
RETURN CALL: No Call Back Needed      SUBJECT:  General Message     MESSAGE: Patient states she received message and confirms this appt works for her.

## 2023-02-11 NOTE — Telephone Encounter (Signed)
RETURN CALL: Voicemail - Detailed Message      SUBJECT:  Appointment Request     REASON FOR VISIT: Diabetes Type II with complications  PREFERRED DATE/TIME: Telemedicine Afternoon  REASON UNABLE TO APPOINT: no appointments available. CCR called clinic and they are busy

## 2023-02-11 NOTE — Telephone Encounter (Signed)
Rescheduled to next available TM NEW 10/28 12:30pm with Dr. Ammie Ferrier, left pt detailed voicemail.

## 2023-02-18 ENCOUNTER — Ambulatory Visit (HOSPITAL_BASED_OUTPATIENT_CLINIC_OR_DEPARTMENT_OTHER): Payer: Medicare HMO | Admitting: Naturopath

## 2023-02-18 ENCOUNTER — Ambulatory Visit (HOSPITAL_BASED_OUTPATIENT_CLINIC_OR_DEPARTMENT_OTHER): Payer: Medicare HMO

## 2023-03-03 ENCOUNTER — Ambulatory Visit (HOSPITAL_BASED_OUTPATIENT_CLINIC_OR_DEPARTMENT_OTHER): Payer: Medicare HMO | Admitting: "Endocrinology

## 2023-03-09 ENCOUNTER — Ambulatory Visit (HOSPITAL_COMMUNITY): Payer: Medicare HMO

## 2023-03-11 ENCOUNTER — Ambulatory Visit (HOSPITAL_BASED_OUTPATIENT_CLINIC_OR_DEPARTMENT_OTHER): Payer: Medicare HMO

## 2023-03-11 ENCOUNTER — Ambulatory Visit (HOSPITAL_BASED_OUTPATIENT_CLINIC_OR_DEPARTMENT_OTHER): Payer: Medicare HMO | Admitting: Naturopath

## 2023-03-15 ENCOUNTER — Ambulatory Visit (HOSPITAL_COMMUNITY): Payer: Medicare HMO

## 2023-03-21 ENCOUNTER — Other Ambulatory Visit: Payer: Self-pay

## 2023-03-22 ENCOUNTER — Other Ambulatory Visit (INDEPENDENT_AMBULATORY_CARE_PROVIDER_SITE_OTHER): Payer: Self-pay | Admitting: Obstetrics & Gynecology

## 2023-03-22 ENCOUNTER — Other Ambulatory Visit (HOSPITAL_BASED_OUTPATIENT_CLINIC_OR_DEPARTMENT_OTHER): Payer: Self-pay | Admitting: Naturopath

## 2023-03-22 DIAGNOSIS — N3281 Overactive bladder: Secondary | ICD-10-CM

## 2023-03-22 DIAGNOSIS — F331 Major depressive disorder, recurrent, moderate: Secondary | ICD-10-CM

## 2023-03-22 DIAGNOSIS — F419 Anxiety disorder, unspecified: Secondary | ICD-10-CM

## 2023-03-22 DIAGNOSIS — I10 Essential (primary) hypertension: Secondary | ICD-10-CM

## 2023-03-22 DIAGNOSIS — E118 Type 2 diabetes mellitus with unspecified complications: Secondary | ICD-10-CM

## 2023-03-22 DIAGNOSIS — E7800 Pure hypercholesterolemia, unspecified: Secondary | ICD-10-CM

## 2023-03-22 DIAGNOSIS — I5032 Chronic diastolic (congestive) heart failure: Secondary | ICD-10-CM

## 2023-03-23 ENCOUNTER — Other Ambulatory Visit (HOSPITAL_BASED_OUTPATIENT_CLINIC_OR_DEPARTMENT_OTHER): Payer: Self-pay | Admitting: Naturopath

## 2023-03-23 DIAGNOSIS — I10 Essential (primary) hypertension: Secondary | ICD-10-CM

## 2023-03-24 MED ORDER — ATORVASTATIN CALCIUM 40 MG OR TABS
40.0000 mg | ORAL_TABLET | Freq: Every day | ORAL | 1 refills | Status: DC
Start: 2023-03-24 — End: 2023-09-23

## 2023-03-24 MED ORDER — AMLODIPINE BESYLATE 10 MG OR TABS
10.0000 mg | ORAL_TABLET | Freq: Every day | ORAL | 1 refills | Status: DC
Start: 2023-03-24 — End: 2023-09-23

## 2023-03-24 MED ORDER — FUROSEMIDE 20 MG OR TABS
20.0000 mg | ORAL_TABLET | Freq: Every day | ORAL | 1 refills | Status: DC
Start: 2023-03-24 — End: 2023-09-23

## 2023-03-24 MED ORDER — FLUOXETINE HCL 20 MG OR CAPS
80.0000 mg | ORAL_CAPSULE | Freq: Every day | ORAL | 2 refills | Status: DC
Start: 2023-03-24 — End: 2024-02-24

## 2023-03-24 MED ORDER — EMPAGLIFLOZIN 10 MG OR TABS
10.0000 mg | ORAL_TABLET | Freq: Every morning | ORAL | 1 refills | Status: DC
Start: 2023-03-24 — End: 2023-10-21

## 2023-03-24 MED ORDER — BUPROPION HCL ER (XL) 300 MG OR TB24
300.0000 mg | EXTENDED_RELEASE_TABLET | Freq: Every day | ORAL | 1 refills | Status: DC
Start: 2023-03-24 — End: 2023-09-23

## 2023-03-24 MED ORDER — BUPROPION HCL ER (XL) 150 MG OR TB24
150.0000 mg | EXTENDED_RELEASE_TABLET | Freq: Every day | ORAL | 1 refills | Status: DC
Start: 2023-03-24 — End: 2023-09-23

## 2023-03-24 NOTE — Telephone Encounter (Signed)
This request requires your approval:    Fluoxetine-last visit plan was trialing 80mg  daily.  Defer to provider.    If this medication is denied please have your staff informed the patient and next visit on 05/16/23.

## 2023-03-28 ENCOUNTER — Telehealth (HOSPITAL_BASED_OUTPATIENT_CLINIC_OR_DEPARTMENT_OTHER): Payer: Self-pay | Admitting: Obstetrics & Gynecology

## 2023-03-28 NOTE — Telephone Encounter (Signed)
Called pharmacy to clarify confusion. Pt went to pharmacy to pick up Mirabegron and Oxybutynin, Mirabegron too soon to refill (due for refill in Nov). Pharmacist stated that pt came into pharmacy wanting both medications, pharmacist warned pt that under his knowledge, pt should not be taking both medications together, pt was upset by this response and told pharmacist that oxybutynin is not for her bladder, but was for her anxiety.   When reviewing Dr. Gloris Ham progress note from her [facility] 09/14/2022, she stated: "Given oxybutynin no longer controlling her symptoms and side effects that she is having, recommended switching to mirabegron if insurance will cover it." Routing to provider to verify that pt should discontinue oxybutynin and continue to take mirabegron.

## 2023-03-28 NOTE — Telephone Encounter (Signed)
RETURN CALL: Voicemail - Detailed Message      SUBJECT:  Medication Questions     NAME OF MEDICATION(S):   mirabegron ER (Myrbetriq) 50 MG     ADDITIONAL INFORMATION: Patient is asking the clinic to call her pharmacy due to confusion of giving her the previous one back in March and attempting to give her something different?  Wanted to clarify which one she is supposed to take.

## 2023-03-29 ENCOUNTER — Ambulatory Visit (HOSPITAL_BASED_OUTPATIENT_CLINIC_OR_DEPARTMENT_OTHER): Payer: Medicare HMO | Admitting: Cardiovascular Disease

## 2023-04-11 ENCOUNTER — Encounter (HOSPITAL_BASED_OUTPATIENT_CLINIC_OR_DEPARTMENT_OTHER): Payer: Self-pay

## 2023-04-18 ENCOUNTER — Ambulatory Visit: Payer: Medicare HMO | Admitting: "Endocrinology

## 2023-04-18 VITALS — Ht 64.0 in

## 2023-04-18 DIAGNOSIS — Z6841 Body Mass Index (BMI) 40.0 and over, adult: Secondary | ICD-10-CM

## 2023-04-18 DIAGNOSIS — I5032 Chronic diastolic (congestive) heart failure: Secondary | ICD-10-CM

## 2023-04-18 DIAGNOSIS — E1165 Type 2 diabetes mellitus with hyperglycemia: Secondary | ICD-10-CM

## 2023-04-18 MED ORDER — OZEMPIC (0.25 OR 0.5 MG/DOSE) 2 MG/3ML SC SOPN
PEN_INJECTOR | SUBCUTANEOUS | 1 refills | Status: DC
Start: 2023-04-18 — End: 2023-07-21

## 2023-04-18 MED ORDER — BLOOD GLUCOSE MONITORING SUPPL KIT
PACK | Freq: Two times a day (BID) | 1 refills | Status: DC
Start: 2023-04-18 — End: 2023-11-17

## 2023-04-18 MED ORDER — GLUCOSE BLOOD VI STRP
1.0000 | ORAL_STRIP | Freq: Two times a day (BID) | 1 refills | Status: DC
Start: 2023-04-18 — End: 2023-11-02

## 2023-04-18 MED ORDER — LANCETS THIN MISC
1.0000 | Freq: Two times a day (BID) | 3 refills | Status: DC
Start: 2023-04-18 — End: 2023-11-17

## 2023-04-18 NOTE — Patient Instructions (Addendum)
 Medication changes during this visit:   Start spironolactone  12.5mg  (which is half a tablet)  Get your lab work done in 1 week. You have your lab slip    Return to our [facility] in 6 months    Testing to complete before your next visit: Please schedule your echocardiogram    Please stop by the Outpatient Blood Draw lab before every cardiology clinic appointment for blood work.    Please bring your medication bottles and medication list with you to every visit.    Take your medications as prescribed and let us  know if you feel that the medications are causing any side effect. If you miss a dose and it's almost time for your next dose, just wait and take your next dose at the normal time. Don't take a double dose.    Please avoid salt intake and follow healthy life style. Limit canned, dried, packaged, and fast foods because that type of food is rich in salt.Please check your blood pressure, heart rate and  weight daily  at home and keep records of it. Bring your log with you to cardiology clinic appointment.    Weigh yourself when you wake up, after going to bathroom. Do not eat, drink, or get dressed prior to weighing yourself. Call our clinic if your weight gain is 2 pounds in one day or 5 pounds in a week        For any other questions or concerns, please call my nurse, Rosaline Hint, at [phone].    For appointments or rescheduling, please call [phone] option [phone].    The fax number for the clinic [phone].

## 2023-04-18 NOTE — Progress Notes (Signed)
New Patient HPI-    Patient referred for consultation by her PCP for diabetes type II management.    Distant Site Telemedicine Encounter  I conducted this encounter via secure, live, face-to-face video conference with the patient. I reviewed the risks and benefits of telemedicine as pertinent to this visit and the patient agreed to proceed.    Provider Location: Off-site location (home, non-[place] location)  Patient Location: At work    Present with patient: No one else present     Diagnosis of Diabetes: Many years back.  She has a home caregiver who comes twice a week.  She does not drive and needs to arrange a ride to get to clinic.    List Current Medications for Glycemic control and the doses:  Jardiance 10 mg daily.      She reports that she is allergic to metformin.    A1c -12.1% checked 10/2022.    SMBG (self monitored blood glucose) readings: Does not have blood sugar meter.  Does not check blood sugars.  Hypoglycemia -denies any symptoms.    No blood sugar data!    - Eyes: Seen at [facility] in 09/2022.   - Kidneys: On SGLT2 inhibitors.  Creatinine 0.8 and GFR more than 60 checked 06/2022.    Diet: She reports changing her diet significantly since her last A1c.  Eat small portions.   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    Has heart failure.     Review of system-  Except as noted in HPI, the remainder of complete review of systems is otherwise negative.     ______________________________________________________________________      Past Medical History:   Diagnosis Date    Acne     Anxiety state, unspecified     Arthritis     Cataract     Chronic back pain     Chronic knee pain     Depression, major, recurrent (HCC)     Heart murmur     Hernia     Obesity        Past Surgical History:   Procedure Laterality Date    APPENDECTOMY      PR LIG/TRNSXJ FLP TUBE ABDL/VAG APPR UNI/BI         Family History       Problem (# of Occurrences) Relation (Name,Age of Onset)    Cancer (1) Mother: lung cancer, long history of  smkoing    Hypertension (1) Maternal Grandfather    Colorectal Cancer (1) Maternal Grandmother           Negative family history of: Uterine Cancer, Prolapse, Incontinence            Social History     Tobacco Use    Smoking status: Never    Smokeless tobacco: Never   Substance Use Topics    Alcohol use: Never    Drug use: Never         Current Outpatient Medications   Medication Sig Dispense Refill    amLODIPine 10 MG tablet Take 1 tablet (10 mg) by mouth daily. 90 tablet 1    atorvastatin 40 MG tablet Take 1 tablet (40 mg) by mouth daily. 90 tablet 1    blood glucose monitoring supply kit Use 2 times a day. Use to check blood sugar. 1 kit 1    blood glucose test strip Use 1 strip 2 times a day. Use to check blood sugar. 200 strip 1    buPROPion XL 150 MG  24 hr tablet Take 1 tablet (150 mg) by mouth daily. In addition to the 300 mg dose. 90 tablet 1    buPROPion XL 300 MG 24 hr tablet Take 1 tablet (300 mg) by mouth daily. Take in addition to 150 mg dose. 90 tablet 1    clotrimazole 1 % cream Apply topically 2 times a day. For fungal rash. 45 g 2    empagliflozin (Jardiance) 10 MG tablet Take 1 tablet (10 mg) by mouth every morning. 90 tablet 1    estradiol 0.1 MG/GM vaginal cream Place 1 g into the vagina 2 times a week. 42.5 g 3    FLUoxetine 20 MG capsule Take 4 capsules (80 mg) by mouth daily. Dose changed on 11/16/22 360 capsule 2    furosemide 20 MG tablet Take 1 tablet (20 mg) by mouth daily. 90 tablet 1    lancets thin Use 1 each 2 times a day. 200 each 3    metroNIDAZOLE 0.75 % topical gel Apply topically 2 times a day. 45 g 3    mirabegron ER (Myrbetriq) 50 MG 24 hr tablet Take 1 tablet (50 mg) by mouth daily. 90 tablet 3    mupirocin 2 % ointment Apply to open lesions on lower legs and ankles three times daily 1 Tube 0    nystatin (Nystop) 100000 UNIT/GM powder APPLY TO THE AFFECTED AREA TWICE DAILY 15 g 0    semaglutide (Ozempic) 0.25 or 0.5 mg dose (2 mg/3 mL) pen-injector Inject 0.25 mg under the skin  one time a week for 4 weeks, THEN 0.5 mg one time a week. 3 mL 1    triamcinolone 0.1 % cream Apply to affected area on leg(s) 2 times a day. 1 Tube 3     No current facility-administered medications for this visit.       Review of patient's allergies indicates:  Allergies   Allergen Reactions    Metformin Dizziness and ZH:[id number]/[id number]         _________________________________________________________________    Ht 5\' 4"  (1.626 m) Comment: pt reported  LMP 03/31/2017 (Within Days)   BMI 56.47 kg/m    Body mass index is 56.47 kg/m.     Physical exam-    GENERAL: Awake, alert and is in no apparent distress  EYES: no exophthalmos, lag or stare  RESPIRATORY: normal efforts of breathing, no tachypnea  PSYCHIATRIC: mood and affect are normal    _____________________________________________________________________    Labs-        Latest Ref Rng & Units 02/02/2021     4:22 PM 02/02/2022    10:45 AM 02/02/2022    11:44 AM 07/07/2022     1:38 PM 09/14/2022    10:55 AM 11/13/2022     1:56 PM 04/18/2023    12:08 PM   DIABETES TRACKING   Weight    325 lb 329 lb 6.4 oz 329 lb  --   BP    137/81 135/75 145/84     Total Glycated Hemoglobin 4.0 - 6.0 %  4.0 - 6.0 %  6.9     Wrong test ordered by practitioner     12.1     HDL Cholesterol >39 mg/dL 44      57     Cholesterol (LDL) <130 mg/dL 841      60          Lab Results   Component Value Date    TSH 2.829 12/23/2020    TSH 2.440 02/09/2017  Reviewed relevant labs in Epic.    Lab Results   Component Value Date    A1C Wrong test ordered by practitioner 11/13/2022    A1C 12.1 (H) 11/13/2022    A1C 6.9 (H) 02/02/2022     Lab Results   Component Value Date    CHOLESTEROL 136 11/13/2022    CHOLESTEROL 177 02/02/2021    CHOLESTEROL 182 12/23/2020    TRIGLYCERIDE 103 11/13/2022    TRIGLYCERIDE 108 02/02/2021    TRIGLYCERIDE 109 12/27/2018    LDL 60 11/13/2022    LDL 111 02/02/2021    LDL 295 12/27/2018    HDL 57 11/13/2022    HDL 44 02/02/2021    HDL 40 12/23/2020    CHOLHDLRATIO 2.4  11/13/2022    CHOLHDLRATIO 4.0 02/02/2021    CHOLHDLRATIO 4.6 12/23/2020     Lab Results   Component Value Date    TSH 2.829 12/23/2020    TSH 2.581 02/23/2019    TSH 2.440 02/09/2017     Lab Results   Component Value Date    GLUCOSE 295 (H) 11/13/2022    SODIUM 138 07/07/2022    POTASSIUM 3.9 07/07/2022    CL 102 07/07/2022    CO2 29 07/07/2022    BUN 14 07/07/2022    CREATININE 0.80 07/07/2022    GFR >60 07/07/2022    PROTEIN 8.4 (H) 07/07/2022    ALBUMIN 4.0 07/07/2022    ALT 16 07/07/2022    AST 13 07/07/2022    ALK 99 07/07/2022    CA 9.2 07/07/2022    MAGNESIUM 2.1 07/07/2022    VITD 21.3 02/09/2017    HEMOGLOBIN 13.0 07/07/2022     No results found for: "ALBCREATRATU"    _____________________________________________________________________    Assessment/Recommendations-      Holly Blair was seen today for diabetes.    Diagnoses and all orders for this visit:    Type 2 diabetes mellitus with hyperglycemia, without long-term current use of insulin (HCC)  -     semaglutide (Ozempic) 0.25 or 0.5 mg dose (2 mg/3 mL) pen-injector; Inject 0.25 mg under the skin one time a week for 4 weeks, THEN 0.5 mg one time a week.  -     blood glucose monitoring supply kit; Use 2 times a day. Use to check blood sugar.  -     blood glucose test strip; Use 1 strip 2 times a day. Use to check blood sugar.  -     lancets thin; Use 1 each 2 times a day.  BMI 50.0-59.9, adult (HCC)  -     semaglutide (Ozempic) 0.25 or 0.5 mg dose (2 mg/3 mL) pen-injector; Inject 0.25 mg under the skin one time a week for 4 weeks, THEN 0.5 mg one time a week.  Chronic heart failure with preserved ejection fraction (HCC)  -     semaglutide (Ozempic) 0.25 or 0.5 mg dose (2 mg/3 mL) pen-injector; Inject 0.25 mg under the skin one time a week for 4 weeks, THEN 0.5 mg one time a week.    Patient with uncontrolled type 2 diabetes.  Unfortunately we do not have any blood sugar data or updated A1c for today's visit.  We discussed the importance of continued  monitoring of her blood sugars.  Patient knows how to check her blood sugars with a blood sugar meter.  Prescription for blood sugar meter and supplies done today.  Recommend she checks blood blood sugars at least twice a day and shares data with  [place].  Given her history of heart failure and diabetes we discussed about initiating GLP-1 agonist.  Her BMI is 56.  Side effects and precautions of this medication discussed with patient.  Recommend she call us if she needs any assistance getting started on Ozempic.  She is already started on SGLT2 inhibitors through her PCP.    Continue Jardiance 10 mg daily.  Please pick up blood sugar meter and supplies from pharmacy today.  Start checking blood sugars at least twice daily.  Bring in meter for review.  Start Ozempic 0.25 mg weekly.  In 2 weeks if tolerating well increase to 0.5 mg weekly.  Please bring all your medications for next visit which will be in person.  PharmD in 1 month for in-person visit. Consider sample libre start if sample available.   Prachi in 2 months.  RTC with me in 3-4 months.   Noted you are scheduled for labs for your PCP visit next month.  We will review it during visit.            _____________________________________________________________________    After visit summary given to patient.    Continue Jardiance 10 mg daily.  Please pick up blood sugar meter and supplies from pharmacy today.  Start checking blood sugars at least twice daily.  Bring in meter for review.  Start Ozempic 0.25 mg weekly.  In 2 weeks if tolerating well increase to 0.5 mg weekly.  Please bring all your medications for next visit which will be in person.  PharmD in 1 month for in-person visit. Consider sample libre start if sample available.   Prachi in 2 months.  RTC with me in 3-4 months.   Noted you are scheduled for labs for your PCP visit next month.  We will review it during visit.      Patient established with [place] and will continue to follow up for ongoing  management of chronic medical problems mentioned above.   I spent a total of > 60 minutes for the patient's care on the date of the service.

## 2023-04-18 NOTE — Progress Notes (Signed)
[facility] Cardiology Return Patient Note    Patient: Holly Blair   Age:57 year old   MRN: [id number]   PCP: Susa Loffler, ARNP       Chief Complaint      HFpEF    History of Present Illness      Holly Blair is a 57 year old  year-old woman who returns for follow up of HFpEF, mild AS, OSA, obesity, PH, HTN and HLD.     Previously followed by Dr Kellie Moor who has departed [place]    05/20/22: NOS due to medical transportation issue    07/06/22: She notices that she has difficulty keeping up pace with other people or going up hills. On flat ground she does better and uses a walker to ambulate at baseline. She does water aerobics at the [facility]. She denies dizziness, presyncope or syncope.  She spoke at length about the passing of her fiance, as this is the 2 year anniversary. She is counseling to deal with the grief. 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    04/21/23: TTE?    Past Medical/Surgical History     HFpEF; TTE 2/21: LVEF 75% with impaired LV diastolic relaxation. TTE 09/22/20: LVEF 60-65%, abnormal diastolic fxn.  Mild aortic stenosis  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  Systemic hypertension on amlodipine  Hyperlipidemia on atorvastatin    Patient Active Problem List   Diagnosis    Depression, major, recurrent (HCC)    Chronic knee pain    Chronic back pain    Senile nuclear cataract    Myopia with astigmatism and presbyopia    Morbid obesity with BMI of 50.0-59.9, adult (HCC)    Hyperlipidemia    Systolic murmur    Anxiety    Rosacea    Essential hypertension    Pulmonary HTN (HCC)    Prediabetes    Heart failure with preserved ejection fraction (HCC)    OSA (obstructive sleep apnea)    Mild aortic stenosis       Medications     Current Outpatient Medications   Medication Instructions    amLODIPine (NORVASC) 10 mg, Oral, Daily    atorvastatin (LIPITOR) 40 mg, Oral, Daily    blood glucose monitoring supply kit Does not  apply, 2 times daily, Use to check blood sugar.    blood glucose test strip 1 strip, Does not apply, 2 times daily, Use to check blood sugar.    buPROPion XL (WELLBUTRIN XL) 150 mg, Oral, Daily, In addition to the 300 mg dose.    buPROPion XL (WELLBUTRIN XL) 300 mg, Oral, Daily, Take in addition to 150 mg dose.    clotrimazole 1 % cream Topical, 2 times daily, For fungal rash.    empagliflozin (JARDIANCE) 10 mg, Oral, Every morning    estradiol (ESTRACE) 1 g, Vaginal, 2 times weekly    FLUoxetine (PROZAC) 80 mg, Oral, Daily, Dose changed on 11/16/22    furosemide (LASIX) 20 mg, Oral, Daily    lancets thin 1 each, Does not apply, 2 times daily    metroNIDAZOLE 0.75 % topical gel Topical, 2 times daily    mirabegron ER (MYRBETRIQ) 50 mg, Oral, Daily    mupirocin 2 % ointment Apply to open lesions on lower legs and ankles three times daily    nystatin (Nystop) 100000 UNIT/GM powder APPLY TO THE AFFECTED AREA TWICE DAILY    semaglutide (  Ozempic) 0.25 or 0.5 mg dose (2 mg/3 mL) pen-injector Inject 0.25 mg under the skin one time a week for 4 weeks, THEN 0.5 mg one time a week.    triamcinolone 0.1 % cream Leg(s), 2 times daily         Allergies     Review of patient's allergies indicates:  Allergies   Allergen Reactions    Metformin Dizziness and VH:QIONGE/[id number]          Physical Examination                                                       Vitals:  There were no vitals filed for this visit.       (no weight taken for this visit)   There is no height or weight on file to calculate BMI.       General:  Well-nourished adult in no distress    Eye:  Pupils are equal, round and reactive to light, Extraocular movements are intact, Normal conjunctiva.    HENT:  Normal.    Neck:  Supple   Respiratory:  Lungs are clear to auscultation, No chest wall tenderness. Chest wall appearance normal.  Cardiovascular:  JVP not elevated, regular rate and rhythm, S1, S2, no S3, no S4, Murmurs: 2/6 SEM upper LSB with no radiation.     Vascular: Carotids normal with no bruits  Gastrointestinal:  Soft, Non-tender, No organomegaly.   Lymphatics:  1+ non pitting lower extremity edema.    Musculoskeletal:  Normal range of motion.  No swelling.  Integumentary:  Warm, Dry.  No rashes or skin changes  Neurologic:  Alert, Oriented, No focal defects.       Database     Labs  Pending    No results found for any visits on 04/21/23.      TTE (date: 11/20/18)  1. Normal LV size and systolic function  2. Evidence of diastolic compliance abnormality  3. Mild aortic stenosis  4. Mitral annular calciafication with 4 mm gradient  E/e' is increased and indicative of increased filling pressures.     TTE (date: 08/17/18)  Normal LV size and systolic function (EF 65%). No regional wall motion abnormalities.  Normal RV size and function.   Trace to mild aortic insufficiency. Mild-moderate aortic stenosis. Otherwise normal valvular function.   Mild pulmonary hypertension. Estimated PASP 39-36mmHg based on an estimated right atrial pressure of 5-38mmHg.  No pericardial effusion.      TTE (date: 07/24/2019):  1. 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 There is associated mild regurgitation. Guidelines suggest repeat echo in 3-5 years.   2. Normal left ventricular chamber size with hyperdynamic systolic function (EF 75%). No regional wall motion abnormalities seen. Impaired LV diastolic relaxation with likely normal filling pressures.  3. here is mild mitral annular calcification. Otherwise normal valvular anatomy and function.   4. Right ventricle not well visualized but probably normal size and function.   5. Mild pulmonary hypertension (PASP 36-41 mmHg) with mild central venous pressure (5-10 mmHg).  6. No pericardial effusion.  Compared to prior study, no major changes.     TTE 09/22/20: LVEF 60-65%, abnormal diastolic fxn.     Other Imaging:   CT Chest PE Protocol (date: 11/19/18)  No evidence of pulmonary  embolism.  -Mildly enlarged bilateral axillary lymph nodes, indeterminate in etiology. Consider further investigation if clinically indicated.  -Dilated main pulmonary artery and reversal of main pulmonary artery to aorta ratio indicates presence of pulmonary hypertension.  -Combination of groundglass opacity, minimal septal thickening, and bilateral pleural effusions represents presence of pulmonary edema.       Impression and Plan       Impression  HFpEF; TTE 2/21: LVEF 75% with impaired LV diastolic relaxation.   TTE 09/22/20: LVEF 60-65%, abnormal diastolic fxn.  NYHA Class II  Euvolemic on exam  Mild aortic stenosis  BMI 56  Pulmonary hypertension WHO group III  Systemic hypertension, controlled  Hyperlipidemia        Plan:  Schedule TTE   Return to this clinic in 6 months     CC:  PCP: Susa Loffler, ARNP       I personally interviewed and examined this patient during this visit and confirmed all of the information noted.  The impression and plan above was dictated or typed by me and was directed by my personal evaluation of this patient.     I spent a total of 30 minutes for the patient's care on the date of the service.  This includes the time reviewing the chart, time with the patient in the exam room,  and time completing the note on the date of service.       Signature Line    Wilmet[place] V. Deitrich Steve MD [facility]  Professor of Medicine  Advanced Heart Failure and Transplant Cardiology

## 2023-04-18 NOTE — Patient Instructions (Addendum)
Continue Jardiance 10 mg daily.  Please pick up blood sugar meter and supplies from pharmacy today.  Start checking blood sugars at least twice daily.  Bring in meter for review.  Start Ozempic 0.25 mg weekly.  In 2 weeks if tolerating well increase to 0.5 mg weekly.  Please bring all your medications for next visit which will be in person.  PharmD in 1 month for in-person visit. Consider sample libre start if sample available.   Prachi in 2 months.  RTC with me in 3-4 months.   Noted you are scheduled for labs for your PCP visit next month.  We will review it during visit.      --------------------------------------------     Clinic Location:    [facility]  [address]  [address],  [place], [place] [address]  Phone: [phone]  Fax: [phone]    Test results, prescriptions, and questions:    [facility] MyChart allows you to view your test results, send non-urgent messages to your doctor, renew your prescriptions, schedule appointments, and more. If you do not already have an enrollment code, please contact our office.     Your blood tests and other study results should be completed, interpreted and conveyed to you within 1-2 weeks. If your results are urgent, we will contact you immediately. If you have not heard from [place] in two weeks, please contact us or send a message through MyChart.

## 2023-04-20 ENCOUNTER — Telehealth (HOSPITAL_BASED_OUTPATIENT_CLINIC_OR_DEPARTMENT_OTHER): Payer: Self-pay | Admitting: "Endocrinology

## 2023-04-20 DIAGNOSIS — E1165 Type 2 diabetes mellitus with hyperglycemia: Secondary | ICD-10-CM

## 2023-04-20 MED ORDER — SHARPS CONTAINER MISC
1 refills | Status: DC
Start: 2023-04-20 — End: 2024-04-26

## 2023-04-20 NOTE — Telephone Encounter (Signed)
Prescription for the sharp container is sent

## 2023-04-20 NOTE — Telephone Encounter (Signed)
RETURN CALL: Voicemail - Detailed Message      SUBJECT:  Orders     ORDERS FOR: sharps container  FREQUENCY:   VERBAL OR FAXED ORDER REQUEST: [phone]  ADDITIONAL INFORMATION: Patient is requesting a prescription to be sent to [facility] for a sharps container. She states that the pharmacy will not give her one without an order. Please call patient back with any questions and to let her know when order has been placed. Thank you!

## 2023-04-21 ENCOUNTER — Ambulatory Visit: Payer: Medicare HMO | Attending: Cardiovascular Disease | Admitting: Cardiovascular Disease

## 2023-04-21 ENCOUNTER — Telehealth (HOSPITAL_BASED_OUTPATIENT_CLINIC_OR_DEPARTMENT_OTHER): Payer: Self-pay | Admitting: "Endocrinology

## 2023-04-21 VITALS — BP 156/78 | HR 55 | Temp 97.0°F | Ht 64.0 in | Wt 303.0 lb

## 2023-04-21 DIAGNOSIS — I110 Hypertensive heart disease with heart failure: Secondary | ICD-10-CM | POA: Insufficient documentation

## 2023-04-21 DIAGNOSIS — I350 Nonrheumatic aortic (valve) stenosis: Secondary | ICD-10-CM | POA: Insufficient documentation

## 2023-04-21 DIAGNOSIS — E782 Mixed hyperlipidemia: Secondary | ICD-10-CM | POA: Insufficient documentation

## 2023-04-21 DIAGNOSIS — I5030 Unspecified diastolic (congestive) heart failure: Secondary | ICD-10-CM | POA: Insufficient documentation

## 2023-04-21 DIAGNOSIS — I10 Essential (primary) hypertension: Secondary | ICD-10-CM | POA: Insufficient documentation

## 2023-04-21 DIAGNOSIS — Z6841 Body Mass Index (BMI) 40.0 and over, adult: Secondary | ICD-10-CM | POA: Insufficient documentation

## 2023-04-21 LAB — CBC, DIFF
% Basophils: 0 %
% Eosinophils: 1 %
% Immature Granulocytes: 1 %
% Lymphocytes: 27 %
% Monocytes: 5 %
% Neutrophils: 66 %
% Nucleated RBC: 0 %
Absolute Eosinophil Count: 0.12 10*3/uL (ref 0.00–0.50)
Absolute Lymphocyte Count: 4.38 10*3/uL (ref 1.00–4.80)
Basophils: 0.05 10*3/uL (ref 0.00–0.20)
Hematocrit: 45 % (ref 36.0–45.0)
Hemoglobin: 14.4 g/dL (ref 11.5–15.5)
Immature Granulocytes: 0.1 10*3/uL — ABNORMAL HIGH (ref 0.00–0.05)
MCH: 29.3 pg (ref 27.3–33.6)
MCHC: 32.3 g/dL (ref 32.2–36.5)
MCV: 91 fL (ref 81–98)
Monocytes: 0.72 10*3/uL (ref 0.00–0.80)
Neutrophils: 10.63 10*3/uL — ABNORMAL HIGH (ref 1.80–7.00)
Nucleated RBC: 0 10*3/uL
Platelet Count: 309 10*3/uL (ref 150–400)
RBC: 4.91 10*6/uL (ref 3.80–5.00)
RDW-CV: 15 % — ABNORMAL HIGH (ref 11.0–14.5)
WBC: 16 10*3/uL — ABNORMAL HIGH (ref 4.3–10.0)

## 2023-04-21 LAB — COMPREHENSIVE METABOLIC PANEL
ALT (GPT): 14 U/L (ref 7–33)
AST (GOT): 13 U/L (ref 9–38)
Albumin: 4.3 g/dL (ref 3.5–5.2)
Alkaline Phosphatase (Total): 138 U/L — ABNORMAL HIGH (ref 31–132)
Anion Gap: 13 — ABNORMAL HIGH (ref 4–12)
Bilirubin (Total): 0.9 mg/dL (ref 0.2–1.3)
Calcium: 9.7 mg/dL (ref 8.9–10.2)
Carbon Dioxide, Total: 23 meq/L (ref 22–32)
Chloride: 102 meq/L (ref 98–108)
Creatinine: 0.9 mg/dL (ref 0.38–1.02)
Glucose: 127 mg/dL — ABNORMAL HIGH (ref 62–125)
Potassium: 4 meq/L (ref 3.6–5.2)
Protein (Total): 8.8 g/dL — ABNORMAL HIGH (ref 6.0–8.2)
Sodium: 138 meq/L (ref 135–145)
Urea Nitrogen: 10 mg/dL (ref 8–21)
eGFR by CKD-EPI 2021: >60 mL/min/{1.73_m2} (ref >59)

## 2023-04-21 LAB — B_TYPE NATRIURETIC PEPTIDE: B_Type Natriuretic Peptide: 149 pg/mL — ABNORMAL HIGH (ref <101)

## 2023-04-21 LAB — MAGNESIUM: Magnesium: 2.3 mg/dL (ref 1.8–2.4)

## 2023-04-21 MED ORDER — SPIRONOLACTONE 25 MG OR TABS
12.5000 mg | ORAL_TABLET | Freq: Every day | ORAL | 3 refills | Status: DC
Start: 2023-04-21 — End: 2023-11-25

## 2023-04-21 NOTE — Telephone Encounter (Signed)
Left message to schedule:    : Return in about 4 months (around 08/19/2023).   Check out comments: PharmD in 1 month for in-person visit.  Prachi in 2 months.

## 2023-04-26 ENCOUNTER — Ambulatory Visit (HOSPITAL_COMMUNITY): Payer: Self-pay

## 2023-04-26 ENCOUNTER — Ambulatory Visit
Admission: RE | Admit: 2023-04-26 | Discharge: 2023-04-26 | Disposition: A | Discharge status: Home / Self Care | Payer: Medicare HMO | Attending: Student in an Organized Health Care Education/Training Program | Admitting: Student in an Organized Health Care Education/Training Program

## 2023-04-26 ENCOUNTER — Encounter (HOSPITAL_BASED_OUTPATIENT_CLINIC_OR_DEPARTMENT_OTHER): Payer: Self-pay

## 2023-04-26 ENCOUNTER — Other Ambulatory Visit (HOSPITAL_BASED_OUTPATIENT_CLINIC_OR_DEPARTMENT_OTHER): Payer: Self-pay | Admitting: Naturopath

## 2023-04-26 ENCOUNTER — Telehealth (HOSPITAL_BASED_OUTPATIENT_CLINIC_OR_DEPARTMENT_OTHER): Payer: Self-pay | Admitting: Naturopath

## 2023-04-26 ENCOUNTER — Encounter (HOSPITAL_BASED_OUTPATIENT_CLINIC_OR_DEPARTMENT_OTHER): Payer: Self-pay | Admitting: Cardiovascular Disease

## 2023-04-26 ENCOUNTER — Ambulatory Visit (HOSPITAL_BASED_OUTPATIENT_CLINIC_OR_DEPARTMENT_OTHER): Payer: Medicare HMO | Admitting: Naturopath

## 2023-04-26 VITALS — BP 137/64 | HR 73 | Temp 98.4°F | Ht 64.0 in | Wt 306.6 lb

## 2023-04-26 DIAGNOSIS — Z7409 Other reduced mobility: Secondary | ICD-10-CM | POA: Insufficient documentation

## 2023-04-26 DIAGNOSIS — M25562 Pain in left knee: Secondary | ICD-10-CM

## 2023-04-26 DIAGNOSIS — Z1231 Encounter for screening mammogram for malignant neoplasm of breast: Secondary | ICD-10-CM | POA: Insufficient documentation

## 2023-04-26 DIAGNOSIS — Z6841 Body Mass Index (BMI) 40.0 and over, adult: Secondary | ICD-10-CM | POA: Insufficient documentation

## 2023-04-26 DIAGNOSIS — G8929 Other chronic pain: Secondary | ICD-10-CM | POA: Insufficient documentation

## 2023-04-26 DIAGNOSIS — Z1211 Encounter for screening for malignant neoplasm of colon: Secondary | ICD-10-CM | POA: Insufficient documentation

## 2023-04-26 DIAGNOSIS — M25561 Pain in right knee: Secondary | ICD-10-CM | POA: Insufficient documentation

## 2023-04-26 DIAGNOSIS — E118 Type 2 diabetes mellitus with unspecified complications: Secondary | ICD-10-CM

## 2023-04-26 DIAGNOSIS — Z0000 Encounter for general adult medical examination without abnormal findings: Secondary | ICD-10-CM

## 2023-04-26 LAB — HEMOGLOBIN A1C, RAPID: Hemoglobin A1C: 7.7 % — ABNORMAL HIGH (ref 4.0–5.6)

## 2023-04-26 LAB — GLUCOSE, FASTING: Glucose, Fasting: 133 mg/dL — ABNORMAL HIGH (ref 62–125)

## 2023-04-26 NOTE — Telephone Encounter (Signed)
 RETURN CALL: Voicemail - Detailed Message      SUBJECT:  General Message     MESSAGE: Patient got a call that she left her ID cards behind at the appointment. She requests they be mailed to her home address.

## 2023-04-26 NOTE — Telephone Encounter (Signed)
 Per 10/31 OV w/Dr. Franki Monte:  Start spironolactone 12.5mg  (which is half a tablet)  Get your lab work done in 1 week. You have your lab slip    Mychart msg to Pt

## 2023-04-26 NOTE — Progress Notes (Signed)
 Preventive Health Visit    Holly Blair is a 57 year old who presents on 04/26/2023 for a wellness visit.    Subjective      Additional concerns addressed include:    She recently saw Cardiology and has been prescribed to start Spironolactone. She has not yet picked it up from the pharmacy. She has a follow-up appointment scheduled in January.    She is due for colon cancer screening. She had labs done today but did not have urine checked for Microalbumin.     She has been taking 80 mg Fluoxetine and 350 mg Bupropion. Mental health is fairly well managed although she is dealing with stressors of housing situation. She has a case manager that assists her and a mental health therapist.    She would like to get a disability placard that others can use. She is not able to walk greater than 200 feet without assistance.       Current Outpatient Medications   Medication Sig Dispense Refill    amLODIPine 10 MG tablet Take 1 tablet (10 mg) by mouth daily. 90 tablet 1    atorvastatin 40 MG tablet Take 1 tablet (40 mg) by mouth daily. 90 tablet 1    blood glucose monitoring supply kit Use 2 times a day. Use to check blood sugar. 1 kit 1    blood glucose test strip Use 1 strip 2 times a day. Use to check blood sugar. 200 strip 1    buPROPion XL 150 MG 24 hr tablet Take 1 tablet (150 mg) by mouth daily. In addition to the 300 mg dose. 90 tablet 1    buPROPion XL 300 MG 24 hr tablet Take 1 tablet (300 mg) by mouth daily. Take in addition to 150 mg dose. 90 tablet 1    clotrimazole 1 % cream Apply topically 2 times a day. For fungal rash. 45 g 2    empagliflozin (Jardiance) 10 MG tablet Take 1 tablet (10 mg) by mouth every morning. 90 tablet 1    estradiol 0.1 MG/GM vaginal cream Place 1 g into the vagina 2 times a week. (Patient not taking: Reported on 04/26/2023) 42.5 g 3    FLUoxetine 20 MG capsule Take 4 capsules (80 mg) by mouth daily. Dose changed on 11/16/22 360 capsule 2    furosemide 20 MG tablet Take 1 tablet (20 mg) by  mouth daily. 90 tablet 1    glucometer (OneTouch Verio Flex System) w/Device kit Use 2 times a day. Use to check blood sugar.      lancets thin Use 1 each 2 times a day. 200 each 3    metroNIDAZOLE 0.75 % topical gel Apply topically 2 times a day. 45 g 3    mirabegron ER (Myrbetriq) 50 MG 24 hr tablet Take 1 tablet (50 mg) by mouth daily. 90 tablet 3    mupirocin 2 % ointment Apply to open lesions on lower legs and ankles three times daily 1 Tube 0    nystatin (Nystop) 100000 UNIT/GM powder APPLY TO THE AFFECTED AREA TWICE DAILY 15 g 0    semaglutide (Ozempic) 0.25 or 0.5 mg dose (2 mg/3 mL) pen-injector Inject 0.25 mg under the skin one time a week for 4 weeks, THEN 0.5 mg one time a week. 3 mL 1    sharps container Use as instructed. 1 each 1    spironolactone 25 MG tablet Take 0.5 tablets (12.5 mg) by mouth daily. Take with breakfast 45 tablet  3    triamcinolone 0.1 % cream Apply to affected area on leg(s) 2 times a day. 1 Tube 3     No current facility-administered medications for this visit.       Patient Active Problem List   Diagnosis    Depression, major, recurrent (HCC)    Chronic knee pain    Chronic back pain    Senile nuclear cataract    Myopia with astigmatism and presbyopia    Morbid obesity with BMI of 50.0-59.9, adult (HCC)    Hyperlipidemia    Systolic murmur    Anxiety    Rosacea    Essential hypertension    Pulmonary HTN (HCC)    Prediabetes    Heart failure with preserved ejection fraction (HCC)    OSA (obstructive sleep apnea)    Mild aortic stenosis       PREVENTATIVE HEALTH SCREENING   -Mammogram: appointment today  -Cholesterol/glucose Screen: within the past year  -Colon: declines colonoscopy, FIT test given today   -Pap smear: done in 2023  -Dexa: age 61  -Immunizations: up to date     GYN HISTORY  OB History   Gravida Para Term Preterm AB Living   4 4 4  0 0 0   SAB IAB Ectopic Multiple Live Births   0 0 0 0 0     LMP: 03/31/2017   Menses: no postmenopausal bleeding  Last pap: 2023  Pap  history: no history of abnormal paps  Other gyn history: none    SEXUAL HISTORY  Sexual activity: not at present  History of STD: none known  Sexual concerns: No    LIFESTYLE  Diet:  endorses eating healthy, vegetables and whole grains daily,  Exercise: water aerobics weekly, walking     HABITS  Smoker: no  Alcohol: no  Recreational Drug Use: no    SAFETY  Are you afraid of falling: No  Safe in current living space: YES  Regular seat belt use: YES  Stress: around housing     I have reviewed and updated the problem list, medical history, family history, social history, medications, allergies in the electronic medical record.    Objective:    BP 137/64   Pulse 73   Temp 36.9 C (Temporal)   Ht 5\' 4"  (1.626 m)   Wt (!) 139.1 kg (306 lb 9.6 oz)   LMP 03/31/2017 (Within Days)   SpO2 98%   BMI 52.63 kg/m   Body mass index is 52.63 kg/m.  Physical Exam    Assessment and Plan:      Holly Blair was seen today for annual exam.    Diagnoses and all orders for this visit:    Annual physical exam  Health Care Maintenance:  Screening Labs: albumin/creatinine ratio  Pap smear: due 2028  STD Check: not indicated   Mammogram: done today  Colonoscopy: Fit test given     Preventive counseling:   health maintenance  STD prevention   Immunizations   Dietary Counseling   vitamin D supplementation   proper exercise   Follow-up: 1 year    Colon cancer screening  -     Fecal Immunochemical Test (Colorectal Cancer Screen); Future  -     Fecal Immunochemical Test (Colorectal Cancer Screen)    Diabetes mellitus type 2 with complications (HCC)  -     Albumin/Creatinine Ratio, Random Urine; Future  -     Albumin/Creatinine Ratio, Random Urine    Morbid obesity with BMI of 50.0-59.9,  adult (HCC)  -     DISABLED PARKING PERMIT AUTHORIZATION    Chronic pain of both knees  -     DISABLED PARKING PERMIT AUTHORIZATION    Limited mobility  -     DISABLED PARKING PERMIT AUTHORIZATION

## 2023-04-26 NOTE — Patient Instructions (Signed)
Dental Clinic   [phone]

## 2023-04-27 ENCOUNTER — Telehealth (HOSPITAL_BASED_OUTPATIENT_CLINIC_OR_DEPARTMENT_OTHER): Payer: Self-pay | Admitting: Naturopath

## 2023-04-27 LAB — CERVICAL CANCER SCREENING
Cytologic Impression: HIGH
Cytologic Impression: HIGH

## 2023-04-27 NOTE — Telephone Encounter (Signed)
 RETURN CALL: Voicemail - Detailed Message      SUBJECT:  General Message     MESSAGE: Patient left ID and insurance card in the office during her visit yesterday 04/26/2023 at her appointment and would like it mailed to her. She confirmed the address we have on file. Patient reported she is unable to drive to come get it herself. Please advise.

## 2023-04-27 NOTE — Telephone Encounter (Signed)
 Left vm , cards mailed to home address

## 2023-04-27 NOTE — Telephone Encounter (Signed)
 Called pt and informed that it is delivered to home address.

## 2023-04-28 ENCOUNTER — Telehealth (HOSPITAL_BASED_OUTPATIENT_CLINIC_OR_DEPARTMENT_OTHER): Payer: Self-pay | Admitting: Cardiovascular Disease

## 2023-04-28 DIAGNOSIS — I5030 Unspecified diastolic (congestive) heart failure: Secondary | ICD-10-CM

## 2023-04-28 NOTE — Telephone Encounter (Signed)
 Per OV plan on 04/21/23    The patient was started spironolactone and was given an external lab slip to be done locally in 1 week.    Cross reference her email 11/5, she did not get a chance to pick up the medicine until 11/5

## 2023-05-06 ENCOUNTER — Telehealth (HOSPITAL_BASED_OUTPATIENT_CLINIC_OR_DEPARTMENT_OTHER): Payer: Self-pay | Admitting: Naturopath

## 2023-05-06 NOTE — Telephone Encounter (Signed)
 Pt requesting a call from pcp regarding A1C results.

## 2023-05-06 NOTE — Telephone Encounter (Signed)
 Recommend increasing dose to 0.5 mg after one more injection. If symptoms worse she can go back to 0.25 mg dose. Is she urinating a lot or having diarrhea?

## 2023-05-06 NOTE — Telephone Encounter (Signed)
 Call to Holly Blair   She is on Ozempic .25 mg dose  Has done 3 injections   "Going to the bathroom a lot" Having some stomach pain along with that   Advised to stay on same dose unless she hears from [place]    Sent out FIT test in the mail  last week  - advised we will notify with results when we receive them      Note to Roxine Caddy to advise

## 2023-05-10 ENCOUNTER — Ambulatory Visit: Payer: Medicare HMO | Attending: Naturopath

## 2023-05-10 DIAGNOSIS — Z1211 Encounter for screening for malignant neoplasm of colon: Secondary | ICD-10-CM | POA: Insufficient documentation

## 2023-05-11 ENCOUNTER — Other Ambulatory Visit (HOSPITAL_BASED_OUTPATIENT_CLINIC_OR_DEPARTMENT_OTHER): Payer: Self-pay | Admitting: Naturopath

## 2023-05-11 DIAGNOSIS — F331 Major depressive disorder, recurrent, moderate: Secondary | ICD-10-CM

## 2023-05-11 LAB — OCCULT BLOOD BY IA, STL: Occult Bld 1 Result: NEGATIVE

## 2023-05-13 ENCOUNTER — Other Ambulatory Visit (HOSPITAL_BASED_OUTPATIENT_CLINIC_OR_DEPARTMENT_OTHER): Payer: Self-pay | Admitting: Naturopath

## 2023-05-13 DIAGNOSIS — E118 Type 2 diabetes mellitus with unspecified complications: Secondary | ICD-10-CM

## 2023-05-24 NOTE — Telephone Encounter (Signed)
 LVM to f/u on lab- requested call back for f/u.

## 2023-05-30 NOTE — Telephone Encounter (Signed)
LVM with pt asking if she has completed the lab or not, to provide information if done externally, and to check her [facility]. [facility] sent with reminder.

## 2023-05-30 NOTE — Telephone Encounter (Signed)
 After receiving reply from pt stating that she will get the lab done next year, placed a call to pt. LVM asking that she reply back to the [facility] sent. [facility] sent.

## 2023-06-27 ENCOUNTER — Telehealth (HOSPITAL_BASED_OUTPATIENT_CLINIC_OR_DEPARTMENT_OTHER): Payer: Self-pay | Admitting: Cardiovascular Disease

## 2023-06-27 NOTE — Telephone Encounter (Signed)
 Called and left VM regarding f/u lab after starting spironolactone 12.5mg  QD ~05/30/23

## 2023-06-28 ENCOUNTER — Ambulatory Visit (HOSPITAL_COMMUNITY): Payer: Medicare HMO

## 2023-07-13 NOTE — Telephone Encounter (Signed)
Called and left second VM regarding follow up lab after starting spironolactone.    MyChart was also sent today.

## 2023-07-21 ENCOUNTER — Other Ambulatory Visit (HOSPITAL_BASED_OUTPATIENT_CLINIC_OR_DEPARTMENT_OTHER): Payer: Self-pay | Admitting: "Endocrinology

## 2023-07-21 DIAGNOSIS — E1165 Type 2 diabetes mellitus with hyperglycemia: Secondary | ICD-10-CM

## 2023-07-21 DIAGNOSIS — Z6841 Body Mass Index (BMI) 40.0 and over, adult: Secondary | ICD-10-CM

## 2023-07-21 DIAGNOSIS — I5032 Chronic diastolic (congestive) heart failure: Secondary | ICD-10-CM

## 2023-07-25 MED ORDER — OZEMPIC (0.25 OR 0.5 MG/DOSE) 2 MG/3ML SC SOPN
PEN_INJECTOR | SUBCUTANEOUS | 0 refills | Status: DC
Start: 2023-07-25 — End: 2023-08-02

## 2023-07-29 ENCOUNTER — Encounter (HOSPITAL_BASED_OUTPATIENT_CLINIC_OR_DEPARTMENT_OTHER): Payer: Self-pay

## 2023-08-02 ENCOUNTER — Ambulatory Visit: Payer: Medicare HMO | Attending: "Endocrinology | Admitting: "Endocrinology

## 2023-08-02 VITALS — BP 120/70 | HR 97 | Ht 64.0 in | Wt 287.0 lb

## 2023-08-02 DIAGNOSIS — E1165 Type 2 diabetes mellitus with hyperglycemia: Secondary | ICD-10-CM | POA: Insufficient documentation

## 2023-08-02 DIAGNOSIS — I5032 Chronic diastolic (congestive) heart failure: Secondary | ICD-10-CM | POA: Insufficient documentation

## 2023-08-02 DIAGNOSIS — Z6841 Body Mass Index (BMI) 40.0 and over, adult: Secondary | ICD-10-CM | POA: Insufficient documentation

## 2023-08-02 MED ORDER — FREESTYLE LIBRE 3 READER DEVI
0 refills | Status: DC
Start: 2023-08-02 — End: 2023-08-17

## 2023-08-02 MED ORDER — OZEMPIC (1 MG/DOSE) 4 MG/3ML SC SOPN
1.0000 mg | PEN_INJECTOR | SUBCUTANEOUS | 3 refills | Status: DC
Start: 2023-08-02 — End: 2023-11-29

## 2023-08-02 MED ORDER — FREESTYLE LIBRE 3 PLUS SENSOR MISC
3 refills | Status: DC
Start: 2023-08-02 — End: 2023-08-17

## 2023-08-02 NOTE — Patient Instructions (Addendum)
Great job taking meds and losing weight!    Continue-  Jardiance 10 mg daily    Increase-  Ozempic to 1 mg weekly    We have sent for [facility] sensor and reader as your phone is not compatible with the app. You might have to pay out of pocket if not covered by insurance.   RTC in 4 months.     --------------------------------------------     Clinic Location:    [facility]  [address]  [place],  [place], [place] [address]  Phone: [phone]  Fax: [phone]    Test results, prescriptions, and questions:    [facility] MyChart allows you to view your test results, send non-urgent messages to your doctor, renew your prescriptions, schedule appointments, and more. If you do not already have an enrollment code, please contact our office.     Your blood tests and other study results should be completed, interpreted and conveyed to you within 1-2 weeks. If your results are urgent, we will contact you immediately. If you have not heard from [place] in two weeks, please contact us or send a message through MyChart.

## 2023-08-02 NOTE — Progress Notes (Signed)
 Follow up Patient HPI-    Patient seen for follow up of diabetes type II management.    Diagnosis of Diabetes: Many years back.  She has a home caregiver who comes twice a week.  She does not drive and needs to arrange a ride to get to clinic.    Since last visit,   Lost 40 lbs in the last year!     List Current Medications for Glycemic control and the doses:  Jardiance  10 mg daily  Ozempic  0.5 mg weekly [has been on this dose for last 2 months]      She reports that she is allergic to metformin .    A1c -7.7% checked 04/2023.    SMBG (self monitored blood glucose) readings: BG meter. Would like to re-try [place]. Aware that it might not be covered by insurance.   Hypoglycemia -denies any symptoms.    No blood sugar data!    - Eyes: Seen at [facility] in 09/2022.   - Kidneys: On SGLT2 inhibitors.  Creatinine 0.9 and GFR more than 60 checked 03/2023.    Diet: Improved.   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    Has heart failure.     Review of system-  Except as noted in HPI, the remainder of complete review of systems is otherwise negative.     ______________________________________________________________________      Past Medical History:   Diagnosis Date    Acne     Anxiety state, unspecified     Arthritis     Cataract     Chronic back pain     Chronic knee pain     Depression, major, recurrent (HCC)     Heart murmur     Hernia     Obesity        Past Surgical History:   Procedure Laterality Date    APPENDECTOMY      PR LIG/TRNSXJ FLP TUBE ABDL/VAG APPR UNI/BI         Family History       Problem (# of Occurrences) Relation (Name,Age of Onset)    Cancer (1) Mother: lung cancer, long history of smkoing    Hypertension (1) Maternal Grandfather    Colorectal Cancer (1) Maternal Grandmother           Negative family history of: Uterine Cancer, Prolapse, Incontinence            Social History     Tobacco Use    Smoking status: Never    Smokeless tobacco: Never   Substance Use Topics    Alcohol use: Never    Drug use:  Never         Current Outpatient Medications   Medication Sig Dispense Refill    amLODIPine  10 MG tablet Take 1 tablet (10 mg) by mouth daily. 90 tablet 1    atorvastatin  40 MG tablet Take 1 tablet (40 mg) by mouth daily. 90 tablet 1    blood glucose monitoring supply kit Use 2 times a day. Use to check blood sugar. 1 kit 1    blood glucose test strip Use 1 strip 2 times a day. Use to check blood sugar. 200 strip 1    buPROPion  XL 150 MG 24 hr tablet Take 1 tablet (150 mg) by mouth daily. In addition to the 300 mg dose. 90 tablet 1    buPROPion  XL 300 MG 24 hr tablet Take 1 tablet (300 mg) by mouth daily. Take in addition to 150 mg  dose. 90 tablet 1    clotrimazole  1 % cream Apply topically 2 times a day. For fungal rash. 45 g 2    empagliflozin  (Jardiance ) 10 MG tablet Take 1 tablet (10 mg) by mouth every morning. 90 tablet 1    FLUoxetine  20 MG capsule Take 4 capsules (80 mg) by mouth daily. Dose changed on 11/16/22 360 capsule 2    furosemide  20 MG tablet Take 1 tablet (20 mg) by mouth daily. 90 tablet 1    glucometer (OneTouch Verio Flex System) w/Device kit Use 2 times a day. Use to check blood sugar.      lancets thin Use 1 each 2 times a day. 200 each 3    mirabegron  ER (Myrbetriq ) 50 MG 24 hr tablet Take 1 tablet (50 mg) by mouth daily. 90 tablet 3    semaglutide  (Ozempic ) 0.25 or 0.5 mg dose (2 mg/3 mL) pen-injector Inject 0.5 mg under the skin  one time a week. 3 mL 0    sharps container Use as instructed. 1 each 1    spironolactone  25 MG tablet Take 0.5 tablets (12.5 mg) by mouth daily. Take with breakfast 45 tablet 3    triamcinolone  0.1 % cream Apply to affected area on leg(s) 2 times a day. 1 Tube 3     No current facility-administered medications for this visit.       Review of patient's allergies indicates:  Allergies   Allergen Reactions    Metformin  Dizziness and HP:Wjldzj/[id number]         _________________________________________________________________    BP 120/70   Pulse 97   Ht 5' 4 (1.626  m) Comment: pt reported  Wt (!) 130.2 kg (287 lb)   LMP 03/31/2017 (Within Days)   BMI 49.26 kg/m    Body mass index is 49.26 kg/m.       Physical exam-    Constitutional: Is oriented to person, place, and time. Appears well-developed and well-nourished. No distress.   Head: Normocephalic and atraumatic.  Eyes: Conjunctivae are normal. Pupils are reactive to light. Right eye exhibits no discharge. Left eye exhibits no discharge.   Neck: Normal range of motion. Neck supple.     Cardiovascular: Normal rate, regular rhythm and normal heart sounds.   Pulmonary: Effort normal and breath sounds normal.  Abdominal: Soft. Bowel sounds are normal. Exhibits no distension. There is no tenderness.  Musculoskeletal: Normal range of motion. Exhibits no edema.   Neurological: Is alert and oriented to person, place, and time.  Skin: Skin is warm. Is not diaphoretic. No erythema.   Psychiatric: Has a normal mood and affect. Behavior is normal.      _____________________________________________________________________    Labs-        Latest Ref Rng & Units 09/14/2022    10:55 AM 11/13/2022     1:56 PM 04/18/2023    12:08 PM 04/21/2023     1:20 PM 04/26/2023    12:59 PM 04/26/2023     1:07 PM 08/02/2023     2:36 PM   DIABETES TRACKING   Weight  329 lb  -- 303 lb  306 lb 9.6 oz 287 lb   BP  145/84   156/78  137/64 120/70   Total Glycated Hemoglobin 4.0 - 5.6 %  Wrong test ordered by practitioner     12.1    7.7      HDL Cholesterol >39 mg/dL  57         Cholesterol (LDL) <130 mg/dL  60              Lab Results   Component Value Date    TSH 2.829 12/23/2020    TSH 2.440 02/09/2017       Reviewed relevant labs in Epic.    Lab Results   Component Value Date    A1C 7.7 (H) 04/26/2023    A1C Wrong test ordered by practitioner 11/13/2022    A1C 12.1 (H) 11/13/2022     Lab Results   Component Value Date    CHOLESTEROL 136 11/13/2022    CHOLESTEROL 177 02/02/2021    CHOLESTEROL 182 12/23/2020    TRIGLYCERIDE 103 11/13/2022    TRIGLYCERIDE 108  02/02/2021    TRIGLYCERIDE 109 12/27/2018    LDL 60 11/13/2022    LDL 111 02/02/2021    LDL 898 12/27/2018    HDL 57 11/13/2022    HDL 44 02/02/2021    HDL 40 12/23/2020    CHOLHDLRATIO 2.4 11/13/2022    CHOLHDLRATIO 4.0 02/02/2021    CHOLHDLRATIO 4.6 12/23/2020     Lab Results   Component Value Date    TSH 2.829 12/23/2020    TSH 2.581 02/23/2019    TSH 2.440 02/09/2017     Lab Results   Component Value Date    GLUCOSE 133 (H) 04/26/2023    SODIUM 138 04/21/2023    POTASSIUM 4.0 04/21/2023    CL 102 04/21/2023    CO2 23 04/21/2023    BUN 10 04/21/2023    CREATININE 0.90 04/21/2023    GFR >60 04/21/2023    PROTEIN 8.8 (H) 04/21/2023    ALBUMIN 4.3 04/21/2023    ALT 14 04/21/2023    AST 13 04/21/2023    ALK 138 (H) 04/21/2023    CA 9.7 04/21/2023    MAGNESIUM 2.3 04/21/2023    VITD 21.3 02/09/2017    HEMOGLOBIN 14.4 04/21/2023     No results found for: ALBCREATRATU    _____________________________________________________________________    Assessment/Recommendations-    Holly Blair was seen today for diabetes and follow-up .    Diagnoses and all orders for this visit:    Type 2 diabetes mellitus with hyperglycemia, without long-term current use of insulin (HCC)  BMI 45.0-49.9, adult (HCC)  Chronic heart failure with preserved ejection fraction (HCC)    Patient with uncontrolled type 2 diabetes.    Her A1c has significantly improved. She has lost weight. Congratulated patient!  We will titrate up her ozempic  dose as tolerated.   Continue-  Jardiance  10 mg daily  Increase-  Ozempic  to 1 mg weekly  We have sent for libre sensor and reader as your phone is not compatible with the app. You might have to pay out of pocket if not covered by insurance.   RTC in 4 months.         _____________________________________________________________________    After visit summary given to patient.    Great job taking meds and losing weight!    Continue-  Jardiance  10 mg daily    Increase-  Ozempic  to 1 mg weekly    We have sent for libre  sensor and reader as your phone is not compatible with the app. You might have to pay out of pocket if not covered by insurance.   RTC in 4 months.       Patient established with us  and will continue to follow up for ongoing management of chronic medical problems mentioned above.   I spent a total of >  30 minutes for the patient's care on the date of the service.

## 2023-08-03 ENCOUNTER — Telehealth (HOSPITAL_BASED_OUTPATIENT_CLINIC_OR_DEPARTMENT_OTHER): Payer: Self-pay | Admitting: "Endocrinology

## 2023-08-03 NOTE — Telephone Encounter (Signed)
Sent Mychart message to patient.  Cancelled 03/04 w/Sumi (too soon)

## 2023-08-03 NOTE — Telephone Encounter (Signed)
RETURN CALL: MyChart reply      SUBJECT:  Medical Concerns/Information - Medical Concerns/Information      MESSAGE: Patient was seen 2/11 and told to come back in 4 months so June. Patient scheduled June, but also has an appointment March 4th. Does she keep her March 4th? Please assist.

## 2023-08-17 ENCOUNTER — Telehealth (HOSPITAL_BASED_OUTPATIENT_CLINIC_OR_DEPARTMENT_OTHER): Payer: Self-pay | Admitting: "Endocrinology

## 2023-08-17 DIAGNOSIS — E1165 Type 2 diabetes mellitus with hyperglycemia: Secondary | ICD-10-CM

## 2023-08-17 DIAGNOSIS — I5032 Chronic diastolic (congestive) heart failure: Secondary | ICD-10-CM

## 2023-08-17 MED ORDER — FREESTYLE LIBRE 3 PLUS SENSOR MISC
3 refills | Status: DC
Start: 2023-08-17 — End: 2024-01-16

## 2023-08-17 MED ORDER — FREESTYLE LIBRE 3 READER DEVI
0 refills | Status: DC
Start: 2023-08-17 — End: 2024-04-26

## 2023-08-17 NOTE — Telephone Encounter (Signed)
 RETURN CALL: Voicemail - Detailed Message      SUBJECT:  Refill Request     NAME OF MEDICATION(S): [employer] Libre 3 Plus Sensor  [place] 3 Reader     DATE NEEDED BY: 08/17/23  PRESCRIBING PROVIDER: Alvina Filbert NAME/LOCATION: [facility] #[id number] [place] [phone]  ADDITIONAL INFORMATION: [facility] states they did not received refill request. Urgent per caller. Please notify patient when refill send.

## 2023-08-23 ENCOUNTER — Encounter (HOSPITAL_BASED_OUTPATIENT_CLINIC_OR_DEPARTMENT_OTHER): Payer: Medicare HMO | Admitting: "Endocrinology

## 2023-08-25 ENCOUNTER — Ambulatory Visit (HOSPITAL_COMMUNITY): Payer: Medicare HMO

## 2023-09-22 ENCOUNTER — Other Ambulatory Visit (HOSPITAL_BASED_OUTPATIENT_CLINIC_OR_DEPARTMENT_OTHER): Payer: Self-pay | Admitting: Naturopath

## 2023-09-22 ENCOUNTER — Ambulatory Visit
Admission: RE | Admit: 2023-09-22 | Discharge: 2023-09-22 | Disposition: A | Discharge status: Home / Self Care | Payer: Medicare HMO | Attending: Cardiovascular Disease | Admitting: Cardiovascular Disease

## 2023-09-22 DIAGNOSIS — E782 Mixed hyperlipidemia: Secondary | ICD-10-CM | POA: Insufficient documentation

## 2023-09-22 DIAGNOSIS — I10 Essential (primary) hypertension: Secondary | ICD-10-CM | POA: Insufficient documentation

## 2023-09-22 DIAGNOSIS — F331 Major depressive disorder, recurrent, moderate: Secondary | ICD-10-CM

## 2023-09-22 DIAGNOSIS — I110 Hypertensive heart disease with heart failure: Secondary | ICD-10-CM

## 2023-09-22 DIAGNOSIS — Z6841 Body Mass Index (BMI) 40.0 and over, adult: Secondary | ICD-10-CM | POA: Insufficient documentation

## 2023-09-22 DIAGNOSIS — I5030 Unspecified diastolic (congestive) heart failure: Secondary | ICD-10-CM | POA: Insufficient documentation

## 2023-09-22 DIAGNOSIS — E7800 Pure hypercholesterolemia, unspecified: Secondary | ICD-10-CM

## 2023-09-22 DIAGNOSIS — I350 Nonrheumatic aortic (valve) stenosis: Secondary | ICD-10-CM | POA: Insufficient documentation

## 2023-09-22 DIAGNOSIS — I5032 Chronic diastolic (congestive) heart failure: Secondary | ICD-10-CM

## 2023-09-22 LAB — TRANSTHORACIC ECHO (TTE) COMPLETE
AoV max: 291 cm/s
AoV mean PG: 18 mmHg
Ascending aorta: 3.4 cm
Ascending aorta: 3.4 cm
IVSd: 0.86 cm
IVSd: 0.86 cm
LVIDd: 5.3 cm
LVIDd: 5.3 cm
LVIDs: 3.6 cm
LVIDs: 3.6 cm
LVPWd: 0.95 cm
LVPWd: 0.95 cm
RV Free wall pk S': 26.3 mmHg
RVDd: 4.1 cm
RVDd: 4.1 cm
TR Peak Vel: 231 cm/s

## 2023-09-22 MED ORDER — PERFLUTREN LIPID MICROSPHERE 6.52 MG/ML IV SUSP
INTRAVENOUS | Status: AC
Start: 2023-09-22 — End: 2023-09-22
  Filled 2023-09-22: qty 2

## 2023-09-22 MED ORDER — SODIUM CHLORIDE (PF) 0.9 % IJ SOLN
1.0000 mL | Freq: Once | INTRAVENOUS | Status: AC | PRN
Start: 2023-09-22 — End: 2023-09-22
  Administered 2023-09-22: 1 mL via INTRAVENOUS

## 2023-09-23 ENCOUNTER — Telehealth (HOSPITAL_BASED_OUTPATIENT_CLINIC_OR_DEPARTMENT_OTHER): Payer: Self-pay | Admitting: Cardiovascular Disease

## 2023-09-23 DIAGNOSIS — I350 Nonrheumatic aortic (valve) stenosis: Secondary | ICD-10-CM

## 2023-09-23 DIAGNOSIS — I351 Nonrheumatic aortic (valve) insufficiency: Secondary | ICD-10-CM

## 2023-09-23 MED ORDER — AMLODIPINE BESYLATE 10 MG OR TABS
10.0000 mg | ORAL_TABLET | Freq: Every day | ORAL | 1 refills | Status: DC
Start: 2023-09-23 — End: 2023-11-03

## 2023-09-23 MED ORDER — BUPROPION HCL ER (XL) 300 MG OR TB24
300.0000 mg | EXTENDED_RELEASE_TABLET | Freq: Every day | ORAL | 1 refills | Status: DC
Start: 2023-09-23 — End: 2024-04-25

## 2023-09-23 MED ORDER — FUROSEMIDE 20 MG OR TABS
20.0000 mg | ORAL_TABLET | Freq: Every day | ORAL | 1 refills | Status: AC
Start: 2023-09-23 — End: ?

## 2023-09-23 MED ORDER — ATORVASTATIN CALCIUM 40 MG OR TABS
40.0000 mg | ORAL_TABLET | Freq: Every day | ORAL | 1 refills | Status: DC
Start: 2023-09-23 — End: 2023-11-15

## 2023-09-23 MED ORDER — BUPROPION HCL ER (XL) 150 MG OR TB24
150.0000 mg | EXTENDED_RELEASE_TABLET | Freq: Every day | ORAL | 1 refills | Status: DC
Start: 2023-09-23 — End: 2024-04-25

## 2023-09-23 NOTE — Telephone Encounter (Signed)
 Pamboukian, Hector Brunswick, MD  Patterson Hammersmith, RN  Her echo shows mild aortic stenosis and mild to moderate aortic regurg with a decrease in LVEF 75 down to 55%. I would like for her to be seen in [facility]. Can you let her know and place the referral.  Thanks    Patient called, LVM. Mychart msg sent.

## 2023-09-28 ENCOUNTER — Other Ambulatory Visit (HOSPITAL_BASED_OUTPATIENT_CLINIC_OR_DEPARTMENT_OTHER): Payer: Self-pay

## 2023-09-28 DIAGNOSIS — I350 Nonrheumatic aortic (valve) stenosis: Secondary | ICD-10-CM

## 2023-10-19 ENCOUNTER — Other Ambulatory Visit (HOSPITAL_BASED_OUTPATIENT_CLINIC_OR_DEPARTMENT_OTHER): Payer: Self-pay | Admitting: Naturopath

## 2023-10-19 ENCOUNTER — Ambulatory Visit: Payer: Medicare HMO | Attending: Ophthalmology | Admitting: Ophthalmology

## 2023-10-19 DIAGNOSIS — H524 Presbyopia: Secondary | ICD-10-CM | POA: Insufficient documentation

## 2023-10-19 DIAGNOSIS — H5213 Myopia, bilateral: Secondary | ICD-10-CM | POA: Insufficient documentation

## 2023-10-19 DIAGNOSIS — H0288A Meibomian gland dysfunction right eye, upper and lower eyelids: Secondary | ICD-10-CM | POA: Insufficient documentation

## 2023-10-19 DIAGNOSIS — E118 Type 2 diabetes mellitus with unspecified complications: Secondary | ICD-10-CM

## 2023-10-19 DIAGNOSIS — E119 Type 2 diabetes mellitus without complications: Secondary | ICD-10-CM | POA: Insufficient documentation

## 2023-10-19 DIAGNOSIS — H0288B Meibomian gland dysfunction left eye, upper and lower eyelids: Secondary | ICD-10-CM | POA: Insufficient documentation

## 2023-10-19 DIAGNOSIS — H04123 Dry eye syndrome of bilateral lacrimal glands: Secondary | ICD-10-CM | POA: Insufficient documentation

## 2023-10-19 DIAGNOSIS — H52203 Unspecified astigmatism, bilateral: Secondary | ICD-10-CM | POA: Insufficient documentation

## 2023-10-19 DIAGNOSIS — H25813 Combined forms of age-related cataract, bilateral: Secondary | ICD-10-CM | POA: Insufficient documentation

## 2023-10-19 NOTE — Progress Notes (Signed)
 Attending note:    I saw and evaluated the patient. I have reviewed the resident's documentation and agree with it except:    She recently lost 40 lbs and had an improvement in her A1c since starting Ozempic .    Hemoglobin A1C (%)   Date Value   04/26/2023 7.7 (H)   11/13/2022 12.1 (H)      Assessment/Plan:  1. Type II DM, no diabetic retinopathy  --recommend annual DFE    2.  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  --monitor    3.  Myopia, astigmatism, presbyopia OU  --Prescription for glasses dispensed today per pt request    4.  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

## 2023-10-19 NOTE — Progress Notes (Signed)
 CC:  Follow up for diabetic eye exam    HPI:  58 yo F, last seen at [facility] eye [facility] in 2024, who presents for diabetic eye exam.  No recent vision changes. Occasionally has some dryness, no pain or redness    POH:  no prior history of eye injury or eye surgery    PMH:  Patient Active Problem List   Diagnosis    Depression, major, recurrent    Chronic knee pain    Chronic back pain    Senile nuclear cataract    Myopia with astigmatism and presbyopia    Morbid obesity with BMI of 50.0-59.9, adult (HCC)    Hyperlipidemia    Systolic murmur    Anxiety    Rosacea    Essential hypertension    Pulmonary HTN (HCC)    Prediabetes    Heart failure with preserved ejection fraction (HCC)    OSA (obstructive sleep apnea)    Mild aortic stenosis      Hemoglobin A1C (%)   Date Value   04/26/2023 7.7 (H)   11/13/2022 12.1 (H)     Assessment/Plan:  1. Type II DM, no diabetic retinopathy  --recommend annual DFE    2.  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  --monitor    3.  Myopia, astigmatism, presbyopia OU  --Prescription for glasses dispensed today per pt request    4.  Dry eyes OU  --AT QID OU    RTC 1 year    S/w Oretha Birch, MD    I, Melita Springer MD, am functioning in the capacity of a resident in a training program. This patient was seen with the attending. Please refer to their final note (or that of the assigned scribe) for the final assessment and plan.      Melita Springer, MD  Ophthalmology, PGY-2  [facility] [place] 

## 2023-10-19 NOTE — Patient Instructions (Signed)
 Refresh artificial tears or Systane 4 times daily in both eyes

## 2023-10-21 MED ORDER — JARDIANCE 10 MG OR TABS
10.0000 mg | ORAL_TABLET | Freq: Every morning | ORAL | 1 refills | Status: DC
Start: 2023-10-21 — End: 2024-05-07

## 2023-10-26 ENCOUNTER — Ambulatory Visit
Admission: RE | Admit: 2023-10-26 | Discharge: 2023-10-26 | Disposition: A | Discharge status: Home / Self Care | Attending: Unknown Physician Specialty | Admitting: Unknown Physician Specialty

## 2023-10-26 DIAGNOSIS — I350 Nonrheumatic aortic (valve) stenosis: Secondary | ICD-10-CM | POA: Insufficient documentation

## 2023-10-26 LAB — CREATININE BY I_STAT (POC), ~~LOC~~: Creatinine (POC): 0.9 mg/dL (ref 0.38–1.02)

## 2023-10-26 MED ORDER — IOHEXOL 350 MG/ML IV SOLN
80.0000 mL | Freq: Once | INTRAVENOUS | Status: AC | PRN
Start: 2023-10-26 — End: 2023-10-26
  Administered 2023-10-26: 80 mL via INTRAVENOUS

## 2023-10-28 ENCOUNTER — Encounter (HOSPITAL_COMMUNITY): Payer: Self-pay | Admitting: Student in an Organized Health Care Education/Training Program

## 2023-10-28 ENCOUNTER — Telehealth (HOSPITAL_BASED_OUTPATIENT_CLINIC_OR_DEPARTMENT_OTHER): Payer: Self-pay | Admitting: Internal Medicine

## 2023-10-28 NOTE — Telephone Encounter (Signed)
 LVM 4th Attempt    To schedule: TAVR Referral  With provider: Thersa Flavors  Timeline (if applicable):    I left my direct call back number for pt to contact for scheduling. See referral notes if you have questions.    Thanks,    Loris Ros  [facility]  [phone])

## 2023-11-02 ENCOUNTER — Other Ambulatory Visit (HOSPITAL_BASED_OUTPATIENT_CLINIC_OR_DEPARTMENT_OTHER): Payer: Self-pay | Admitting: "Endocrinology

## 2023-11-02 DIAGNOSIS — E1165 Type 2 diabetes mellitus with hyperglycemia: Secondary | ICD-10-CM

## 2023-11-02 NOTE — Patient Instructions (Signed)
 Medication changes during this visit:   Start Entresto 24-26mg  twice a day. Get labs in 1 week (you have lab slip)  Start Toprol XL 12.5mg  once daily in 2 weeks and stop amlopidine    Return to our [facility] Clinic in November    Testing to complete before your next visit: echo    Please stop by the Outpatient Blood Draw lab before every cardiology clinic appointment for blood work.    Please bring your medication bottles and medication list with you to every visit.    Take your medications as prescribed and let us  know if you feel that the medications are causing any side effect. If you miss a dose and it's almost time for your next dose, just wait and take your next dose at the normal time. Don't take a double dose.    Please avoid salt intake and follow healthy life style. Limit canned, dried, packaged, and fast foods because that type of food is rich in salt.Please check your blood pressure, heart rate and  weight daily  at home and keep records of it. Bring your log with you to cardiology clinic appointment.    Weigh yourself when you wake up, after going to bathroom. Do not eat, drink, or get dressed prior to weighing yourself. Call our clinic if your weight gain is 2 pounds in one day or 5 pounds in a week        For any other questions or concerns, please call my nurse, Lonn Roads, at [phone].    For appointments or rescheduling, please call [phone] option [phone].    The fax number for the clinic [phone].

## 2023-11-02 NOTE — Progress Notes (Unsigned)
 Advanced Heart Failure and Transplant Cardiology Return Patient Note    Patient: Holly Blair   Age:58 year old   MRN: [id number]   PCP: Keehn, Elan Cary, ARNP       Chief Complaint      HFpEF    History of Present Illness      Holly Blair is a 58 year old  year-old woman who returns for follow up of HFpEF, mild AS, OSA, obesity, PH, HTN and HLD.     Previously followed by Dr Marca Senna who has departed [place]    05/20/22: NOS due to medical transportation issue    07/06/22: She notices that she has difficulty keeping up pace with other people or going up hills. On flat ground she does better and uses a walker to ambulate at baseline. She does water aerobics at the [facility]. She denies dizziness, presyncope or syncope.  She spoke at length about the passing of her fiance, as this is the 2 year anniversary. She is counseling to deal with the grief. 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    04/21/23: Cut back on food intake and walking more, with 20lb weight loss. Has been given Rx for Ozempic  and anticipates further weight loss. She is still doing water aerobic at the [facility] without CP or SOB. BP at home has been well controlled. She is nervous coming to this appointment so BP is a bit higher here.    11/03/23: TTE 09/22/23: LVEF 55% with LVIDd 5.3, dilated c/w previous study. LVEF decreased 75%>>55%. With enlarging ventricle and decrease in LVEF (although still in the normal range) in the setting of aortic valve disease, referred to Valve Clinic--has not yet scheduled. Has lost 40lbs on Ozempic  with A1C down to 6.5% (12% a year ago). Started on spironolactone  last visit, and overall feeling much better. Occasionally lightheaded if stands too quickly but can walk with walker without any dyspnea. She takes her time though. Plans to join [employer] to do water aerobics.     Heart Failure Checklist     Medical Therapy  Diuretic: Furosemide  20mg  dailly  Beta-Blocker:   ACE-I/ARB/ARNI:   MRA: Spironolactone   12.5mg  daily  SGLT-2 Inhibitor: Jardiance  10mg  daily  Hydralazine/Nitrate:   Digoxin:   Ozempic  2.5 mg weekly sq per patient  Amlodipine  10mg  daily  Device Therapy  ICD/CRT: No indication           Past Medical/Surgical History     HFpEF; TTE 2/21: LVEF 75% with impaired LV diastolic relaxation. TTE 09/22/20: LVEF 60-65%, abnormal diastolic fxn. TTE 09/22/23: LVEF 55% with LVIDd 5.3, dilated c/w previous study.  Mild aortic stenosis  Mild to moderate AR  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  Systemic hypertension on amlodipine   Hyperlipidemia on atorvastatin     Patient Active Problem List   Diagnosis    Depression, major, recurrent    Chronic knee pain    Chronic back pain    Senile nuclear cataract    Myopia with astigmatism and presbyopia    Morbid obesity with BMI of 50.0-59.9, adult (HCC)    Hyperlipidemia    Systolic murmur    Anxiety    Rosacea    Essential hypertension    Pulmonary HTN (HCC)    Prediabetes    Heart failure with preserved ejection fraction (HCC)    OSA (obstructive sleep apnea)    Mild aortic stenosis       Medications  Current Outpatient Medications   Medication Instructions    amLODIPine  (NORVASC ) 10 mg, Oral, Daily    atorvastatin  (LIPITOR) 40 mg, Oral, Daily    blood glucose monitoring supply kit Does not apply, 2 times daily, Use to check blood sugar.    blood glucose test strip 1 strip, Does not apply, 2 times daily, Use to check blood sugar.    buPROPion  XL (WELLBUTRIN  XL) 150 mg, Oral, Daily, Take with 300 mg tablet for total daily dose of 450 mg.    buPROPion  XL (WELLBUTRIN  XL) 300 mg, Oral, Daily, Take with 150 mg tablet for total daily dose of 450 mg.    clotrimazole  1 % cream Topical, 2 times daily, For fungal rash.    FLUoxetine  (PROZAC ) 80 mg, Oral, Daily, Dose changed on 11/16/22    FreeStyle Libre 3 Plus Sensor Apply sensor to the back of upper arm as instructed to continuously monitor blood sugar. Remove and replace every 15 days.    FreeStyle  Libre 3 Reader Use with FreeStyle Libre 3 Sensor for continuous glucose monitoring.    furosemide  (LASIX ) 20 mg, Oral, Daily    glucometer (OneTouch Verio Flex System) w/Device kit Use 2 times a day. Use to check blood sugar.    Jardiance  10 mg, Oral, Every morning    lancets thin 1 each, Does not apply, 2 times daily    mirabegron  ER (MYRBETRIQ ) 50 mg, Oral, Daily    Ozempic  1 mg, Subcutaneous, Weekly    sharps container Use as instructed.    spironolactone  (ALDACTONE ) 12.5 mg, Oral, Daily, Take with breakfast    triamcinolone  0.1 % cream Leg(s), 2 times daily         Allergies     Review of patient's allergies indicates:  Allergies   Allergen Reactions    Metformin  Dizziness and ZO:XWRUEA/[id number]          Physical Examination                                                       Vitals:  Vitals:    11/03/23 1454   BP: 134/65   Pulse: 65   Temp: 36.4 C   SpO2: 99%       Wt 285 lb (129.275 kg)   Body mass index is 48.92 kg/m.       General:  Well-nourished adult in no distress    Eye:  Pupils are equal, round and reactive to light, Extraocular movements are intact, Normal conjunctiva.    HENT:  Normal.    Neck:  Supple   Respiratory:  Lungs are clear to auscultation, No chest wall tenderness. Chest wall appearance normal.  Cardiovascular:  JVP not elevated, regular rate and rhythm, S1, S2, no S3, no S4, Murmurs: 2/6 SEM LSB radiating to neck    Gastrointestinal:  Soft, Non-tender, No organomegaly.   Lymphatics:  No swelling.  Integumentary:  Warm, Dry.  No rashes or skin changes  Neurologic:  Alert, Oriented, No focal defects.       Database     Labs      Office Visit on 11/03/23   1. B-Type Natriuretic Peptide   Result Value Ref Range    B_Type Natriuretic Peptide 90 <101 pg/mL   2. CBC with Diff   Result Value Ref Range    WBC  12.72 (H) 4.3 - 10.0 10*3/uL    RBC 4.29 3.80 - 5.00 10*6/uL    Hemoglobin 12.9 11.5 - 15.5 g/dL    Hematocrit 39 16.1 - 45.0 %    MCV 92 81 - 98 fL    MCH 30.1 27.3 - 33.6 pg    MCHC  32.7 32.2 - 36.5 g/dL    Platelet Count 096 045 - 400 10*3/uL    RDW-CV 13.8 11.0 - 14.5 %    % Neutrophils 66 %    % Lymphocytes 29 %    % Monocytes 4 %    % Eosinophils 1 %    % Basophils 0 %    % Immature Granulocytes 0 %    Neutrophils 8.31 (H) 1.80 - 7.00 10*3/uL    Absolute Lymphocyte Count 3.65 1.00 - 4.80 10*3/uL    Monocytes 0.56 0.00 - 0.80 10*3/uL    Absolute Eosinophil Count 0.13 0.00 - 0.50 10*3/uL    Basophils 0.02 0.00 - 0.20 10*3/uL    Immature Granulocytes 0.05 0.00 - 0.05 10*3/uL    Nucleated RBC 0.00 0.00 10*3/uL    % Nucleated RBC 0 %   3. Comprehensive Metabolic Panel   Result Value Ref Range    Sodium 138 135 - 145 meq/L    Potassium 3.8 3.6 - 5.2 meq/L    Chloride 102 98 - 108 meq/L    Carbon Dioxide, Total 29 22 - 32 meq/L    Anion Gap 7 4 - 12    Glucose 99 62 - 125 mg/dL    Urea Nitrogen 12 8 - 21 mg/dL    Creatinine 4.09 8.11 - 1.02 mg/dL    Protein (Total) 7.7 6.0 - 8.2 g/dL    Albumin 4.0 3.5 - 5.2 g/dL    Bilirubin (Total) 0.7 0.2 - 1.3 mg/dL    Calcium  9.2 8.9 - 10.2 mg/dL    AST (GOT) 11 9 - 38 U/L    Alkaline Phosphatase (Total) 124 31 - 132 U/L    ALT (GPT) 9 7 - 33 U/L    eGFR by CKD-EPI 2021 >60 >59 mL/min/1.73_m2   4. Lipid Panel   Result Value Ref Range    Total Cholesterol 204 (H) <200 mg/dL    Triglyceride 914 <782 mg/dL    HDL Cholesterol 45 >95 mg/dL    Non-HDL Cholesterol 159 0 - 159 mg/dL    LDL Cholesterol, NIH Equation 139 (H) <130 mg/dL    Cholesterol/HDL Ratio 4.5     Lipid Panel, Additional Info. (NOTE)    5. Magnesium   Result Value Ref Range    Magnesium 2.0 1.8 - 2.4 mg/dL         TTE (date: 12/09/28)  1. Normal LV size and systolic function  2. Evidence of diastolic compliance abnormality  3. Mild aortic stenosis  4. Mitral annular calciafication with 4 mm gradient  E/e' is increased and indicative of increased filling pressures.     TTE (date: 08/17/18)  Normal LV size and systolic function (EF 65%). No regional wall motion abnormalities.  Normal RV size and  function.   Trace to mild aortic insufficiency. Mild-moderate aortic stenosis. Otherwise normal valvular function.   Mild pulmonary hypertension. Estimated PASP 39-63mmHg based on an estimated right atrial pressure of 5-37mmHg.  No pericardial effusion.      TTE (date: 07/24/2019):  1. 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 There is associated  mild regurgitation. Guidelines suggest repeat echo in 3-5 years.   2. Normal left ventricular chamber size with hyperdynamic systolic function (EF 75%). No regional wall motion abnormalities seen. Impaired LV diastolic relaxation with likely normal filling pressures.  3. here is mild mitral annular calcification. Otherwise normal valvular anatomy and function.   4. Right ventricle not well visualized but probably normal size and function.   5. Mild pulmonary hypertension (PASP 36-41 mmHg) with mild central venous pressure (5-10 mmHg).  6. No pericardial effusion.  Compared to prior study, no major changes.     TTE 09/22/20: LVEF 60-65%, abnormal diastolic fxn.    TTE 09/22/23: LVEF 55% with LVIDd 5.3, dilated c/w previous study.     Other Imaging:   CT Chest PE Protocol (date: 11/19/18)  No evidence of pulmonary embolism.  -Mildly enlarged bilateral axillary lymph nodes, indeterminate in etiology. Consider further investigation if clinically indicated.  -Dilated main pulmonary artery and reversal of main pulmonary artery to aorta ratio indicates presence of pulmonary hypertension.  -Combination of groundglass opacity, minimal septal thickening, and bilateral pleural effusions represents presence of pulmonary edema.       Impression and Plan       Impression  HFpEF; TTE 2/21: LVEF 75% with impaired LV diastolic relaxation. TTE 09/22/20: LVEF 60-65%, abnormal diastolic fxn. TTE 09/22/23: LVEF 55% with LVIDd 5.3, dilated c/w previous study.  NYHA Class I-II  Euvolemic on exam  Mild aortic stenosis  Moderate AR  BMI 48;  weight loss on Ozempic   Pulmonary hypertension WHO group III  Systemic hypertension, BP acceptable  Hyperlipidemia on atorvastatin   Diabetes with significant improvement in A1C         Plan:  LVEF has declined 70>>55 with dilation of ventricle, therefore will start Entresto 24-26mg  BID as part of GDMT. Follow up labs in 1 week. Lab slip given.  Plan to start beta blocker next with Toprol XL 12.5mg  once daily in 2 weeks. Stop amlodipine  at that time.  Has not been scheduled in Valve Clinic--plan to schedule at time of return.   Plan to repeat TTE in November   Return to this clinic in November.     CC:  PCP: Keehn, Elan Cary, ARNP       I personally interviewed and examined this patient during this visit and confirmed all of the information noted.  The impression and plan above was dictated or typed by me and was directed by my personal evaluation of this patient.     I spent a total of 30 minutes for the patient's care on the date of the service.  This includes the time reviewing the chart, time with the patient in the exam room,  and time completing the note on the date of service.       Signature Line    Kotlik V. Danashia Landers MD [facility]  Professor of Medicine  Advanced Heart Failure and Transplant Cardiology

## 2023-11-03 ENCOUNTER — Ambulatory Visit: Payer: Medicare HMO | Attending: Cardiovascular Disease | Admitting: Cardiovascular Disease

## 2023-11-03 VITALS — BP 134/65 | HR 65 | Temp 97.5°F | Ht 64.0 in | Wt 285.0 lb

## 2023-11-03 DIAGNOSIS — I10 Essential (primary) hypertension: Secondary | ICD-10-CM | POA: Insufficient documentation

## 2023-11-03 DIAGNOSIS — I110 Hypertensive heart disease with heart failure: Secondary | ICD-10-CM

## 2023-11-03 DIAGNOSIS — I5032 Chronic diastolic (congestive) heart failure: Secondary | ICD-10-CM | POA: Insufficient documentation

## 2023-11-03 DIAGNOSIS — I350 Nonrheumatic aortic (valve) stenosis: Secondary | ICD-10-CM | POA: Insufficient documentation

## 2023-11-03 LAB — COMPREHENSIVE METABOLIC PANEL
ALT (GPT): 9 U/L (ref 7–33)
AST (GOT): 11 U/L (ref 9–38)
Albumin: 4 g/dL (ref 3.5–5.2)
Alkaline Phosphatase (Total): 124 U/L (ref 31–132)
Anion Gap: 7 (ref 4–12)
Bilirubin (Total): 0.7 mg/dL (ref 0.2–1.3)
Calcium: 9.2 mg/dL (ref 8.9–10.2)
Carbon Dioxide, Total: 29 meq/L (ref 22–32)
Chloride: 102 meq/L (ref 98–108)
Creatinine: 0.75 mg/dL (ref 0.38–1.02)
Glucose: 99 mg/dL (ref 62–125)
Potassium: 3.8 meq/L (ref 3.6–5.2)
Protein (Total): 7.7 g/dL (ref 6.0–8.2)
Sodium: 138 meq/L (ref 135–145)
Urea Nitrogen: 12 mg/dL (ref 8–21)
eGFR by CKD-EPI 2021: >60 mL/min/{1.73_m2} (ref >59)

## 2023-11-03 LAB — CBC, DIFF
% Basophils: 0 %
% Eosinophils: 1 %
% Immature Granulocytes: 0 %
% Lymphocytes: 29 %
% Monocytes: 4 %
% Neutrophils: 66 %
% Nucleated RBC: 0 %
Absolute Eosinophil Count: 0.13 10*3/uL (ref 0.00–0.50)
Absolute Lymphocyte Count: 3.65 10*3/uL (ref 1.00–4.80)
Basophils: 0.02 10*3/uL (ref 0.00–0.20)
Hematocrit: 39 % (ref 36.0–45.0)
Hemoglobin: 12.9 g/dL (ref 11.5–15.5)
Immature Granulocytes: 0.05 10*3/uL (ref 0.00–0.05)
MCH: 30.1 pg (ref 27.3–33.6)
MCHC: 32.7 g/dL (ref 32.2–36.5)
MCV: 92 fL (ref 81–98)
Monocytes: 0.56 10*3/uL (ref 0.00–0.80)
Neutrophils: 8.31 10*3/uL — ABNORMAL HIGH (ref 1.80–7.00)
Nucleated RBC: 0 10*3/uL
Platelet Count: 274 10*3/uL (ref 150–400)
RBC: 4.29 10*6/uL (ref 3.80–5.00)
RDW-CV: 13.8 % (ref 11.0–14.5)
WBC: 12.72 10*3/uL — ABNORMAL HIGH (ref 4.3–10.0)

## 2023-11-03 LAB — LIPID PANEL
Cholesterol/HDL Ratio: 4.5
HDL Cholesterol: 45 mg/dL (ref >39)
LDL Cholesterol, NIH Equation: 139 mg/dL — ABNORMAL HIGH (ref <130)
Non-HDL Cholesterol: 159 mg/dL (ref 0–159)
Total Cholesterol: 204 mg/dL — ABNORMAL HIGH (ref <200)
Triglyceride: 111 mg/dL (ref <150)

## 2023-11-03 LAB — MAGNESIUM: Magnesium: 2 mg/dL (ref 1.8–2.4)

## 2023-11-03 LAB — B_TYPE NATRIURETIC PEPTIDE: B_Type Natriuretic Peptide: 90 pg/mL (ref <101)

## 2023-11-03 MED ORDER — ENTRESTO 24-26 MG OR TABS
1.0000 | ORAL_TABLET | Freq: Two times a day (BID) | ORAL | 3 refills | Status: AC
Start: 2023-11-03 — End: 2024-11-02

## 2023-11-03 MED ORDER — METOPROLOL SUCCINATE ER 25 MG OR TB24
12.5000 mg | EXTENDED_RELEASE_TABLET | Freq: Every day | ORAL | 3 refills | Status: AC
Start: 2023-11-03 — End: ?

## 2023-11-03 MED ORDER — GLUCOSE BLOOD VI STRP
1.0000 | ORAL_STRIP | Freq: Two times a day (BID) | 1 refills | Status: AC
Start: 2023-11-03 — End: ?

## 2023-11-04 ENCOUNTER — Telehealth (HOSPITAL_BASED_OUTPATIENT_CLINIC_OR_DEPARTMENT_OTHER): Payer: Self-pay | Admitting: Cardiovascular Disease

## 2023-11-04 NOTE — Telephone Encounter (Signed)
 Message  Received: Yesterday  Pamboukian, Salpy Veronica, MD  Lonn Roads, RN  Her lipids are elevated. Can you make sure she is taking her atorvastatin . Thanks      ------------------------------------------------------------------------------------------------------------------------------------------------------

## 2023-11-07 NOTE — Telephone Encounter (Signed)
 Holly Blair has confirmed that she has been taking atorvastatin  40 mg daily

## 2023-11-08 ENCOUNTER — Telehealth (HOSPITAL_BASED_OUTPATIENT_CLINIC_OR_DEPARTMENT_OTHER): Payer: Self-pay

## 2023-11-08 NOTE — Telephone Encounter (Signed)
 Patient checking on status of appointment request. [facility] re-opened referral. Staff busy at time of call.

## 2023-11-08 NOTE — Telephone Encounter (Signed)
RETURN CALL: Voicemail - Detailed Message      SUBJECT:  Appointment Request     REASON FOR VISIT: Per referral   PREFERRED DATE/TIME: Call to discuss  REASON UNABLE TO APPOINT: Please callback to schedule as transfer unavailable, thanks.

## 2023-11-08 NOTE — Telephone Encounter (Signed)
 Pamboukian, Jarvis Mesa, MD to Lonn Roads, RN (Selected Message)  11/07/23  5:27 PM  I would like to increase atorvastatin  to 80mg  daily with repeat lipids and CMP in 6 weeks    Mychart msg to Pt

## 2023-11-10 NOTE — Telephone Encounter (Signed)
 Patient called again requesting to schedule. [facility] unable to appoint per referral.

## 2023-11-15 ENCOUNTER — Telehealth (HOSPITAL_BASED_OUTPATIENT_CLINIC_OR_DEPARTMENT_OTHER): Payer: Self-pay | Admitting: Cardiovascular Disease

## 2023-11-15 ENCOUNTER — Telehealth (HOSPITAL_BASED_OUTPATIENT_CLINIC_OR_DEPARTMENT_OTHER): Payer: Self-pay | Admitting: "Endocrinology

## 2023-11-15 DIAGNOSIS — I5032 Chronic diastolic (congestive) heart failure: Secondary | ICD-10-CM

## 2023-11-15 DIAGNOSIS — E7800 Pure hypercholesterolemia, unspecified: Secondary | ICD-10-CM

## 2023-11-15 MED ORDER — ATORVASTATIN CALCIUM 40 MG OR TABS
80.0000 mg | ORAL_TABLET | Freq: Every day | ORAL | 1 refills | Status: DC
Start: 2023-11-15 — End: 2023-12-05

## 2023-11-15 NOTE — Telephone Encounter (Signed)
 Called patient to confirm that she will have labs drawn.  Patient agreed to obtain local labs on Friday.  Denies symptoms with initiating entresto .  Reports she has been monitoring BP but cannot recall readings.  Agreed to continue monitoring and report to team.    Per Dr Scott Cutting, initiate toprol  xl and stop amlodipine .  Confirmed with patient.    Also confirmed with patient that she has changed her atorvastatin  dose to 80mg  daily per Dr Pamboukian.  CMP/Lipids in 6 weeks             Pharmacy has been changed to [facility] [place]

## 2023-11-15 NOTE — Telephone Encounter (Signed)
 Pt called to request that her prescriptions be sent to    [facility]  [address]  [place]    Phone: [phone]

## 2023-11-16 NOTE — Telephone Encounter (Signed)
 Pharmacy added to patient's profile.Unclear if patient is requesting new Rx or just updating pharmacy at the moment.

## 2023-11-16 NOTE — Telephone Encounter (Signed)
 LVM for pt.

## 2023-11-17 ENCOUNTER — Other Ambulatory Visit (HOSPITAL_BASED_OUTPATIENT_CLINIC_OR_DEPARTMENT_OTHER): Payer: Self-pay | Admitting: "Endocrinology

## 2023-11-17 ENCOUNTER — Other Ambulatory Visit (HOSPITAL_BASED_OUTPATIENT_CLINIC_OR_DEPARTMENT_OTHER): Payer: Self-pay | Admitting: Obstetrics & Gynecology

## 2023-11-17 DIAGNOSIS — E1165 Type 2 diabetes mellitus with hyperglycemia: Secondary | ICD-10-CM

## 2023-11-17 DIAGNOSIS — N3281 Overactive bladder: Secondary | ICD-10-CM

## 2023-11-18 LAB — BASIC METABOLIC PANEL
Anion Gap, External: 10 (ref 4–12)
Calcium, External: 9.4 mg/dL (ref 8.5–10.5)
Carbon Dioxide (Total), External: 27 mmol/L (ref 21–32)
Chloride, External: 103 mmol/L (ref 98–109)
Creatinine, External: 0.92 mg/dL (ref 0.6–1.2)
Glucose, External: 118 mg/dL (ref 65–120)
Potassium, External: 4.3 mmol/L (ref 3.6–5.3)
Sodium, External: 140 mmol/L (ref 135–145)
Urea Nitrogen, External: 18 mg/dL (ref 8–24)
eGFR by CKD EPI 2021, External: 73 mL/min/{1.73_m2} (ref >59)

## 2023-11-21 ENCOUNTER — Encounter (HOSPITAL_BASED_OUTPATIENT_CLINIC_OR_DEPARTMENT_OTHER): Payer: Self-pay

## 2023-11-21 MED ORDER — BLOOD GLUCOSE MONITORING SUPPL KIT
PACK | Freq: Two times a day (BID) | 1 refills | Status: AC
Start: 2023-11-21 — End: ?

## 2023-11-21 MED ORDER — LANCETS THIN MISC
1.0000 | Freq: Two times a day (BID) | 3 refills | Status: DC
Start: 2023-11-21 — End: 2024-04-26

## 2023-11-21 MED ORDER — MIRABEGRON ER 50 MG OR TB24
50.0000 mg | EXTENDED_RELEASE_TABLET | Freq: Every day | ORAL | 1 refills | Status: AC
Start: 2023-11-21 — End: ?

## 2023-11-21 NOTE — Telephone Encounter (Addendum)
 To provider to review. Last here 08/2022 and no exams since.

## 2023-11-21 NOTE — Telephone Encounter (Signed)
 11/21/23 -- no lab results ( CMP and lipids )  are found as of today

## 2023-11-22 ENCOUNTER — Other Ambulatory Visit (HOSPITAL_BASED_OUTPATIENT_CLINIC_OR_DEPARTMENT_OTHER): Payer: Self-pay | Admitting: Cardiovascular Disease

## 2023-11-22 DIAGNOSIS — I10 Essential (primary) hypertension: Secondary | ICD-10-CM

## 2023-11-22 DIAGNOSIS — I5032 Chronic diastolic (congestive) heart failure: Secondary | ICD-10-CM

## 2023-11-22 DIAGNOSIS — I350 Nonrheumatic aortic (valve) stenosis: Secondary | ICD-10-CM

## 2023-11-22 NOTE — Telephone Encounter (Signed)
 Warm transfer from front desk  TC with [place]  Pt had labs drawn on 5/29 - BMP only, as pt had just started Entresto .       [facility]    [phone] phone  [phone] fax  [phone] client service dept. For results; Asa Lauth will fax results today. 6/3.        Sent CMP/Lipid panel order to mychart.   Pt will get these labs week of July 14th.

## 2023-11-22 NOTE — Telephone Encounter (Signed)
 6/3 - no lab results - sent mychart message with lab orders CMP and lipid panel

## 2023-11-25 ENCOUNTER — Other Ambulatory Visit (HOSPITAL_BASED_OUTPATIENT_CLINIC_OR_DEPARTMENT_OTHER): Payer: Self-pay | Admitting: Cardiovascular Disease

## 2023-11-25 DIAGNOSIS — I350 Nonrheumatic aortic (valve) stenosis: Secondary | ICD-10-CM

## 2023-11-25 DIAGNOSIS — I5030 Unspecified diastolic (congestive) heart failure: Secondary | ICD-10-CM

## 2023-11-25 DIAGNOSIS — I10 Essential (primary) hypertension: Secondary | ICD-10-CM

## 2023-11-25 DIAGNOSIS — E782 Mixed hyperlipidemia: Secondary | ICD-10-CM

## 2023-11-25 NOTE — Telephone Encounter (Signed)
 External BMP results  from 11/19/23 are normal after starting on Entresto  24/26 mg BID       Her next return visit 11/6 25

## 2023-11-28 ENCOUNTER — Telehealth (HOSPITAL_BASED_OUTPATIENT_CLINIC_OR_DEPARTMENT_OTHER): Payer: Self-pay | Admitting: Cardiovascular Disease

## 2023-11-28 MED ORDER — SPIRONOLACTONE 25 MG OR TABS
12.5000 mg | ORAL_TABLET | Freq: Every day | ORAL | 3 refills | Status: AC
Start: 2023-11-28 — End: 2024-11-27

## 2023-11-28 NOTE — Telephone Encounter (Signed)
 Follow up from Dr Etha Henle visit/ most recent message.    Called patient - no answer.  LVM and Mychart message with instructions to start metoprolol  succ and discontinue amlodipine .  Follow up appointments scheduled in November

## 2023-11-29 ENCOUNTER — Encounter (HOSPITAL_BASED_OUTPATIENT_CLINIC_OR_DEPARTMENT_OTHER): Payer: Self-pay | Admitting: "Endocrinology

## 2023-11-29 ENCOUNTER — Ambulatory Visit: Payer: Medicare HMO | Attending: "Endocrinology | Admitting: "Endocrinology

## 2023-11-29 VITALS — BP 125/78 | HR 69 | Ht 64.0 in | Wt 285.2 lb

## 2023-11-29 DIAGNOSIS — E1165 Type 2 diabetes mellitus with hyperglycemia: Secondary | ICD-10-CM | POA: Insufficient documentation

## 2023-11-29 DIAGNOSIS — I5032 Chronic diastolic (congestive) heart failure: Secondary | ICD-10-CM | POA: Insufficient documentation

## 2023-11-29 DIAGNOSIS — Z6841 Body Mass Index (BMI) 40.0 and over, adult: Secondary | ICD-10-CM | POA: Insufficient documentation

## 2023-11-29 LAB — HEMOGLOBIN A1C, RAPID: Hemoglobin A1C: 6.2 % — ABNORMAL HIGH (ref 4.0–5.6)

## 2023-11-29 MED ORDER — OZEMPIC (2 MG/DOSE) 8 MG/3ML SC SOPN
2.0000 mg | PEN_INJECTOR | SUBCUTANEOUS | 3 refills | Status: DC
Start: 2023-11-29 — End: 2024-04-05

## 2023-11-29 NOTE — Patient Instructions (Addendum)
 Increase-  Ozempic  to 2 mg weekly    Continue-  Jardiance  10 mg daily    A1c POCT today.   RTC in 4 months.     --------------------------------------------     Clinic Location:    [facility]  [address],  [place],  [place], [place] [address]  Phone: [phone]  Fax: [phone]    Test results, prescriptions, and questions:    [facility] MyChart allows you to view your test results, send non-urgent messages to your doctor, renew your prescriptions, schedule appointments, and more. If you do not already have an enrollment code, please contact our office.     Your blood tests and other study results should be completed, interpreted and conveyed to you within 1-2 weeks. If your results are urgent, we will contact you immediately. If you have not heard from us  in two weeks, please contact us  or send a message through MyChart.

## 2023-11-29 NOTE — Progress Notes (Signed)
 Follow up Patient HPI-    Patient seen for follow up of diabetes type II management.    Diagnosis of Diabetes: Many years back.  She has a home caregiver who comes twice a week.  She does not drive and needs to arrange a ride to get to clinic.    Since last visit,   Weight stable.     List Current Medications for Glycemic control and the doses:  Jardiance  10 mg daily  Ozempic  1 mg weekly       She reports that she is allergic to metformin .    A1c - 6.2% checked 11/2023.    SMBG (self monitored blood glucose) readings: BG meter. Libre not covered.   Hypoglycemia -denies any symptoms.    No blood sugar data!    - Eyes: Seen at [facility] in 09/2023.   - Kidneys: On SGLT2 inhibitors.  Creatinine 0.92 and GFR more than 60 checked 10/2023.    Diet: Improved.   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    Has heart failure.     Review of system-  Except as noted in HPI, the remainder of complete review of systems is otherwise negative.     ______________________________________________________________________      Past Medical History:   Diagnosis Date    Acne     Anxiety state, unspecified     Arthritis     Cataract     Chronic back pain     Chronic knee pain     Depression, major, recurrent (HCC)     Heart murmur     Hernia     Obesity        Past Surgical History:   Procedure Laterality Date    APPENDECTOMY      PR LIG/TRNSXJ FLP TUBE ABDL/VAG APPR UNI/BI         Family History       Problem (# of Occurrences) Relation (Name,Age of Onset)    Cancer (1) Mother: lung cancer, long history of smkoing    Hypertension (1) Maternal Grandfather    Colorectal Cancer (1) Maternal Grandmother           Negative family history of: Uterine Cancer, Prolapse, Incontinence            Social History     Tobacco Use    Smoking status: Never    Smokeless tobacco: Never   Substance Use Topics    Alcohol use: Never    Drug use: Never         Current Outpatient Medications   Medication Sig Dispense Refill    atorvastatin  40 MG tablet Take 2  tablets (80 mg) by mouth daily. 90 tablet 1    blood glucose monitoring supply kit Use 2 times a day. Use to check blood sugar. 1 kit 1    blood glucose test strip Use 1 strip 2 times a day. Use to check blood sugar. 100 strip 1    buPROPion  XL 150 MG 24 hr tablet Take 1 tablet (150 mg) by mouth daily. Take with 300 mg tablet for total daily dose of 450 mg. 90 tablet 1    buPROPion  XL 300 MG 24 hr tablet Take 1 tablet (300 mg) by mouth daily. Take with 150 mg tablet for total daily dose of 450 mg. 90 tablet 1    clotrimazole  1 % cream Apply topically 2 times a day. For fungal rash. 45 g 2    empagliflozin  (Jardiance ) 10 MG tablet  Take 1 tablet (10 mg) by mouth every morning. 90 tablet 1    FLUoxetine  20 MG capsule Take 4 capsules (80 mg) by mouth daily. Dose changed on 11/16/22 360 capsule 2    FreeStyle Libre 3 Plus Sensor Apply sensor to the back of upper arm as instructed to continuously monitor blood sugar. Remove and replace every 15 days. (Patient not taking: Reported on 11/03/2023) 6 each 3    FreeStyle Libre 3 Reader Use with FreeStyle Libre 3 Sensor for continuous glucose monitoring. (Patient not taking: Reported on 11/03/2023) 1 each 0    furosemide  20 MG tablet Take 1 tablet (20 mg) by mouth daily. 90 tablet 1    glucometer (OneTouch Verio Flex System) w/Device kit Use 2 times a day. Use to check blood sugar.      lancets thin Use 1 each 2 times a day. 200 each 3    metoprolol  succinate ER 25 MG 24 hr tablet Take 0.5 tablets (12.5 mg) by mouth daily. 90 tablet 3    mirabegron  ER (Myrbetriq ) 50 MG 24 hr tablet Take 1 tablet (50 mg) by mouth daily. 90 tablet 1    sacubitril-valsartan (Entresto ) 24-26 MG tablet Take 1 tablet by mouth 2 times a day. 180 tablet 3    semaglutide  (Ozempic ) 1 mg dose (4 mg/3 mL) pen-injector Inject 1 mg under the skin one time a week. 9 mL 3    sharps container Use as instructed. 1 each 1    spironolactone  25 MG tablet Take 0.5 tablets (12.5 mg) by mouth daily. Take with breakfast  45 tablet 3    triamcinolone  0.1 % cream Apply to affected area on leg(s) 2 times a day. 1 Tube 3     No current facility-administered medications for this visit.       Review of patient's allergies indicates:  Allergies   Allergen Reactions    Metformin  Dizziness and HP:Wjldzj/[id number]         _________________________________________________________________    BP 125/78   Pulse 69   Ht 5' 4 (1.626 m) Comment: Pt Reported  Wt (!) 129.4 kg (285 lb 3.2 oz)   LMP 03/31/2017 (Within Days)   BMI 48.95 kg/m    Body mass index is 48.95 kg/m.       Physical exam-    Constitutional: Is oriented to person, place, and time. Appears well-developed and well-nourished. No distress.   Head: Normocephalic and atraumatic.  Eyes: Conjunctivae are normal. Pupils are reactive to light. Right eye exhibits no discharge. Left eye exhibits no discharge.   Neck: Normal range of motion. Neck supple.     Cardiovascular: Normal rate, regular rhythm and normal heart sounds.   Pulmonary: Effort normal and breath sounds normal.    Musculoskeletal: Normal range of motion. Exhibits no edema.   Neurological: Is alert and oriented to person, place, and time.  Skin: Skin is warm. Is not diaphoretic. No erythema.   Psychiatric: Has a normal mood and affect. Behavior is normal.      _____________________________________________________________________    Labs-        Latest Ref Rng & Units 04/21/2023     1:20 PM 04/26/2023    12:59 PM 04/26/2023     1:07 PM 08/02/2023     2:36 PM 11/03/2023     2:46 PM 11/03/2023     2:54 PM 11/29/2023     2:58 PM   DIABETES TRACKING   Weight  303 lb  306 lb 9.6 oz 287  lb  285 lb 285 lb 3.2 oz   BP  156/78  137/64 120/70  134/65 125/78   Total Glycated Hemoglobin 4.0 - 5.6 %  7.7         HDL Cholesterol >39 mg/dL     45      Cholesterol (LDL) <130 mg/dL     860           Lab Results   Component Value Date    TSH 2.829 12/23/2020    TSH 2.440 02/09/2017       Reviewed relevant labs in Epic.    Lab Results   Component  Value Date    A1C 7.7 (H) 04/26/2023    A1C Wrong test ordered by practitioner 11/13/2022    A1C 12.1 (H) 11/13/2022     Lab Results   Component Value Date    CHOLESTEROL 204 (H) 11/03/2023    CHOLESTEROL 136 11/13/2022    CHOLESTEROL 177 02/02/2021    TRIGLYCERIDE 111 11/03/2023    TRIGLYCERIDE 103 11/13/2022    TRIGLYCERIDE 108 02/02/2021    LDL 139 (H) 11/03/2023    LDL 60 11/13/2022    LDL 111 02/02/2021    HDL 45 11/03/2023    HDL 57 11/13/2022    HDL 44 02/02/2021    CHOLHDLRATIO 4.5 11/03/2023    CHOLHDLRATIO 2.4 11/13/2022    CHOLHDLRATIO 4.0 02/02/2021     Lab Results   Component Value Date    TSH 2.829 12/23/2020    TSH 2.581 02/23/2019    TSH 2.440 02/09/2017     Lab Results   Component Value Date    GLUCOSE 118 11/18/2023    SODIUM 140 11/18/2023    POTASSIUM 4.3 11/18/2023    CL 103 11/18/2023    CO2 27 11/18/2023    BUN 18 11/18/2023    CREATININE 0.92 11/18/2023    GFR 73 11/18/2023    PROTEIN 7.7 11/03/2023    ALBUMIN 4.0 11/03/2023    ALT 9 11/03/2023    AST 11 11/03/2023    ALK 124 11/03/2023    CA 9.4 11/18/2023    MAGNESIUM 2.0 11/03/2023    VITD 21.3 02/09/2017    HEMOGLOBIN 12.9 11/03/2023     No results found for: ALBCREATRATU    _____________________________________________________________________    Assessment/Recommendations-    Holly Blair was seen today for diabetes and follow-up .    Diagnoses and all orders for this visit:    Type 2 diabetes mellitus with hyperglycemia, without long-term current use of insulin (HCC)  BMI 45.0-49.9, adult (HCC)  Chronic heart failure with preserved ejection fraction [facility])    Patient with well controlled type 2 diabetes. Has BMI 48 and interested in promoting more weight loss.   Increase-  Ozempic  to 2 mg weekly    Continue-  Jardiance  10 mg daily    A1c POCT today.   RTC in 4 months.           _____________________________________________________________________    After visit summary given to patient.    Increase-  Ozempic  to 2 mg  weekly    Continue-  Jardiance  10 mg daily    A1c POCT today.   RTC in 4 months.       Patient established with us  and will continue to follow up for ongoing management of chronic medical problems mentioned above.   I spent a total of > 30 minutes for the patient's care on the date of the service.

## 2023-11-30 NOTE — Telephone Encounter (Signed)
 Pamboukian, Salpy Veronica, MD to Me (Selected Message)        11/28/23  8:27 AM  No changes. thanks

## 2023-12-05 ENCOUNTER — Telehealth (HOSPITAL_BASED_OUTPATIENT_CLINIC_OR_DEPARTMENT_OTHER): Payer: Self-pay | Admitting: "Endocrinology

## 2023-12-05 ENCOUNTER — Telehealth (HOSPITAL_BASED_OUTPATIENT_CLINIC_OR_DEPARTMENT_OTHER): Payer: Self-pay | Admitting: Cardiovascular Disease

## 2023-12-05 DIAGNOSIS — Z6841 Body Mass Index (BMI) 40.0 and over, adult: Secondary | ICD-10-CM

## 2023-12-05 DIAGNOSIS — I5032 Chronic diastolic (congestive) heart failure: Secondary | ICD-10-CM

## 2023-12-05 DIAGNOSIS — E7800 Pure hypercholesterolemia, unspecified: Secondary | ICD-10-CM

## 2023-12-05 DIAGNOSIS — E1165 Type 2 diabetes mellitus with hyperglycemia: Secondary | ICD-10-CM

## 2023-12-05 NOTE — Telephone Encounter (Signed)
 RETURN CALL: Voicemail - Detailed Message      SUBJECT:  Refill Request     NAME OF MEDICATION(S): Atorvastatin  80 MG  DATE NEEDED BY: 12/19/2023  PRESCRIBING PROVIDER: Pamboukian, Salpy, MD  PHARMACY NAME/LOCATION: [facility]  ADDITIONAL INFORMATION: Nurse with [place] [facility] states that patient said there was a change in dose to 80 MG. Patient will not need medication for a couple weeks.Please call for any questions.

## 2023-12-05 NOTE — Telephone Encounter (Signed)
 RETURN CALL: Voicemail - Detailed Message      SUBJECT:  Refill Request     NAME OF MEDICATION(S): Ozempic   DATE NEEDED BY: 12/19/2023  PRESCRIBING PROVIDER: Theressa Counts  PHARMACY NAME/LOCATION: [facility]  ADDITIONAL INFORMATION: [facility] nurse states patient is asking if prescription can be written for a 100 day supply. Please call for any questions.

## 2023-12-05 NOTE — Telephone Encounter (Signed)
 Already given 90 days Rx. 100-day not appropriate cannot break package

## 2023-12-07 MED ORDER — ATORVASTATIN CALCIUM 80 MG OR TABS
80.0000 mg | ORAL_TABLET | Freq: Every day | ORAL | 0 refills | Status: DC
Start: 2023-12-07 — End: 2024-04-19

## 2023-12-29 NOTE — Patient Instructions (Addendum)
 Today, you were evaluated for Transcatheter Aortic Valve Replacement (TAVR) at the [facility]. This evaluation process can be complex and may require multiple tests to ensure that you are being treated appropriately and safely.     You were seen by:  Interventional Cardiologist: Dr. Rea      Cardiac Surgeon: Dr. DeRoo    Additional pre-procedure assessments:    [x]  CT Scan   []  Pre-Anesthesia Health Assessment (PHA) Questionnaire   []  KCCQ Questionnaire  []  ECG  []  Heart Team Meeting review  --> Our providers will formally review your case & imaging are our next Heart Team meeting and determine whether open heart surgery or a transcatheter procedure is more appropriate for you. An RN on our team will contact you afterward with the decision.      [place] VISITOR POLICY  Updated 01/06/2021    [facility] has implemented restricted visitation to prevent the spread of COVID-19. Patients are allowed one to two visitors per day in the hospital. Only one visitor per patient is allowed in the Emergency Department, Operating Room waiting areas and for patients located in double occupancy rooms.    Visitors will not be allowed to enter the facility if they have any new COVID-like symptoms or high-risk exposures within the last 10 days.  Everyone over the age of 2 must wear an approved mask at all times covering their nose and mouth. If needed, one will be provided upon entry. Visitors who do not follow our masking policy may be asked to leave the building.  To maximize safety, visitors may not consume food or beverages in lobbies, waiting areas, shared patient spaces (patient rooms with more than 1 patient or patient care spaces separated by curtains), or when healthcare workers are present in the patient care space.  Visitors may schedule virtual visits by contacting the patient's nurse who will facilitate a visit.        PROCEDURE DETAILS       PROCEDURE:  Transcatheter Aortic Valve Replacement  (TAVR) with Dr. Rea (Interventional Cardiologist) and Dr. Deedee (Cardiac Surgeon)    DATE: TBD    CHECK-IN TIME: TBD    WHERE:  [facility] (2nd floor, across from Radiology)    [facility]    [address]    [place], [place] [address]      Please arrive at the check in time assigned to you, we are not able to bring you back to the pre-op area if you arrive before your check in time due to limited staffing and beds. While we want you to arrive at your scheduled time, you may still need to wait to be brought back to the pre-op area, especially if earlier cases take longer than expected or there are any emergencies. In extreme cases this wait can be as long as 2-7 hours, so please come prepared with something to help keep you occupied. We understand that many patients do need to wait and we are doing our best to manage patient, provider and staff needs to treat as many patients as we can.      PRE-PROCEDURE INSTRUCTIONS    Medications:  Jardiance  (empagliflozin ) - STOP taking 3 days (72 hours) before procedure  DO NOT take any of your oral home medications or inject Ozempic  on the morning of your procedure (inhalers OK to use, if applicable)  CLEAR LIQUIDS ONLY starting 24 hours before check in    Examples of clear  liquids include:   Water   Soda   Electrolyte drinks (Gatorade, Pedialyte)   Broth   Clear fruit juices without pulp   Coffee and tea (without milk, cream, or creamer)   Ensure Clear or Boost Breeze   Gelatin    Take TWO pre-op showers with chlorhexidine (CHG) antibacterial soap (directions below)  Evening before (#1) and morning of procedure (#2)  Wash from the NECK down only (OK to use your own shampoo and face products)  Leave the soap on your skin for 60 seconds before rinsing  Do not apply any products on your face, skin, or hair (i.e., deodorant, lotions, makeup) afterward.  Wear clean, loose clothing when coming to the hospital  Please avoid shaving for 48 hours  prior to your procedure  Leave jewelry/valuables at home or with your family  Bring:   Photo ID & insurance card(s)  Current medication list (you do not need to bring the pills or bottles)  Inhalers (if applicable)  Eyeglasses, dentures, and/or hearing aids (with the cases)  CPAP or BiPAP machine and mask (if applicable)  Legal paperwork (i.e., Durable Power of Healthcare Attorney)        POST-PROCEDURE PRECAUTIONS    Incision Care  Bruising at or near the puncture sites is common after the procedure, but check frequently for any bleeding (external or internal), swelling, or signs of infection (redness, heat, drainage)  You may shower the following day, but avoid soaking in water (i.e., bathing, sitting in a hot tub, or swimming) until 2 days after your procedure.   Any bandages can be removed 48 hours after your procedure (remove in the shower or with a warm cloth to avoid tugging over the site).     Activity Precautions  No driving for 2 days after the procedure. Please pre-arrange a ride home after discharge.   No lifting, pushing or pulling anything heavier than 5-10 pounds for 2 weeks after the procedure.  Avoid straining during bowel movements (consider purchasing stool softeners to help with this).  WHAT IS OK? Light walking, going up/down stairs, light chores    Home Monitoring  We highly recommend checking your vital signs (blood pressure, heart rate, and weight) daily for at least two weeks after your procedure.  Keep a log and bring it to your follow up visit  Please purchase these tools (if you don't already have them):  Automatic blood pressure cuff  Scale    Dental Work  Patients should avoid dental work for the first 6 months following your procedure. After that time period, you will require antibiotics prior to any dental work for the life of the valve. Per the American Dental Association, your dentist can prescribe these antibiotics for you.      FOLLOW UP APPOINTMENTS    Your follow up appointments  with the Structural Heart team will be at 1 month and 1 year after your procedure. At these visits, you will have an echocardiogram first and an office visit to follow. Please let us  know if you have any concerns about this visit.      CARDIAC REHAB    Our team will send a referral for you to join a supervised rehab program. Exercises will be prescribed to help you build strength and movement. The first month will most likely include easier exercises. Over time, you'll exercise harder to improve your endurance. Your heart, oxygen saturation, and blood pressure may be monitored as you work.  Cardiac rehab programs are tailored  to meet your needs. Some people may participate in the program for 6 weeks, while others will participate 6 months or longer. Before leaving the hospital, your healthcare provider can provide contact and enrollment information.      For Questions or Concerns, Contact:  Structural Heart Nurse Line: [phone]  Clinic Hours (Mon - Fri 8:00 am to 4:30 pm)  Emergency/After Hours Line: [phone]  Follow the prompts to speak with a triage nurse for a new or worsening symptom. An on-call provider may be contacted if necessary.

## 2024-01-02 ENCOUNTER — Telehealth (HOSPITAL_BASED_OUTPATIENT_CLINIC_OR_DEPARTMENT_OTHER): Payer: Self-pay | Admitting: Cardiovascular Disease

## 2024-01-02 NOTE — Telephone Encounter (Signed)
 Called and spoke with Holly Blair reminding to stop by outpatient blood draw to get lipid and CMP when she comes for her other provider appointment tomorrow 01/03/24    Her atorvastatin  was increased to 80 mg daily ~12/07/23

## 2024-01-03 ENCOUNTER — Encounter (HOSPITAL_BASED_OUTPATIENT_CLINIC_OR_DEPARTMENT_OTHER): Payer: Self-pay | Admitting: Internal Medicine

## 2024-01-03 ENCOUNTER — Ambulatory Visit (HOSPITAL_COMMUNITY): Payer: Self-pay

## 2024-01-03 ENCOUNTER — Ambulatory Visit (HOSPITAL_BASED_OUTPATIENT_CLINIC_OR_DEPARTMENT_OTHER): Admitting: Thoracic Surgery (Cardiothoracic Vascular Surgery)

## 2024-01-03 ENCOUNTER — Other Ambulatory Visit (HOSPITAL_BASED_OUTPATIENT_CLINIC_OR_DEPARTMENT_OTHER): Payer: Self-pay | Admitting: Cardiovascular Disease

## 2024-01-03 ENCOUNTER — Ambulatory Visit: Attending: Physician Assistant | Admitting: Internal Medicine

## 2024-01-03 VITALS — BP 116/64 | HR 67 | Temp 97.0°F | Ht 64.0 in | Wt 282.2 lb

## 2024-01-03 DIAGNOSIS — I350 Nonrheumatic aortic (valve) stenosis: Secondary | ICD-10-CM

## 2024-01-03 DIAGNOSIS — I5032 Chronic diastolic (congestive) heart failure: Secondary | ICD-10-CM | POA: Insufficient documentation

## 2024-01-03 DIAGNOSIS — I351 Nonrheumatic aortic (valve) insufficiency: Secondary | ICD-10-CM | POA: Insufficient documentation

## 2024-01-03 DIAGNOSIS — E7800 Pure hypercholesterolemia, unspecified: Secondary | ICD-10-CM

## 2024-01-03 LAB — LIPID PANEL
Cholesterol/HDL Ratio: 3.4
HDL Cholesterol: 49 mg/dL (ref >39)
LDL Cholesterol, NIH Equation: 102 mg/dL (ref <130)
Non-HDL Cholesterol: 116 mg/dL (ref 0–159)
Total Cholesterol: 165 mg/dL (ref <200)
Triglyceride: 75 mg/dL (ref <150)

## 2024-01-03 LAB — COMPREHENSIVE METABOLIC PANEL
ALT (GPT): 11 U/L (ref 7–33)
AST (GOT): 11 U/L (ref 9–38)
Albumin: 3.9 g/dL (ref 3.5–5.2)
Alkaline Phosphatase (Total): 110 U/L (ref 31–132)
Anion Gap: 6 (ref 4–12)
Bilirubin (Total): 0.7 mg/dL (ref 0.2–1.3)
Calcium: 9 mg/dL (ref 8.9–10.2)
Carbon Dioxide, Total: 30 meq/L (ref 22–32)
Chloride: 102 meq/L (ref 98–108)
Creatinine: 0.9 mg/dL (ref 0.38–1.02)
Glucose: 104 mg/dL (ref 62–125)
Potassium: 3.9 meq/L (ref 3.6–5.2)
Protein (Total): 7.5 g/dL (ref 6.0–8.2)
Sodium: 138 meq/L (ref 135–145)
Urea Nitrogen: 11 mg/dL (ref 8–21)
eGFR by CKD-EPI 2021: >60 mL/min/1.73_m2 (ref >59)

## 2024-01-03 NOTE — Progress Notes (Signed)
 [facility] - NEW PATIENT VISIT    Date of visit: 01/03/2024  Primary Care Physician: Keehn, Elan Cary, ARNP  Referring Provider: Caron Royal Glory*    Chief complaint: aortic regurgitation    History of Presenting Illness:  Holly Blair is a 58 year old female with a history of 58 year old year-old woman who returns for follow up of HFpEF, mild AS, OSA, obesity, PH, HTN and HLD who presents for evaluation of AVR. Her Aunt joined us  via phone for the visit.    Followed by Dr. Pamboukian, here in valve clinic for evaluation of mild moderate AR with dilated LVIDd 5.3, LVIDs 3.6cm, and noted EF of 55%, which had decreased from 70% on a prior echo. She has mild AS with valve area 1.48mm2.    Walks with a walker due to falls, overall has not noticed any changes in her functional status. Notices some shortness of breath if  she is in a hurry, does water aerobics and swimming at the [facility] and goes once every two weeks without issue. Has lost 40 lbs on ozempic .    Denies chest pain, LE edema, PND, palpitations, SOB, significant DOE.        Past Medical History:  Patient Active Problem List    Diagnosis Date Noted    Mild aortic stenosis [I35.0] 07/05/2022    OSA (obstructive sleep apnea) [G47.33] 05/17/2022    Heart failure with preserved ejection fraction (HCC) [I50.30] 02/05/2019    Pulmonary HTN (HCC) [I27.20] 09/07/2018    Prediabetes [R73.03] 09/07/2018    Rosacea [L71.9] 07/28/2018    Essential hypertension [I10] 07/28/2018    Anxiety [F41.9] 02/09/2017    Systolic murmur [R01.1] 01/03/2015    Morbid obesity with BMI of 50.0-59.9, adult (HCC) [E66.01, Z68.43] 10/17/2014    Hyperlipidemia [E78.5] 10/17/2014    Senile nuclear cataract [H25.10] 07/13/2011    Myopia with astigmatism and presbyopia [H52.10, H52.209, H52.4] 07/13/2011    Depression, major, recurrent [F33.9]     Chronic knee pain [M25.569, G89.29]     Chronic back pain [M54.9, G89.29]        Past Surgical  History:      Allergies:  Review of patient's allergies indicates:  Allergies   Allergen Reactions    Metformin  Dizziness and HP:Wjldzj/[id number]       Outpatient Medications:  Current Outpatient Medications   Medication Sig Dispense Refill    atorvastatin  80 MG tablet Take 1 tablet (80 mg) by mouth daily. Dose changed on 11/15/23 90 tablet 0    blood glucose monitoring supply kit Use 2 times a day. Use to check blood sugar. 1 kit 1    blood glucose test strip Use 1 strip 2 times a day. Use to check blood sugar. 100 strip 1    buPROPion  XL 150 MG 24 hr tablet Take 1 tablet (150 mg) by mouth daily. Take with 300 mg tablet for total daily dose of 450 mg. 90 tablet 1    buPROPion  XL 300 MG 24 hr tablet Take 1 tablet (300 mg) by mouth daily. Take with 150 mg tablet for total daily dose of 450 mg. 90 tablet 1    clotrimazole  1 % cream Apply topically 2 times a day. For fungal rash. 45 g 2    empagliflozin  (Jardiance ) 10 MG tablet Take 1 tablet (10 mg) by mouth every morning. 90 tablet 1    FLUoxetine  20 MG capsule Take 4 capsules (80 mg) by mouth daily. Dose changed on  11/16/22 360 capsule 2    FreeStyle Libre 3 Plus Sensor Apply sensor to the back of upper arm as instructed to continuously monitor blood sugar. Remove and replace every 15 days. (Patient not taking: Reported on 11/03/2023) 6 each 3    FreeStyle Libre 3 Reader Use with FreeStyle Libre 3 Sensor for continuous glucose monitoring. (Patient not taking: Reported on 11/03/2023) 1 each 0    furosemide  20 MG tablet Take 1 tablet (20 mg) by mouth daily. 90 tablet 1    glucometer (OneTouch Verio Flex System) w/Device kit Use 2 times a day. Use to check blood sugar.      lancets thin Use 1 each 2 times a day. 200 each 3    metoprolol  succinate ER 25 MG 24 hr tablet Take 0.5 tablets (12.5 mg) by mouth daily. 90 tablet 3    mirabegron  ER (Myrbetriq ) 50 MG 24 hr tablet Take 1 tablet (50 mg) by mouth daily. 90 tablet 1    sacubitril-valsartan (Entresto ) 24-26 MG tablet Take 1  tablet by mouth 2 times a day. 180 tablet 3    semaglutide  (Ozempic ) 2 mg dose (8 mg/3 mL) pen-injector Inject 2 mg under the skin one time a week. 9 mL 3    sharps container Use as instructed. 1 each 1    spironolactone  25 MG tablet Take 0.5 tablets (12.5 mg) by mouth daily. Take with breakfast 45 tablet 3    triamcinolone  0.1 % cream Apply to affected area on leg(s) 2 times a day. 1 Tube 3     No current facility-administered medications for this visit.       Social History:  lives    Family History:  Family History       Problem (# of Occurrences) Relation (Name,Age of Onset)    Cancer (1) Mother: lung cancer, long history of smkoing    Hypertension (1) Maternal Grandfather    Colorectal Cancer (1) Maternal Grandmother           Negative family history of: Uterine Cancer, Prolapse, Incontinence            Review of Systems:  Negative except for above    Physical Exam:  LMP 03/31/2017 (Within Days)   GEN: Well-appearing, in no acute distress.  NEURO/PSYCH: No focal motor or sensory deficits. Normal mood and affect.   HEENT: No JVD.   PULM: Clear lungs bilaterally with normal respiratory effort, no wheezing/crackles.  CV: Regular rate and rhythm, 2/6 systolic murmur at R sternal border  ABD: Soft, non-tender, non-distended.  EXT: Warm, no peripheral edema.   SKIN: Warm, no open sores or rashes.     Data:  Lab Studies:  Results for orders placed or performed in visit on 11/22/23   Basic Metabolic Panel    Collection Time: 11/18/23 12:00 AM   Result Value Ref Range    Sodium, External 140 135 - 145 mmol/L    Potassium, External 4.3 3.6 - 5.3 mmol/L    Chloride, External 103 98 - 109 mmol/L    Carbon Dioxide (Total), External 27 21 - 32 mmol/L    Anion Gap, External 10 4 - 12    Glucose, External 118 65 - 120 mg/dL    Urea Nitrogen, External 18 8 - 24 mg/dL    Creatinine, External 0.92 0.6 - 1.2 mg/dL    Calcium , External 9.4 8.5 - 10.5 mg/dL    eGFR by CKD EPI 7978, External 73 >59 mL/min/1.58m2     No  results found  for this or any previous visit.    EKG: NSR, LAD, poor R wave progression, Q waves in inferior leads    Transthoracic Echocardiogram:   The left ventricle is moderately dilated. The left ventricular indexed volume  is 79ml/m2. There is normal left ventricular wall thickness. No thrombus seen  in the left ventricle. Global left ventricular systolic function is normal.  The calculated ejection fraction, as determined by the biplane method of  disks, is 55%. No regional wall motion abnormalities are present. Diastolic  function could not be accurately assessed due to significant mitral annular  calcification.      The right ventricle is normal size. The basal right ventricular diameter  measured from a right ventricular focused view is 4.1 cm. The right  ventricular systolic function is normal.     The left atrium is mildly dilated. Right atrial size is normal.  The aortic valve is trileaflet and mildly thickened. There is mild valvular  aortic stenosis, AV V,ax 291 cm/sec, mean PG , DVI 0.56, AVA 1.6 cm2.  Mild to moderate aortic regurgitation also noted.  The mitral valve leaflets are mildly thickened. Moderate mitral annular  calcification is present. Mild mitral regurgitation.   Trace tricuspid regurgitation.     The pulmonary artery systolic pressure is normal, PASP 21-73mmHg. Normal  right atrial pressures.      No pericardial effusion.      Compared to prior, there is dilation of the left ventricle (previously normal  sized). LVEF previously 75% vs. 55% today. Unchanged mild calcific aortic  valvular stenosis and mild-moderate aortic regurgitation. Worsening in the  severity of  mitral annular calcification without stenosis noted today.  Today's study was compared to prior echo study performed on 07/24/2019      Assessment:  Nakeia Calvi is a 58 year old female with a history of HFpEF, mild AS, OSA, obesity, PH, HTN and HLD who presents for evaluation of AVR in the setting of mild to moderate AR. Her  Aunt joined us  via phone for the visit. Overall she is feeling well and her AR appears moderate on TTE. Her LV internal dimensions are unchaged from prior TTE although her EF has decreased from 70%-55%. We will further evaluate the degree of AR with a cardiac MRI.    Plan:  -Cardiac MRI to better quantify the extent of aortic regurgitation  -if AR is not in the severe range can likely continue to monitor closely for symptoms and progression  -telephone visit follow up after cardiac MRI      Patient seen and discussed with attending cardiologist, Dr. Rea, whose addendum will follow.    Milo Bode, MD  Cardiology Fellow, PGY-VI  [facility]

## 2024-01-03 NOTE — Progress Notes (Signed)
 5 Meter Walk Test    1. 5 Meter (15 feet) Gait Speed     Instructions:  Patient instructed to walk at a comfortable pace and should use their assistive device.    Start timer at the first footfall and end timer with the last footfall of 5 meters (15 feet).  [facility] clinic hall (to rear exam rooms) is marked with 5 meter intervals.     Repeat 3 times, allowing sufficient time for recuperation between trials    (Average of > 6 seconds is frail).  Record in seconds.    Trial 1: 8.44  Trial 2: 12.31  Trial 3: 9.76  Average:10.17     GRIP STRENGTH     Grip Strength of Dominant Hand      (Average for female < 30 kg; women < 17 kg dominant hand is frail)    Elbow should be at a 90 degree angle with arm NOT resting on table or "pinned" against chest wall.      Dominant hand: LEFT  Trial 1: 25  Trial 2: 20  Trial 3: 20  Average: 21.67

## 2024-01-04 ENCOUNTER — Other Ambulatory Visit (HOSPITAL_COMMUNITY): Payer: Self-pay | Admitting: Student in an Organized Health Care Education/Training Program

## 2024-01-04 DIAGNOSIS — I351 Nonrheumatic aortic (valve) insufficiency: Secondary | ICD-10-CM

## 2024-01-04 DIAGNOSIS — I350 Nonrheumatic aortic (valve) stenosis: Secondary | ICD-10-CM

## 2024-01-04 LAB — EKG 12 LEAD
Atrial Rate: 65 {beats}/min
P-R Interval: 188 ms
Q-T Interval: 420 ms
QRS Duration: 66 ms
QTC Calculation: 436 ms
R Axis: -31 degrees
T Axis: 63 degrees
Ventricular Rate: 65 {beats}/min

## 2024-01-04 NOTE — Consults (Signed)
 Consults     Patient Name: Holly Blair, Holly Blair  Date of Service: January 03, 2024    Patient ID: [id number] Date of Birth: 11/15/1965    Clinician: GLENDIA KYM DOW, MD Facility: [facility]   Location: [facility]         CARDIAC SURGERY ATTENDING NOTE     This patient was seen in conduction with Dr. Wanda Lawyer as part of Structural Heart team evaluation.     Holly Blair is a 58 year old female with a past medical history significant for heart failure with preserved ejection fraction, mild AS, severe obesity, OSA, pulmonary hypertension, and hyperlipidemia who presents today for evaluation for aortic valve disease.  Holly Blair walks with a walker and had a previous fall in the bathtub on one occasion stating that it was quite slippery.  She does notice some shortness of breath when she is in a hurry, however, is able to do water aerobics and swimming at the [facility] and goes once every 2 weeks.  She has lost 40 pounds on Ozempic , however, still does remain super morbidly obese.     I have personally reviewed her most recent echocardiogram dated 09/22/2023.  This demonstrates a moderately dilated left ventricle with index volume 77 mL/sq m.  There is normal LV wall thickness.  There is no thrombus seen in the left ventricle.  Global LV systolic function is normal.  Calculated EF is 55%.  There are no regional wall motion abnormalities present.  Right ventricle is noted to be normal in size and function.  Aortic valve is noted to be trileaflet and mildly thickened.  There is mild valvular aortic stenosis with a V max 2.9 meters per second, mean gradient 18 mmHg, DVI 0.56, and AVA 1.6 sq cm.  There is mild to moderate aortic regurgitation present.  When compared to previous studies there does appear to be more dilation of the left ventricle and LVEF does appear to have declined from 75% to 55%.  There is also note made of increasingly severe mitral annular calcification without mitral stenosis.     Holly Blair had undergone a TAVR protocol  CTA 10/26/2023.  This demonstrates aortic annular diameter 23 x 20 mm with area 338 sq mm.  There is a trileaflet aortic valve that appears to open normally with mild calcification on the valve present.  The maximum size of the aortic root is 20 mm, maximum diameter of the sinuses of Valsalva are 31 mm, and the STJ is not effaced with a maximal diameter of 29 mm.  There is a minimal distance of the aortic annulus to left coronary ostium of 10 mm.     Today Holly Blair reports mild symptoms which may or may not be related to her aortic valve disease.  We discussed that with mild to moderate aortic insufficiency and mild aortic stenosis that this generally is insufficient to cause significant symptoms for patients.  It is certainly possible that her aortic valve disease underestimated on TTE, and therefore it would be reasonable to obtain a cardiac MRI to better quantitate her cardiac function as well as aortic valve disease.  We have discussed with Holly Blair today that at present based on her current echocardiogram she does not qualify for any transcatheter therapeutics for her aortic valve disease.  Should her MRI reveal worse valve disease than her echo suggests, we certainly are happy to reevaluate the idea of a transcatheter intervention for her aortic valve.  We also discussed that at present the only intervention  for aortic insufficiency is a valve in a clinical trial known as the JenaValve, the study being the ALIGN-AR study.  It is potentially possible that she would be a candidate for a commercially available device as well as her valve does have some degree of calcification present.  We discussed that should her MRI reveal only mild to moderate valve disease it may be reasonable to continue ongoing surveillance and observation.  Should this be the case there will be additional valve replacement options in the future as we anticipate that commercialization of the JenaValve by the end of this year.     Holly Blair asked many  excellent questions and ultimately was in agreement with the plan as stated above.  She will obtain a cardiac MRI to better quantitate her cardiac and valvular function.  Should this result as either the combination of moderate AS and moderate AI or severe AI then it would be reasonable to have a discussion regarding transcatheter aortic valve replacement.  Otherwise, I feel that it is reasonable to continue ongoing surveillance with followup in six months in order to once again assess Holly Blair's aortic valve.     Holly Blair voiced good understanding and was in agreement with the plan as stated above.                                                                             GLENDIA KYM DOW, MD        Date Dictated: 01/03/2024 11:51:33 AM    Date Transcribed: 01/04/2024    SCD/GJ   Job #: [id number]

## 2024-01-04 NOTE — Telephone Encounter (Signed)
 Holly Blair had her follow up labs ( CMP, lipid panel) after atorvastatin  increase.    Her CMP are normal and lipid panel is improved       Latest Reference Range & Units 11/03/23 14:46 01/03/24 10:04   Total Cholesterol <200 mg/dL 795 (H) 834   Triglyceride <150 mg/dL 888 75   LDL Cholesterol, NIH Equation <130 mg/dL 860 (H) 897   HDL Cholesterol >39 mg/dL 45 49   Non-HDL Cholesterol 0 - 159 mg/dL 840 883   Cholesterol/HDL Ratio  4.5 3.4   Lipid Panel, Additional Info.  (NOTE) (NOTE)         05/06/24:  TTE and RTC

## 2024-01-05 NOTE — Telephone Encounter (Signed)
 Pamboukian, Salpy Veronica, MD to [facility]        01/04/24  7:47 PM  Looks good. No changes

## 2024-01-09 NOTE — Progress Notes (Signed)
 [facility] STRUCTURAL HEART CLINIC - NEW PATIENT CONSULTATION    I have seen and evaluated the patient along with the fellow, Dr. Arland. I have reviewed and agree with his documented assessment and plan, which reflect my medical decision-making.     I spent 30 minutes on the patient's care on the date of the service including review of medical records, direct patient care, documentation and care coordination.      Wanda PARAS. Rea, MD  Interventional Cardiology

## 2024-01-16 ENCOUNTER — Telehealth (HOSPITAL_BASED_OUTPATIENT_CLINIC_OR_DEPARTMENT_OTHER): Payer: Self-pay | Admitting: "Endocrinology

## 2024-01-16 ENCOUNTER — Telehealth (HOSPITAL_BASED_OUTPATIENT_CLINIC_OR_DEPARTMENT_OTHER): Payer: Self-pay | Admitting: Naturopath

## 2024-01-16 DIAGNOSIS — I5032 Chronic diastolic (congestive) heart failure: Secondary | ICD-10-CM

## 2024-01-16 DIAGNOSIS — E1165 Type 2 diabetes mellitus with hyperglycemia: Secondary | ICD-10-CM

## 2024-01-16 MED ORDER — FREESTYLE LIBRE 3 PLUS SENSOR MISC
3 refills | Status: DC
Start: 2024-01-16 — End: 2024-02-24

## 2024-01-16 NOTE — Telephone Encounter (Signed)
 Prescription pended to provider to update pharmacy.

## 2024-01-16 NOTE — Telephone Encounter (Signed)
 Patient calling to request 2 things:    Prescription for Shower chair to what ever medical store/company will take her insurance.  Glucose monitor used under arm to test, because it is very difficult to prick her finger.

## 2024-01-16 NOTE — Telephone Encounter (Signed)
 Pt was informed by [facility] that the [facility] provider will need to authorize the new CGM to be able to get it covered.     Pt currently has a One Touch meter to test.     Pt will want the RX to be sent to the pharmacy listed below:    [facility] #[id number] 27[address] [place] [phone]  [address] [place] [address]  Phone: [phone]  Fax: [phone]  DEA #: --     *Informed pt of 72 hour processing*

## 2024-01-16 NOTE — Telephone Encounter (Signed)
 Call to [facility]-  She will discuss the continuous glucose monitor with her diabetes team - they already prescribed Free Style Libre in 07/2023.    She is also asking for a shower chair.    Note to FREADA Flattery to approve the prescription and I will send to [facility] company and contact     Last annual exam 04/2023  Note to ARNP Keehn to approve

## 2024-02-24 ENCOUNTER — Other Ambulatory Visit (HOSPITAL_BASED_OUTPATIENT_CLINIC_OR_DEPARTMENT_OTHER): Payer: Self-pay | Admitting: Naturopath

## 2024-02-24 ENCOUNTER — Other Ambulatory Visit (HOSPITAL_BASED_OUTPATIENT_CLINIC_OR_DEPARTMENT_OTHER): Payer: Self-pay | Admitting: "Endocrinology

## 2024-02-24 ENCOUNTER — Other Ambulatory Visit (HOSPITAL_BASED_OUTPATIENT_CLINIC_OR_DEPARTMENT_OTHER): Payer: Self-pay | Admitting: Nurse Practitioner

## 2024-02-24 DIAGNOSIS — E1165 Type 2 diabetes mellitus with hyperglycemia: Secondary | ICD-10-CM

## 2024-02-24 DIAGNOSIS — I5032 Chronic diastolic (congestive) heart failure: Secondary | ICD-10-CM

## 2024-02-24 DIAGNOSIS — F419 Anxiety disorder, unspecified: Secondary | ICD-10-CM

## 2024-02-24 DIAGNOSIS — Z6841 Body Mass Index (BMI) 40.0 and over, adult: Secondary | ICD-10-CM

## 2024-02-25 MED ORDER — FREESTYLE LIBRE 3 PLUS SENSOR MISC
3 refills | Status: DC
Start: 2024-02-25 — End: 2024-04-26

## 2024-02-27 ENCOUNTER — Telehealth (HOSPITAL_BASED_OUTPATIENT_CLINIC_OR_DEPARTMENT_OTHER): Payer: Self-pay

## 2024-02-27 NOTE — Telephone Encounter (Signed)
 See attached PA in media

## 2024-02-27 NOTE — Telephone Encounter (Signed)
 Last seen 04/2023  Has return wellness scheduled 04/25/2024

## 2024-02-27 NOTE — Telephone Encounter (Signed)
 Pt called to check on status of refill. Pt also requested to speak with nurse about shower chair prescription. Pt states nurse had indicated she would contact provider when provider returned to office but pt has not heard an update.    PSS unable to transfer. Routing to [employer].

## 2024-02-28 MED ORDER — FLUOXETINE HCL 20 MG OR CAPS
80.0000 mg | ORAL_CAPSULE | Freq: Every day | ORAL | 2 refills | Status: DC
Start: 2024-02-28 — End: 2024-04-25

## 2024-03-01 NOTE — Telephone Encounter (Signed)
 Prior Authorization Pending    Medication:   FreeStyle Libre 3 Plus Sensor   Quantity/Day supply:   2 / 30  Prescription Insurance:   [phone]  [facility] Workflow Exemption Request:   None  Pharmacy:   [facility] [phone] [address] [place] [phone]    Date Submitted:   03/01/2024   Type of Request:  [x]  Cover My Meds Key: [id number]    After The Prior Authorization Center has changed the status of the request to pending, please allow a minimum of 3 to 5 business days for the insurance to respond. (some insurances may take longer).

## 2024-03-02 ENCOUNTER — Ambulatory Visit
Admission: RE | Admit: 2024-03-02 | Discharge: 2024-03-02 | Disposition: A | Discharge status: Home / Self Care | Attending: Diagnostic Radiology | Admitting: Diagnostic Radiology

## 2024-03-02 ENCOUNTER — Telehealth (HOSPITAL_BASED_OUTPATIENT_CLINIC_OR_DEPARTMENT_OTHER): Payer: Self-pay | Admitting: Nurse Practitioner

## 2024-03-02 DIAGNOSIS — I351 Nonrheumatic aortic (valve) insufficiency: Secondary | ICD-10-CM | POA: Insufficient documentation

## 2024-03-02 MED ORDER — GADOTERIDOL 279.3 MG/ML IV SOLN
55.0000 mL | Freq: Once | INTRAVENOUS | Status: AC | PRN
Start: 2024-03-02 — End: 2024-03-02
  Administered 2024-03-02: 27.5 mmol via INTRAVENOUS

## 2024-03-02 NOTE — Telephone Encounter (Signed)
 Reason For Triage Call: Medication Refill (Urgent- needed within 3 days, can be routed to [facility] with high priority flag)  Have you contacted the office prior for this concern or issue? No    Does patient need to contact Tech Support? No    Refill Request     NAME OF MEDICATION(S):   FreeStyle Libre 3 Plus Sensor [[id number]]     DATE NEEDED BY: ASAP  PRESCRIBING PROVIDER: PRACHI    PHARMACY NAME/LOCATION:     [facility] #[id number] 27[address] [place] [phone]     ADDITIONAL INFORMATION: SHE STATES IT WAS NOT THERE WHEN SHE WENT TO PICK UP HER OTHER MEDS AND SHE IS GOING OUT OF TOWN ON 9/15  SHE DID NOT ASK FOR THEM, EITHER THOUGH. SHE DOES HAVE AN ALTERNATE OF CHECKING SUGARS AND WILL CALL THE PHARMACY TO SEE IF IN STOCK    Message has been routed to RN team (In-Basket Pool: [facility] Clinical RN Pool [phone]) Yes  (RN team will respond to urgent telephone messages within 24 business hours and non-urgent telephone messages within 72 business hours.)     Patient preferred method of contact: Phone Call    Patient warm transferred to RN? No warm transfer was needed.    Holly Blair

## 2024-03-02 NOTE — Telephone Encounter (Signed)
 LVM at pharmacy stating Rx sent for [facility] 3+ sensors on 02/24/24.  Asked them to supply [place] with more sensors.  If unable to, please call our office back at [phone].

## 2024-03-05 NOTE — Telephone Encounter (Signed)
 Per Dr. Vasudevan: Kindly inform pt and offer cash pay option. Thanks!     Let patient know in TE from 9/12

## 2024-03-05 NOTE — Telephone Encounter (Signed)
 Prior Authorization Denial    Medication:   FreeStyle Libre 3 Plus Sensor   Quantity/Day supply:   2 / 30  Prescription Insurance:   [phone]  Reason for Denial:   Continuous glucose monitor system (receiver, transmitter, and sensor) is denied for not meeting the  prior authorization requirement(s). Product authorization requires the following:  (1) Submission of medical records (for example: chart notes, laboratory values) or claims history  documenting one of the following:  (A) You are being treated with insulin.  (B) You have a history of problematic hypoglycemia with documentation of recurrent level 2  hypoglycemic events [glucose less than 54mg /dl (3.0 mmol/L)] that persist despite multiple attempts to  adjust medication(s) and/or modify the diabetes treatment plan.  (C) You have a history of problematic hypoglycemia with documentation of a history of a level 3  hypoglycemic event [glucose less than 54mg /dl (3.0 mmol/L)] characterized by altered mental and/or  physical state requiring third-party assistance for treatment of hypoglycemia.  (2) The treating provider has concluded that you or your caregiver has sufficient training using the  continuous glucose monitor (CGM).    Please note: This review applies to Dexcom G6, Dexcom G7, Freestyle [place], [address] [place] [address],  [address] [place] [address], [place] 14, Guardian 3 and 4.  Denial #:   [id number]  Options for clinics:  [x]  Patient may pay out of pocket for this item  [x]  Clinic may appeal.  (Within 65 days of the denial letter)   Full letter in media.    Please note: The Prior Authorization Center [facility]) does not take part in the appeal process. Appeals are completed by prescriber or clinic staff. Please refer to the denial letter, which has been uploaded into Careers information officer, for reference of how to start the appeal process.      Additional Comments:  Denial letter has been uploaded to Media.

## 2024-03-05 NOTE — Telephone Encounter (Signed)
 Per Dr. Excell inform pt and offer cash pay option. Thanks!

## 2024-03-05 NOTE — Telephone Encounter (Signed)
Denial letter uploaded to media.

## 2024-03-06 NOTE — Progress Notes (Signed)
 PHONE CALL NOTE    I called and left the patient a voice message to explain results of recent cardiac MRI that we had obtained to quantify severity of AR and LV volumes. The MRI showed that the LV volume and systolic function are normal and AR is only mild. I let her know that based on these results, she does not need to follow up with the Structural Heart clinic.     Wanda PARAS. Rea, MD  Interventional Cardiology

## 2024-04-05 ENCOUNTER — Other Ambulatory Visit (HOSPITAL_BASED_OUTPATIENT_CLINIC_OR_DEPARTMENT_OTHER): Payer: Self-pay | Admitting: "Endocrinology

## 2024-04-05 DIAGNOSIS — Z6841 Body Mass Index (BMI) 40.0 and over, adult: Secondary | ICD-10-CM

## 2024-04-05 DIAGNOSIS — I5032 Chronic diastolic (congestive) heart failure: Secondary | ICD-10-CM

## 2024-04-05 DIAGNOSIS — E1165 Type 2 diabetes mellitus with hyperglycemia: Secondary | ICD-10-CM

## 2024-04-07 MED ORDER — OZEMPIC (2 MG/DOSE) 8 MG/3ML SC SOPN: 2.0000 mg | PEN_INJECTOR | SUBCUTANEOUS | 8 refills | Status: DC

## 2024-04-07 NOTE — Telephone Encounter (Signed)
 Resent RX

## 2024-04-10 ENCOUNTER — Ambulatory Visit: Admitting: "Endocrinology

## 2024-04-16 ENCOUNTER — Other Ambulatory Visit: Payer: Self-pay

## 2024-04-16 ENCOUNTER — Other Ambulatory Visit (HOSPITAL_BASED_OUTPATIENT_CLINIC_OR_DEPARTMENT_OTHER): Payer: Self-pay | Admitting: Naturopath

## 2024-04-16 DIAGNOSIS — Z1231 Encounter for screening mammogram for malignant neoplasm of breast: Secondary | ICD-10-CM

## 2024-04-18 ENCOUNTER — Other Ambulatory Visit (HOSPITAL_BASED_OUTPATIENT_CLINIC_OR_DEPARTMENT_OTHER): Payer: Self-pay | Admitting: Naturopath

## 2024-04-18 DIAGNOSIS — E7800 Pure hypercholesterolemia, unspecified: Secondary | ICD-10-CM

## 2024-04-19 MED ORDER — ATORVASTATIN CALCIUM 80 MG OR TABS
80.0000 mg | ORAL_TABLET | Freq: Every day | ORAL | 1 refills | Status: AC
Start: 2024-04-19 — End: ?

## 2024-04-23 NOTE — Progress Notes (Unsigned)
 Advanced Heart Failure and Transplant Cardiology Return Patient Note    Patient: Holly Blair   Age:58 year old   MRN: [id number]   PCP: Keehn, Elan Cary, ARNP       Chief Complaint      HFpEF    History of Present Illness      Holly Blair is a 58 year old  year-old woman who returns for follow up of HFpEF, mild AS, OSA, obesity, PH, HTN and HLD.     Previously followed by Dr Betti who has departed [place]    05/20/22: NOS due to medical transportation issue    07/06/22: She notices that she has difficulty keeping up pace with other people or going up hills. On flat ground she does better and uses a walker to ambulate at baseline. She does water aerobics at the [facility]. She denies dizziness, presyncope or syncope.  She spoke at length about the passing of her fiance, as this is the 2 year anniversary. She is counseling to deal with the grief. 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    04/21/23: Cut back on food intake and walking more, with 20lb weight loss. Has been given Rx for Ozempic  and anticipates further weight loss. She is still doing water aerobic at the [facility] without CP or SOB. BP at home has been well controlled. She is nervous coming to this appointment so BP is a bit higher here.    11/03/23: TTE 09/22/23: LVEF 55% with LVIDd 5.3, dilated c/w previous study. LVEF decreased 75%>>55%. With enlarging ventricle and decrease in LVEF (although still in the normal range) in the setting of aortic valve disease, referred to Valve Clinic--has not yet scheduled. Has lost 40lbs on Ozempic  with A1C down to 6.5% (12% a year ago). Started on spironolactone  last visit, and overall feeling much better. Occasionally lightheaded if stands too quickly but can walk with walker without any dyspnea. She takes her time though. Plans to join [employer] to do water aerobics.     04/26/24: S/b Valve Clinic for mixed AS, AR with decline in LVEF 70>55%. MRI was performed that showed LVEF 60% and only mild AR and  AS, so no intervention required. Started on Entresto  and metoprolol  last visit. Feeling well. Denies CP, SOB. She paces herself because if she walks too fast she tires easily. Losing weight on Ozempic , maybe around 20lbs but she isn't sure. Her clothes are too big which is how she knows she has lost weight.     Heart Failure Checklist     Medical Therapy  Diuretic: Furosemide  20mg  dailly  Beta-Blocker: Metoprolol  12.5mg  daily  ACE-I/ARB/ARNI: Entresto  24-26mg  BID  MRA: Spironolactone  12.5mg  daily  SGLT-2 Inhibitor: Jardiance  10mg  daily  Hydralazine/Nitrate:   Digoxin:   Ozempic  2.5 mg weekly sq per patient  Amlodipine  10mg  daily  Device Therapy  ICD/CRT: No indication           Past Medical/Surgical History     HFpEF; TTE 2/21: LVEF 75% with impaired LV diastolic relaxation. TTE 09/22/20: LVEF 60-65%, abnormal diastolic fxn. TTE 09/22/23: LVEF 55% with LVIDd 5.3, dilated c/w previous study.  Mild AS and mild to moderate AR; S/b Valve Clinic for mixed AS, AR with decline in LVEF 70>55%. MRI was performed that showed LVEF 60% and only mild AR and AS, so no intervention required.   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  Systemic hypertension on amlodipine   Hyperlipidemia on  atorvastatin     Patient Active Problem List   Diagnosis    Depression, major, recurrent    Chronic knee pain    Chronic back pain    Senile nuclear cataract    Myopia with astigmatism and presbyopia    Morbid obesity with BMI of 50.0-59.9, adult (HCC)    Hyperlipidemia    Systolic murmur    Anxiety    Rosacea    Essential hypertension    Pulmonary HTN (HCC)    Prediabetes    Heart failure with preserved ejection fraction (HCC)    OSA (obstructive sleep apnea)    Mild aortic stenosis       Medications     Current Outpatient Medications   Medication Instructions    amLODIPine  (NORVASC ) 10 mg, Daily    atorvastatin  (LIPITOR) 80 mg, Oral, Daily, Dose changed on 11/15/23    blood glucose monitoring supply kit Does not apply, 2  times daily, Use to check blood sugar.    blood glucose test strip 1 strip, Does not apply, 2 times daily, Use to check blood sugar.    buPROPion  XL (WELLBUTRIN  XL) 150 mg, Oral, Daily, Take with 300 mg tablet for total daily dose of 450 mg.    buPROPion  XL (WELLBUTRIN  XL) 300 mg, Oral, Daily, Take with 150 mg tablet for total daily dose of 450 mg.    clotrimazole  1 % cream Topical, 2 times daily, For fungal rash.    Dexcom G7 Receiver For use with the Dexcom G7 Continuous Glucose Monitoring System.    Dexcom G7 Sensor Apply sensor to skin as instructed to continuously monitor blood sugar. Remove and replace every 10 days.    estradiol  0.01 % vaginal cream Use one gram at nighttime for 14 days, placing half of the dose into the vagina, and rubbing half around the vaginal opening and inner labia. Then repeat this twice weekly ongoing.    FLUoxetine  (PROZAC ) 80 mg, Oral, Daily, Dose changed on 11/16/22    furosemide  (LASIX ) 20 mg, Oral, Daily    Jardiance  10 mg, Oral, Every morning    metoprolol  succinate ER (TOPROL  XL) 12.5 mg, Oral, Daily    mirabegron  ER (MYRBETRIQ ) 50 mg, Oral, Daily    Ozempic  2 mg, Subcutaneous, Weekly    sacubitril-valsartan (Entresto ) 24-26 MG tablet 1 tablet, Oral, 2 times daily    spironolactone  (ALDACTONE ) 12.5 mg, Oral, Daily, Take with breakfast    triamcinolone  0.1 % cream Leg(s), 2 times daily         Allergies     Review of patient's allergies indicates:  Allergies   Allergen Reactions    Metformin  Dizziness and HP:[id number]          Physical Examination                                                       Vitals:  Vitals:    04/26/24 1530   BP: 123/70   Pulse: 65   Temp: 36 C   SpO2: 98%     Wt 290 lb 1.6 oz (131.588 kg)   Body mass index is 49.8 kg/m.       General:  Well-nourished adult in no distress    Eye:  Pupils are equal, round and reactive to light, Extraocular movements are intact, Normal conjunctiva.  HENT:  Normal.    Neck:  Supple   Respiratory:  Lungs are clear  to auscultation, No chest wall tenderness. Chest wall appearance normal.  Cardiovascular:  JVP not elevated, regular rate and rhythm, S1, S2, no S3, no S4, Murmurs: 2/6 SEM LSB radiating to neck    Gastrointestinal:  Soft, Non-tender, No organomegaly.   Lymphatics:  No swelling.  Integumentary:  Warm, Dry.  No rashes or skin changes  Neurologic:  Alert, Oriented, No focal defects.       Database     Labs      No results found for any visits on 04/26/24.        TTE (date: 11/20/18)  1. Normal LV size and systolic function  2. Evidence of diastolic compliance abnormality  3. Mild aortic stenosis  4. Mitral annular calciafication with 4 mm gradient  E/e' is increased and indicative of increased filling pressures.     TTE (date: 08/17/18)  Normal LV size and systolic function (EF 65%). No regional wall motion abnormalities.  Normal RV size and function.   Trace to mild aortic insufficiency. Mild-moderate aortic stenosis. Otherwise normal valvular function.   Mild pulmonary hypertension. Estimated PASP 39-72mmHg based on an estimated right atrial pressure of 5-49mmHg.  No pericardial effusion.      TTE (date: 07/24/2019):  1. 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 There is associated mild regurgitation. Guidelines suggest repeat echo in 3-5 years.   2. Normal left ventricular chamber size with hyperdynamic systolic function (EF 75%). No regional wall motion abnormalities seen. Impaired LV diastolic relaxation with likely normal filling pressures.  3. here is mild mitral annular calcification. Otherwise normal valvular anatomy and function.   4. Right ventricle not well visualized but probably normal size and function.   5. Mild pulmonary hypertension (PASP 36-41 mmHg) with mild central venous pressure (5-10 mmHg).  6. No pericardial effusion.  Compared to prior study, no major changes.     TTE 09/22/20: LVEF 60-65%, abnormal diastolic fxn.    TTE 09/22/23:  LVEF 55% with LVIDd 5.3, dilated c/w previous study.       Other Imaging:     Cardiac MRI 9/25:  IMPRESSION  Normal biventricular size and systolic function, LVEF = 60% and RVEF = 57%.  Mild aortic regurgitation (regurgitant fraction 8%) with mild aortic stenosis.  Mild mitral regurgitation. Trace tricuspid regurgitation.    CT Chest PE Protocol (date: 11/19/18)  No evidence of pulmonary embolism.  -Mildly enlarged bilateral axillary lymph nodes, indeterminate in etiology. Consider further investigation if clinically indicated.  -Dilated main pulmonary artery and reversal of main pulmonary artery to aorta ratio indicates presence of pulmonary hypertension.  -Combination of groundglass opacity, minimal septal thickening, and bilateral pleural effusions represents presence of pulmonary edema.       Impression and Plan       Impression  HFpEF; TTE 2/21: LVEF 75% with impaired LV diastolic relaxation. TTE 09/22/20: LVEF 60-65%, abnormal diastolic fxn. TTE 09/22/23: LVEF 55% with LVIDd 5.3, dilated c/w previous study.  NYHA Class I-II  Euvolemic on exam  On optimal GDMT  Mild AS and mild to moderate AR; S/b Valve Clinic for mixed AS, AR with decline in LVEF 70>55%. MRI was performed that showed LVEF 60% and only mild AR and AS, so no intervention required.   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 Meanwhile follow up with Diabetes Clinic in Feb as scheduled.       CC:  PCP: Keehn, Elan Cary, ARNP       I personally interviewed and examined this patient during this visit and confirmed all of the information noted.  The impression and plan above was dictated or typed by me and was directed by my personal evaluation of this patient.     I spent a total of 30 minutes for the patient's care on the date of the service.   This includes the time reviewing the chart, time with the patient in the exam room,  and time completing the note on the date of service.       Signature Line    [place] V. Tsutomu Barfoot MD [facility]  Professor of Medicine  Advanced Heart Failure and Transplant Cardiology

## 2024-04-23 NOTE — Patient Instructions (Signed)
 Medication changes during this visit:     Return to our Advanced Heart Failure Clinic in     Testing to complete before your next visit:     Please stop by the Outpatient Blood Draw lab before every cardiology clinic appointment for blood work.    Please bring your medication bottles and medication list with you to every visit.    Take your medications as prescribed and let us know if you feel that the medications are causing any side effect. If you miss a dose and it's almost time for your next dose, just wait and take your next dose at the normal time. Don't take a double dose.    Please avoid salt intake and follow healthy life style. Limit canned, dried, packaged, and fast foods because that type of food is rich in salt.Please check your blood pressure, heart rate and  weight daily  at home and keep records of it. Bring your log with you to cardiology clinic appointment.    Weigh yourself when you wake up, after going to bathroom. Do not eat, drink, or get dressed prior to weighing yourself. Call our clinic if your weight gain is 2 pounds in one day or 5 pounds in a week        For any other questions or concerns, please call my nurse, Patterson Hammersmith, at [phone].    For appointments or rescheduling, please call [phone] option [phone].    The fax number for the clinic [phone].

## 2024-04-25 ENCOUNTER — Ambulatory Visit: Attending: Naturopath | Admitting: Naturopath

## 2024-04-25 VITALS — BP 119/70 | HR 70 | Wt 292.1 lb

## 2024-04-25 DIAGNOSIS — Z7409 Other reduced mobility: Secondary | ICD-10-CM | POA: Insufficient documentation

## 2024-04-25 DIAGNOSIS — F331 Major depressive disorder, recurrent, moderate: Secondary | ICD-10-CM | POA: Insufficient documentation

## 2024-04-25 DIAGNOSIS — N952 Postmenopausal atrophic vaginitis: Secondary | ICD-10-CM | POA: Insufficient documentation

## 2024-04-25 DIAGNOSIS — Z0000 Encounter for general adult medical examination without abnormal findings: Secondary | ICD-10-CM | POA: Insufficient documentation

## 2024-04-25 DIAGNOSIS — Z1211 Encounter for screening for malignant neoplasm of colon: Secondary | ICD-10-CM | POA: Insufficient documentation

## 2024-04-25 DIAGNOSIS — F419 Anxiety disorder, unspecified: Secondary | ICD-10-CM | POA: Insufficient documentation

## 2024-04-25 DIAGNOSIS — E118 Type 2 diabetes mellitus with unspecified complications: Secondary | ICD-10-CM | POA: Insufficient documentation

## 2024-04-25 MED ORDER — FLUOXETINE HCL 20 MG OR CAPS
80.0000 mg | ORAL_CAPSULE | Freq: Every day | ORAL | 2 refills | Status: AC
Start: 2024-04-25 — End: ?

## 2024-04-25 MED ORDER — DEXCOM G7 RECEIVER DEVI
0 refills | Status: AC
Start: 2024-04-25 — End: ?

## 2024-04-25 MED ORDER — DEXCOM G7 SENSOR MISC
6 refills | Status: AC
Start: 2024-04-25 — End: ?

## 2024-04-25 MED ORDER — ESTRADIOL 0.01 % VA CREA
TOPICAL_CREAM | VAGINAL | 1 refills | Status: AC
Start: 2024-04-25 — End: ?

## 2024-04-25 MED ORDER — BUPROPION HCL ER (XL) 300 MG OR TB24
300.0000 mg | EXTENDED_RELEASE_TABLET | Freq: Every day | ORAL | 1 refills | Status: AC
Start: 2024-04-25 — End: ?

## 2024-04-25 MED ORDER — BUPROPION HCL ER (XL) 150 MG OR TB24
150.0000 mg | EXTENDED_RELEASE_TABLET | Freq: Every day | ORAL | 1 refills | Status: AC
Start: 2024-04-25 — End: ?

## 2024-04-25 NOTE — Progress Notes (Deleted)
 Preventive Health Visit    Holly Blair is a 58 year old who presents on 04/25/2024 for a wellness visit.    Subjective  {VT  Meds  HM  Care Gaps :999}    Additional concerns addressed include:      Patient reporting occasional vaginal odor, with abnormal urine smell, no vaginal discharge. Does not douche or use scented personal care products. No concerns for any STIs, 1 female sexual partner who lives in [place], [place].  Has also noticed dry vaginal irritation and occasional pain with intercourse. Symptoms are not present today, symptoms come and go without any known causes. Currently taking probiotics, which seem to help. Discussed estradiol  cream.      She reports mental health has improved and is feeling well. Continues to take fluoxetine  and bupropion , requesting refills.  Currently living in an apartment and has more stable housing. Is engaged.  PHQ-9 and GAD-7 today.     Needs new handicap placard. She uses a walker and has mobility concerns.     Followed by DM specialist and cardiology for chronic conditions. Request CGM for glucose monitoring. Feeling well with all of the management, currently taking GLP1 through diabetic provider.     {VT  Problem List  Medical Hx  Surgical Hx  Family Hx  Substance & Sexual Hx  Social Documentation  SDOH - Lifestyle, Violence, Safety :999}  Current Medications[1]    Patient Active Problem List   Diagnosis    Depression, major, recurrent    Chronic knee pain    Chronic back pain    Senile nuclear cataract    Myopia with astigmatism and presbyopia    Morbid obesity with BMI of 50.0-59.9, adult (HCC)    Hyperlipidemia    Systolic murmur    Anxiety    Rosacea    Essential hypertension    Pulmonary HTN (HCC)    Prediabetes    Heart failure with preserved ejection fraction (HCC)    OSA (obstructive sleep apnea)    Mild aortic stenosis       PREVENTATIVE HEALTH SCREENING   -Mammogram: scheduled Jan. 2026  -Diabetes Screen: followed by DM specialist (scheduled in  December)  -Cholesterol Screen: updated in July 2025 (managed by cardiology)  -Pap smear: completed in 2024, due in 2029  -Colonoscopy:deferred. Recommend FIT test  -Dexa: due at age 30  -Immunizations: has received both COVID and flu vaccinations this year    GYN HISTORY  OB History   Gravida Para Term Preterm AB Living   4 4 4  0 0 0   SAB IAB Ectopic Multiple Live Births   0 0 0 0 0     LMP: 03/31/2017   Menses: post menopausal  Last pap: 2024, due in 2029  Pap history: no history of abnormal paps  Other gyn history: none    SEXUAL HISTORY  Sexual activity: yes, single partner, contraception - post menopausal  History of STD: none known  Sexual concerns: No    LIFESTYLE  Diet: heart healthy & well balanced  Calcium : CALCIUM  INTAKE: dietary sources only  Exercise: recommended trying to walk and get daily activity     HABITS  Smoker: does not use tobacco  Alcohol: social drinker  Recreational Drug Use: denies current use    SAFETY  Are you afraid of falling: No  Safe in current living space: YES  Regular seat belt use: YES  Stress: Yes: managed with medications and is feeling ok managing mental health today  I have reviewed and updated the problem list, medical history, family history, social history, medications, allergies in the electronic medical record.    Objective: {VT  Vitals  Vitals Flowsheet  Labs  Imaging :999}   BP 119/70   Pulse 70   Wt (!) 132.5 kg (292 lb 1.6 oz)   LMP 03/31/2017 (Within Days)   SpO2 95%   BMI 50.14 kg/m   Body mass index is 50.14 kg/m.  Physical Exam  Mood/affect: Pleasant female. Mood and affect appropriate.  Skin: Skin color, texture, turgor normal. No rashes or concerning lesions  Respiratory: clear to auscultation  Heart: normal rate, regular rhythm and no murmurs, clicks, or gallops      Assessment and Plan: {VT  Meds  HM  Care Gaps :999}     Holly Blair was seen today for wellness.    Diagnoses and all orders for this visit:    Annual wellness visit    Moderate episode  of recurrent major depressive disorder (HCC)  -     buPROPion  XL 150 MG 24 hr tablet; Take 1 tablet (150 mg) by mouth daily. Take with 300 mg tablet for total daily dose of 450 mg.  -     buPROPion  XL 300 MG 24 hr tablet; Take 1 tablet (300 mg) by mouth daily. Take with 150 mg tablet for total daily dose of 450 mg.    Anxiety  -     FLUoxetine  20 MG capsule; Take 4 capsules (80 mg) by mouth daily. Dose changed on 11/16/22    Diabetes mellitus type 2 with complications (HCC)  -     Dexcom G7 Receiver; For use with the Dexcom G7 Continuous Glucose Monitoring System.  -     Dexcom G7 Sensor; Apply sensor to skin as instructed to continuously monitor blood sugar. Remove and replace every 10 days.    Atrophic vaginitis  -     estradiol  0.01 % vaginal cream; Use one gram at nighttime for 14 days, placing half of the dose into the vagina, and rubbing half around the vaginal opening and inner labia. Then repeat this twice weekly ongoing.    Colon cancer screening  -     Fecal Immunochemical Test (Colorectal Cancer Screen); Future    Limited mobility  -     DISABLED PARKING PERMIT AUTHORIZATION        Health Care Maintenance:  Screening Labs: managed through specialty providers, labs are up to date  Contraception: post menopausal  Pap smear: due in 2029  STD Check: not needed today  Mammogram: scheduled for Jan 2026  Colonoscopy: FIT test provided  Dexa Scan: due at age 45    Additional Recommendations:   Preventive counseling:   health maintenance  Immunizations: updated, recommended yearly vaccinations  moderation in EtOH use/no drinking and driving     Follow-up: prn    Estiben Mizuno DNP student         [1]   Current Outpatient Medications   Medication Sig Dispense Refill    amLODIPine  10 MG tablet Take 1 tablet (10 mg) by mouth daily.      atorvastatin  80 MG tablet Take 1 tablet (80 mg) by mouth daily. Dose changed on 11/15/23 90 tablet 1    blood glucose monitoring supply kit Use 2 times a day. Use to check blood sugar.  1 kit 1    blood glucose test strip Use 1 strip 2 times a day. Use to check blood sugar. 100 strip 1  buPROPion  XL 150 MG 24 hr tablet Take 1 tablet (150 mg) by mouth daily. Take with 300 mg tablet for total daily dose of 450 mg. 90 tablet 1    buPROPion  XL 300 MG 24 hr tablet Take 1 tablet (300 mg) by mouth daily. Take with 150 mg tablet for total daily dose of 450 mg. 90 tablet 1    clotrimazole  1 % cream Apply topically 2 times a day. For fungal rash. 45 g 2    Dexcom G7 Receiver For use with the Dexcom G7 Continuous Glucose Monitoring System. 1 each 0    Dexcom G7 Sensor Apply sensor to skin as instructed to continuously monitor blood sugar. Remove and replace every 10 days. 1 each 6    empagliflozin  (Jardiance ) 10 MG tablet Take 1 tablet (10 mg) by mouth every morning. 90 tablet 1    estradiol  0.01 % vaginal cream Use one gram at nighttime for 14 days, placing half of the dose into the vagina, and rubbing half around the vaginal opening and inner labia. Then repeat this twice weekly ongoing. 42.5 g 1    FLUoxetine  20 MG capsule Take 4 capsules (80 mg) by mouth daily. Dose changed on 11/16/22 360 capsule 2    FreeStyle Libre 3 Plus Sensor Apply sensor to the back of upper arm as instructed to continuously monitor blood sugar. Remove and replace every 15 days. 6 each 3    FreeStyle Libre 3 Reader Use with FreeStyle Libre 3 Sensor for continuous glucose monitoring. 1 each 0    furosemide  20 MG tablet Take 1 tablet (20 mg) by mouth daily. 90 tablet 1    glucometer (OneTouch Verio Flex System) w/Device kit Use 2 times a day. Use to check blood sugar.      lancets thin Use 1 each 2 times a day. 200 each 3    metoprolol  succinate ER 25 MG 24 hr tablet Take 0.5 tablets (12.5 mg) by mouth daily. 90 tablet 3    mirabegron  ER (Myrbetriq ) 50 MG 24 hr tablet Take 1 tablet (50 mg) by mouth daily. 90 tablet 1    sacubitril-valsartan (Entresto ) 24-26 MG tablet Take 1 tablet by mouth 2 times a day. 180 tablet 3    semaglutide   (Ozempic ) 2 mg dose (8 mg/3 mL) pen-injector Inject 2 mg under the skin one time a week. 3 mL 8    sharps container Use as instructed. 1 each 1    spironolactone  25 MG tablet Take 0.5 tablets (12.5 mg) by mouth daily. Take with breakfast 45 tablet 3    triamcinolone  0.1 % cream Apply to affected area on leg(s) 2 times a day. 1 Tube 3     No current facility-administered medications for this visit.

## 2024-04-26 ENCOUNTER — Ambulatory Visit
Admission: RE | Admit: 2024-04-26 | Discharge: 2024-04-26 | Disposition: A | Discharge status: Home / Self Care | Attending: Cardiovascular Disease

## 2024-04-26 ENCOUNTER — Ambulatory Visit (HOSPITAL_BASED_OUTPATIENT_CLINIC_OR_DEPARTMENT_OTHER): Admitting: Cardiovascular Disease

## 2024-04-26 VITALS — BP 123/70 | HR 65 | Temp 96.8°F | Ht 64.0 in | Wt 290.1 lb

## 2024-04-26 DIAGNOSIS — I10 Essential (primary) hypertension: Secondary | ICD-10-CM | POA: Insufficient documentation

## 2024-04-26 DIAGNOSIS — I340 Nonrheumatic mitral (valve) insufficiency: Secondary | ICD-10-CM

## 2024-04-26 DIAGNOSIS — I5032 Chronic diastolic (congestive) heart failure: Secondary | ICD-10-CM | POA: Insufficient documentation

## 2024-04-26 DIAGNOSIS — I110 Hypertensive heart disease with heart failure: Secondary | ICD-10-CM

## 2024-04-26 DIAGNOSIS — I350 Nonrheumatic aortic (valve) stenosis: Secondary | ICD-10-CM | POA: Insufficient documentation

## 2024-04-26 LAB — TRANSTHORACIC ECHO (TTE) COMPLETE
AoV max: 311.6 cm/s
AoV mean PG: 21.3 mmHg
Ascending aorta: 3.3 cm
Ascending aorta: 3.3 cm
IVSd: 1.1 cm
IVSd: 1.1 cm
LVIDd: 4.9 cm
LVIDd: 4.9 cm
LVIDs: 3 cm
LVIDs: 3 cm
LVPWd: 1.2 cm
LVPWd: 1.2 cm
RV Free wall pk S': 36.1 mmHg
RVDd: 4.2 cm
RVDd: 4.2 cm
TR Peak Vel: 279.1 cm/s

## 2024-04-26 LAB — CBC, DIFF
% Basophils: 0 %
% Eosinophils: 1 %
% Immature Granulocytes: 0 %
% Lymphocytes: 35 %
% Monocytes: 4 %
% Neutrophils: 60 %
% Nucleated RBC: 0 %
Absolute Eosinophil Count: 0.14 10*3/uL (ref 0.00–0.50)
Absolute Lymphocyte Count: 3.78 10*3/uL (ref 1.00–4.80)
Basophils: 0.03 10*3/uL (ref 0.00–0.20)
Hematocrit: 41 % (ref 36.0–45.0)
Hemoglobin: 13.1 g/dL (ref 11.5–15.5)
Immature Granulocytes: 0.03 10*3/uL (ref 0.00–0.05)
MCH: 29.5 pg (ref 27.3–33.6)
MCHC: 32.2 g/dL (ref 32.2–36.5)
MCV: 92 fL (ref 81–98)
Monocytes: 0.48 10*3/uL (ref 0.00–0.80)
Neutrophils: 6.43 10*3/uL (ref 1.80–7.00)
Nucleated RBC: 0 10*3/uL
Platelet Count: 261 10*3/uL (ref 150–400)
RBC: 4.44 10*6/uL (ref 3.80–5.00)
RDW-CV: 13.8 % (ref 11.0–14.5)
WBC: 10.89 10*3/uL — ABNORMAL HIGH (ref 4.3–10.0)

## 2024-04-26 LAB — COMPREHENSIVE METABOLIC PANEL
ALT (GPT): 30 U/L (ref 7–33)
AST (GOT): 22 U/L (ref 9–38)
Albumin: 4.1 g/dL (ref 3.5–5.2)
Alkaline Phosphatase (Total): 108 U/L (ref 31–132)
Anion Gap: 9 (ref 4–12)
Bilirubin (Total): 0.9 mg/dL (ref 0.2–1.3)
Calcium: 9.2 mg/dL (ref 8.9–10.2)
Carbon Dioxide, Total: 29 meq/L (ref 22–32)
Chloride: 103 meq/L (ref 98–108)
Creatinine: 0.79 mg/dL (ref 0.38–1.02)
Glucose: 98 mg/dL (ref 62–125)
Potassium: 3.9 meq/L (ref 3.6–5.2)
Protein (Total): 7.7 g/dL (ref 6.0–8.2)
Sodium: 141 meq/L (ref 135–145)
Urea Nitrogen: 14 mg/dL (ref 8–21)
eGFR by CKD-EPI 2021: >60 mL/min/1.73_m2 (ref >59)

## 2024-04-26 LAB — MAGNESIUM: Magnesium: 2 mg/dL (ref 1.8–2.4)

## 2024-04-30 ENCOUNTER — Ambulatory Visit (HOSPITAL_BASED_OUTPATIENT_CLINIC_OR_DEPARTMENT_OTHER): Payer: Self-pay | Admitting: Cardiovascular Disease

## 2024-04-30 LAB — NT-PROBNP: NT-proBNP: 176 pg/mL (ref 0–226)

## 2024-05-01 NOTE — Progress Notes (Signed)
 Preventive Health Visit    Holly Blair is a 58 year old who presents on 04/25/2024 for a wellness visit.    Subjective      Additional concerns addressed include:      Patient reporting occasional vaginal odor, with abnormal urine smell, no vaginal discharge. Does not douche or use scented personal care products. No concerns for any STIs, 1 female sexual partner who lives in [place], [place].  Has also noticed dry vaginal irritation and occasional pain with intercourse. Symptoms are not present today, symptoms come and go without any known causes. Currently taking probiotics, which seem to help. Discussed estradiol  cream.      She reports mental health has improved and is feeling well. Continues to take fluoxetine  and bupropion , requesting refills.  Currently living in an apartment and has more stable housing. Is engaged.  PHQ-9 and GAD-7 today.     Needs new handicap placard. She uses a walker and has mobility concerns.     Followed by DM specialist and cardiology for chronic conditions. Request CGM for glucose monitoring. Feeling well with all of the management, currently taking GLP1 through diabetic provider.       Current Medications[1]    Patient Active Problem List   Diagnosis    Depression, major, recurrent    Chronic knee pain    Chronic back pain    Senile nuclear cataract    Myopia with astigmatism and presbyopia    Morbid obesity with BMI of 50.0-59.9, adult (HCC)    Hyperlipidemia    Systolic murmur    Anxiety    Rosacea    Essential hypertension    Pulmonary HTN (HCC)    Prediabetes    Heart failure with preserved ejection fraction (HCC)    OSA (obstructive sleep apnea)    Mild aortic stenosis       PREVENTATIVE HEALTH SCREENING   -Mammogram: scheduled Jan. 2026  -Diabetes Screen: followed by DM specialist (scheduled in December)  -Cholesterol Screen: updated in July 2025 (managed by cardiology)  -Pap smear: completed in 2024, due in 2029  -Colonoscopy:deferred. Recommend FIT test  -Dexa: due at age  89  -Immunizations: has received both COVID and flu vaccinations this year    GYN HISTORY  OB History   Gravida Para Term Preterm AB Living   4 4 4  0 0 0   SAB IAB Ectopic Multiple Live Births   0 0 0 0 0     LMP: 03/31/2017   Menses: post menopausal  Last pap: 2024, due in 2029  Pap history: no history of abnormal paps  Other gyn history: none    SEXUAL HISTORY  Sexual activity: yes, single partner, contraception - post menopausal  History of STD: none known  Sexual concerns: No    LIFESTYLE  Diet: heart healthy & well balanced  Calcium : CALCIUM  INTAKE: dietary sources only  Exercise: recommended trying to walk and get daily activity     HABITS  Smoker: does not use tobacco  Alcohol: social drinker  Recreational Drug Use: denies current use    SAFETY  Are you afraid of falling: No  Safe in current living space: YES  Regular seat belt use: YES  Stress: Yes: managed with medications and is feeling ok managing mental health today    I have reviewed and updated the problem list, medical history, family history, social history, medications, allergies in the electronic medical record.    Objective:    BP 119/70   Pulse 70  Wt (!) 132.5 kg (292 lb 1.6 oz)   LMP 03/31/2017 (Within Days)   SpO2 95%   BMI 50.14 kg/m   Body mass index is 50.14 kg/m.  Physical Exam  Mood/affect: Pleasant female. Mood and affect appropriate.  Skin: Skin color, texture, turgor normal. No rashes or concerning lesions  Respiratory: clear to auscultation  Heart: normal rate, regular rhythm and no murmurs, clicks, or gallops      Assessment and Plan:      Season was seen today for wellness.    Diagnoses and all orders for this visit:    Annual wellness visit  Health Care Maintenance:  Screening Labs: managed through specialty providers, labs are up to date  Contraception: post menopausal  Pap smear: due in 2029  STD Check: not needed today  Mammogram: scheduled for Jan 2026  Colonoscopy: FIT test provided  Dexa Scan: due at age  12    Additional Recommendations:   Preventive counseling:   health maintenance  Immunizations: updated, recommended yearly vaccinations  moderation in EtOH use/no drinking and driving     Moderate episode of recurrent major depressive disorder (HCC)  -     buPROPion  XL 150 MG 24 hr tablet; Take 1 tablet (150 mg) by mouth daily. Take with 300 mg tablet for total daily dose of 450 mg.  -     buPROPion  XL 300 MG 24 hr tablet; Take 1 tablet (300 mg) by mouth daily. Take with 150 mg tablet for total daily dose of 450 mg.    Anxiety  -     FLUoxetine  20 MG capsule; Take 4 capsules (80 mg) by mouth daily. Dose changed on 11/16/22    Diabetes mellitus type 2 with complications (HCC)  -     Dexcom G7 Receiver; For use with the Dexcom G7 Continuous Glucose Monitoring System. (Patient not taking: Reported on 04/26/2024)  -     Dexcom G7 Sensor; Apply sensor to skin as instructed to continuously monitor blood sugar. Remove and replace every 10 days. (Patient not taking: Reported on 04/26/2024)    Atrophic vaginitis  -     estradiol  0.01 % vaginal cream; Use one gram at nighttime for 14 days, placing half of the dose into the vagina, and rubbing half around the vaginal opening and inner labia. Then repeat this twice weekly ongoing.    Colon cancer screening  -     Fecal Immunochemical Test (Colorectal Cancer Screen); Future    Limited mobility  -     DISABLED PARKING PERMIT AUTHORIZATION    Follow-up: prn    Eleanor Dubs DNP student    Patient seen, interviewed and examine by [facility] FNP DNP student. I repeated physical exam portion of the encounter. I have verified all of the student's practitioner's documentation for this encounter.  I agree with the assessment and plan as outlined.  Nara Paternoster, ARNP, ND         [1]   Current Outpatient Medications   Medication Sig Dispense Refill    amLODIPine  10 MG tablet Take 1 tablet (10 mg) by mouth daily.      atorvastatin  80 MG tablet Take 1 tablet (80 mg) by mouth daily. Dose changed on  11/15/23 90 tablet 1    blood glucose monitoring supply kit Use 2 times a day. Use to check blood sugar. 1 kit 1    blood glucose test strip Use 1 strip 2 times a day. Use to check blood sugar. 100 strip 1  buPROPion  XL 150 MG 24 hr tablet Take 1 tablet (150 mg) by mouth daily. Take with 300 mg tablet for total daily dose of 450 mg. 90 tablet 1    buPROPion  XL 300 MG 24 hr tablet Take 1 tablet (300 mg) by mouth daily. Take with 150 mg tablet for total daily dose of 450 mg. 90 tablet 1    clotrimazole  1 % cream Apply topically 2 times a day. For fungal rash. 45 g 2    Dexcom G7 Receiver For use with the Dexcom G7 Continuous Glucose Monitoring System. (Patient not taking: Reported on 04/26/2024) 1 each 0    Dexcom G7 Sensor Apply sensor to skin as instructed to continuously monitor blood sugar. Remove and replace every 10 days. (Patient not taking: Reported on 04/26/2024) 1 each 6    empagliflozin  (Jardiance ) 10 MG tablet Take 1 tablet (10 mg) by mouth every morning. 90 tablet 1    estradiol  0.01 % vaginal cream Use one gram at nighttime for 14 days, placing half of the dose into the vagina, and rubbing half around the vaginal opening and inner labia. Then repeat this twice weekly ongoing. 42.5 g 1    FLUoxetine  20 MG capsule Take 4 capsules (80 mg) by mouth daily. Dose changed on 11/16/22 360 capsule 2    furosemide  20 MG tablet Take 1 tablet (20 mg) by mouth daily. 90 tablet 1    metoprolol  succinate ER 25 MG 24 hr tablet Take 0.5 tablets (12.5 mg) by mouth daily. 90 tablet 3    mirabegron  ER (Myrbetriq ) 50 MG 24 hr tablet Take 1 tablet (50 mg) by mouth daily. 90 tablet 1    sacubitril-valsartan (Entresto ) 24-26 MG tablet Take 1 tablet by mouth 2 times a day. 180 tablet 3    semaglutide  (Ozempic ) 2 mg dose (8 mg/3 mL) pen-injector Inject 2 mg under the skin one time a week. 3 mL 8    spironolactone  25 MG tablet Take 0.5 tablets (12.5 mg) by mouth daily. Take with breakfast 45 tablet 3    triamcinolone  0.1 % cream  Apply to affected area on leg(s) 2 times a day. (Patient not taking: Reported on 04/26/2024) 1 Tube 3     No current facility-administered medications for this visit.

## 2024-05-04 ENCOUNTER — Other Ambulatory Visit (HOSPITAL_BASED_OUTPATIENT_CLINIC_OR_DEPARTMENT_OTHER): Payer: Self-pay | Admitting: Naturopath

## 2024-05-04 DIAGNOSIS — E118 Type 2 diabetes mellitus with unspecified complications: Secondary | ICD-10-CM

## 2024-05-07 MED ORDER — JARDIANCE 10 MG OR TABS: 10.0000 mg | ORAL_TABLET | Freq: Every morning | ORAL | 3 refills | Status: DC

## 2024-05-09 ENCOUNTER — Other Ambulatory Visit (HOSPITAL_BASED_OUTPATIENT_CLINIC_OR_DEPARTMENT_OTHER): Payer: Self-pay | Admitting: Naturopath

## 2024-05-09 ENCOUNTER — Ambulatory Visit: Attending: Naturopath

## 2024-05-09 DIAGNOSIS — Z1211 Encounter for screening for malignant neoplasm of colon: Secondary | ICD-10-CM | POA: Insufficient documentation

## 2024-05-10 LAB — OCCULT BLOOD BY IA, STL: Occult Bld 1 Result: NEGATIVE

## 2024-05-14 ENCOUNTER — Ambulatory Visit (HOSPITAL_BASED_OUTPATIENT_CLINIC_OR_DEPARTMENT_OTHER): Payer: Self-pay | Admitting: Naturopath

## 2024-06-12 ENCOUNTER — Other Ambulatory Visit (HOSPITAL_BASED_OUTPATIENT_CLINIC_OR_DEPARTMENT_OTHER): Payer: Self-pay | Admitting: "Endocrinology

## 2024-06-12 ENCOUNTER — Other Ambulatory Visit (HOSPITAL_BASED_OUTPATIENT_CLINIC_OR_DEPARTMENT_OTHER): Payer: Self-pay | Admitting: Naturopath

## 2024-06-12 DIAGNOSIS — E118 Type 2 diabetes mellitus with unspecified complications: Secondary | ICD-10-CM

## 2024-06-12 DIAGNOSIS — I5032 Chronic diastolic (congestive) heart failure: Secondary | ICD-10-CM

## 2024-06-12 DIAGNOSIS — Z6841 Body Mass Index (BMI) 40.0 and over, adult: Secondary | ICD-10-CM

## 2024-06-12 DIAGNOSIS — E1165 Type 2 diabetes mellitus with hyperglycemia: Secondary | ICD-10-CM

## 2024-06-15 MED ORDER — EMPAGLIFLOZIN 10 MG OR TABS: 10.0000 mg | ORAL_TABLET | Freq: Every morning | ORAL | 3 refills | Status: AC

## 2024-06-15 MED ORDER — OZEMPIC (2 MG/DOSE) 8 MG/3ML SC SOPN: 2.0000 mg | PEN_INJECTOR | SUBCUTANEOUS | 6 refills | Status: AC

## 2024-06-28 ENCOUNTER — Ambulatory Visit
Admission: RE | Admit: 2024-06-28 | Discharge: 2024-06-28 | Disposition: A | Discharge status: Home / Self Care | Attending: Diagnostic Radiology | Admitting: Diagnostic Radiology

## 2024-06-28 ENCOUNTER — Encounter (HOSPITAL_BASED_OUTPATIENT_CLINIC_OR_DEPARTMENT_OTHER): Payer: Self-pay

## 2024-06-28 DIAGNOSIS — Z1231 Encounter for screening mammogram for malignant neoplasm of breast: Secondary | ICD-10-CM | POA: Insufficient documentation

## 2024-06-28 HISTORY — DX: Essential (primary) hypertension: I10

## 2024-06-28 HISTORY — DX: Atherosclerotic heart disease of native coronary artery without angina pectoris: I25.10

## 2024-06-28 HISTORY — DX: Type 2 diabetes mellitus without complications: E11.9

## 2024-06-28 HISTORY — DX: Primary insomnia: F51.01

## 2024-06-28 HISTORY — DX: Constipation, unspecified: K59.00

## 2024-06-28 HISTORY — DX: Hyperlipidemia, unspecified: E78.5

## 2024-07-12 ENCOUNTER — Telehealth (HOSPITAL_BASED_OUTPATIENT_CLINIC_OR_DEPARTMENT_OTHER): Payer: Self-pay | Admitting: Naturopath

## 2024-07-12 DIAGNOSIS — Z7409 Other reduced mobility: Secondary | ICD-10-CM

## 2024-07-12 NOTE — Telephone Encounter (Signed)
 RETURN CALL: Voicemail - Detailed Message      SUBJECT:  General Message     MESSAGE: Patient requesting prescription for walker be sent to [facility] Phone: [phone].

## 2024-07-13 NOTE — Telephone Encounter (Signed)
 Call to [facility]   She is asking for a walker with a seat for her walks and when she needs to wait for the bus.    She states that because of her heart murmur she is needing a walker to feel safer     Has a disability because of heart condition     She talked to [facility] case manager - they advised talking to us  to get a prescription for a walker    Note to ARNP Keehn to advise

## 2024-07-19 NOTE — Telephone Encounter (Signed)
 Pt calling to know if this Holly Blair) prescription has been sent.

## 2024-07-25 NOTE — Addendum Note (Signed)
 Encounter addended by: Robinson Kast, RN on: 07/25/2024 1:50 PM   Actions taken: Letter saved

## 2024-07-26 NOTE — Telephone Encounter (Signed)
 RN faxed DME order, demographic sheet, last PCP OV note to [facility]) via rightfax to fax#[phone]

## 2024-07-31 ENCOUNTER — Encounter (HOSPITAL_BASED_OUTPATIENT_CLINIC_OR_DEPARTMENT_OTHER): Admitting: "Endocrinology

## 2024-09-03 ENCOUNTER — Encounter (HOSPITAL_BASED_OUTPATIENT_CLINIC_OR_DEPARTMENT_OTHER): Admitting: "Endocrinology

## 2024-10-17 ENCOUNTER — Ambulatory Visit: Admitting: Ophthalmology

## 2024-10-17 DIAGNOSIS — I351 Nonrheumatic aortic (valve) insufficiency: Secondary | ICD-10-CM

## 2024-11-27 ENCOUNTER — Ambulatory Visit: Admitting: Cardiovascular Disease
